# Patient Record
Sex: Female | Born: 1975
Health system: Southern US, Community
[De-identification: ages and names within clinical notes are randomized; demographics above are authoritative.]

## PROBLEM LIST (undated history)

## (undated) DIAGNOSIS — K5900 Constipation, unspecified: Secondary | ICD-10-CM

## (undated) DIAGNOSIS — L0390 Cellulitis, unspecified: Secondary | ICD-10-CM

## (undated) DIAGNOSIS — M2550 Pain in unspecified joint: Secondary | ICD-10-CM

## (undated) DIAGNOSIS — M549 Dorsalgia, unspecified: Secondary | ICD-10-CM

## (undated) DIAGNOSIS — I10 Essential (primary) hypertension: Secondary | ICD-10-CM

## (undated) DIAGNOSIS — E7800 Pure hypercholesterolemia, unspecified: Secondary | ICD-10-CM

## (undated) DIAGNOSIS — E663 Overweight: Secondary | ICD-10-CM

## (undated) DIAGNOSIS — F419 Anxiety disorder, unspecified: Secondary | ICD-10-CM

## (undated) HISTORY — DX: Pain in unspecified joint: M25.50

## (undated) HISTORY — DX: Overweight: E66.3

## (undated) HISTORY — DX: Dorsalgia, unspecified: M54.9

## (undated) HISTORY — DX: Constipation, unspecified: K59.00

## (undated) HISTORY — DX: Pure hypercholesterolemia, unspecified: E78.00

## (undated) HISTORY — PX: WISDOM TOOTH EXTRACTION: SHX21

---

## 1999-03-02 ENCOUNTER — Emergency Department (HOSPITAL_COMMUNITY): Admission: EM | Admit: 1999-03-02 | Discharge: 1999-03-02 | Payer: Self-pay | Admitting: Emergency Medicine

## 1999-10-09 ENCOUNTER — Other Ambulatory Visit: Admission: RE | Admit: 1999-10-09 | Discharge: 1999-10-09 | Payer: Self-pay | Admitting: Obstetrics and Gynecology

## 2000-02-03 ENCOUNTER — Other Ambulatory Visit: Admission: RE | Admit: 2000-02-03 | Discharge: 2000-02-03 | Payer: Self-pay | Admitting: *Deleted

## 2000-07-23 ENCOUNTER — Other Ambulatory Visit: Admission: RE | Admit: 2000-07-23 | Discharge: 2000-07-23 | Payer: Self-pay | Admitting: Obstetrics and Gynecology

## 2000-09-14 ENCOUNTER — Emergency Department (HOSPITAL_COMMUNITY): Admission: EM | Admit: 2000-09-14 | Discharge: 2000-09-14 | Payer: Self-pay | Admitting: Emergency Medicine

## 2000-09-14 ENCOUNTER — Encounter: Payer: Self-pay | Admitting: Emergency Medicine

## 2000-12-07 ENCOUNTER — Other Ambulatory Visit: Admission: RE | Admit: 2000-12-07 | Discharge: 2000-12-07 | Payer: Self-pay | Admitting: Obstetrics and Gynecology

## 2001-07-04 ENCOUNTER — Encounter: Payer: Self-pay | Admitting: Obstetrics and Gynecology

## 2001-07-04 ENCOUNTER — Ambulatory Visit (HOSPITAL_COMMUNITY): Admission: RE | Admit: 2001-07-04 | Discharge: 2001-07-04 | Payer: Self-pay | Admitting: Obstetrics and Gynecology

## 2001-07-07 ENCOUNTER — Inpatient Hospital Stay (HOSPITAL_COMMUNITY): Admission: AD | Admit: 2001-07-07 | Discharge: 2001-07-07 | Payer: Self-pay | Admitting: Obstetrics and Gynecology

## 2001-07-08 ENCOUNTER — Inpatient Hospital Stay (HOSPITAL_COMMUNITY): Admission: AD | Admit: 2001-07-08 | Discharge: 2001-07-08 | Payer: Self-pay | Admitting: Obstetrics and Gynecology

## 2001-07-11 ENCOUNTER — Inpatient Hospital Stay (HOSPITAL_COMMUNITY): Admission: AD | Admit: 2001-07-11 | Discharge: 2001-07-15 | Payer: Self-pay | Admitting: Obstetrics and Gynecology

## 2001-07-11 ENCOUNTER — Encounter (INDEPENDENT_AMBULATORY_CARE_PROVIDER_SITE_OTHER): Payer: Self-pay

## 2001-08-10 ENCOUNTER — Other Ambulatory Visit: Admission: RE | Admit: 2001-08-10 | Discharge: 2001-08-10 | Payer: Self-pay | Admitting: Obstetrics and Gynecology

## 2001-12-15 ENCOUNTER — Encounter: Admission: RE | Admit: 2001-12-15 | Discharge: 2001-12-15 | Payer: Self-pay | Admitting: Internal Medicine

## 2001-12-15 ENCOUNTER — Encounter: Payer: Self-pay | Admitting: Internal Medicine

## 2002-09-08 ENCOUNTER — Other Ambulatory Visit: Admission: RE | Admit: 2002-09-08 | Discharge: 2002-09-08 | Payer: Self-pay | Admitting: Obstetrics and Gynecology

## 2002-09-22 ENCOUNTER — Emergency Department (HOSPITAL_COMMUNITY): Admission: EM | Admit: 2002-09-22 | Discharge: 2002-09-23 | Payer: Self-pay | Admitting: Emergency Medicine

## 2002-09-23 ENCOUNTER — Encounter: Payer: Self-pay | Admitting: Emergency Medicine

## 2003-10-17 ENCOUNTER — Other Ambulatory Visit: Admission: RE | Admit: 2003-10-17 | Discharge: 2003-10-17 | Payer: Self-pay | Admitting: Obstetrics and Gynecology

## 2004-02-14 ENCOUNTER — Ambulatory Visit: Payer: Self-pay | Admitting: Family Medicine

## 2005-03-16 ENCOUNTER — Other Ambulatory Visit: Admission: RE | Admit: 2005-03-16 | Discharge: 2005-03-16 | Payer: Self-pay | Admitting: Obstetrics and Gynecology

## 2005-06-02 ENCOUNTER — Ambulatory Visit: Payer: Self-pay | Admitting: Family Medicine

## 2005-11-20 ENCOUNTER — Ambulatory Visit: Payer: Self-pay | Admitting: Family Medicine

## 2005-12-10 ENCOUNTER — Inpatient Hospital Stay (HOSPITAL_COMMUNITY): Admission: AD | Admit: 2005-12-10 | Discharge: 2005-12-10 | Payer: Self-pay | Admitting: Obstetrics and Gynecology

## 2006-10-25 ENCOUNTER — Inpatient Hospital Stay (HOSPITAL_COMMUNITY): Admission: AD | Admit: 2006-10-25 | Discharge: 2006-10-25 | Payer: Self-pay | Admitting: Obstetrics and Gynecology

## 2006-11-17 ENCOUNTER — Inpatient Hospital Stay (HOSPITAL_COMMUNITY): Admission: AD | Admit: 2006-11-17 | Discharge: 2006-11-17 | Payer: Self-pay | Admitting: Obstetrics and Gynecology

## 2006-11-19 ENCOUNTER — Inpatient Hospital Stay (HOSPITAL_COMMUNITY): Admission: AD | Admit: 2006-11-19 | Discharge: 2006-11-19 | Payer: Self-pay | Admitting: Obstetrics and Gynecology

## 2006-11-26 ENCOUNTER — Inpatient Hospital Stay (HOSPITAL_COMMUNITY): Admission: AD | Admit: 2006-11-26 | Discharge: 2006-11-26 | Payer: Self-pay | Admitting: Obstetrics and Gynecology

## 2006-11-30 ENCOUNTER — Inpatient Hospital Stay (HOSPITAL_COMMUNITY): Admission: AD | Admit: 2006-11-30 | Discharge: 2006-12-03 | Payer: Self-pay | Admitting: Obstetrics and Gynecology

## 2006-11-30 ENCOUNTER — Encounter (INDEPENDENT_AMBULATORY_CARE_PROVIDER_SITE_OTHER): Payer: Self-pay | Admitting: Obstetrics and Gynecology

## 2007-04-09 ENCOUNTER — Emergency Department (HOSPITAL_COMMUNITY): Admission: EM | Admit: 2007-04-09 | Discharge: 2007-04-09 | Payer: Self-pay | Admitting: Emergency Medicine

## 2007-12-08 ENCOUNTER — Emergency Department (HOSPITAL_BASED_OUTPATIENT_CLINIC_OR_DEPARTMENT_OTHER): Admission: EM | Admit: 2007-12-08 | Discharge: 2007-12-09 | Payer: Self-pay | Admitting: Emergency Medicine

## 2008-03-26 ENCOUNTER — Emergency Department (HOSPITAL_COMMUNITY): Admission: EM | Admit: 2008-03-26 | Discharge: 2008-03-26 | Payer: Self-pay | Admitting: Emergency Medicine

## 2009-06-04 ENCOUNTER — Emergency Department (HOSPITAL_BASED_OUTPATIENT_CLINIC_OR_DEPARTMENT_OTHER): Admission: EM | Admit: 2009-06-04 | Discharge: 2009-06-04 | Payer: Self-pay | Admitting: Emergency Medicine

## 2009-08-21 ENCOUNTER — Ambulatory Visit: Payer: Self-pay | Admitting: Obstetrics and Gynecology

## 2009-08-21 ENCOUNTER — Inpatient Hospital Stay (HOSPITAL_COMMUNITY): Admission: AD | Admit: 2009-08-21 | Discharge: 2009-08-21 | Payer: Self-pay | Admitting: Obstetrics and Gynecology

## 2009-11-19 ENCOUNTER — Observation Stay (HOSPITAL_COMMUNITY): Admission: AD | Admit: 2009-11-19 | Discharge: 2009-11-20 | Payer: Self-pay | Admitting: Obstetrics and Gynecology

## 2010-01-29 LAB — CBC
HCT: 33.3 % — ABNORMAL LOW (ref 36.0–46.0)
Hemoglobin: 10.8 g/dL — ABNORMAL LOW (ref 12.0–15.0)
MCH: 27.2 pg (ref 26.0–34.0)
MCHC: 32.4 g/dL (ref 30.0–36.0)
MCV: 83.9 fL (ref 78.0–100.0)
Platelets: 306 10*3/uL (ref 150–400)
RBC: 3.97 MIL/uL (ref 3.87–5.11)
RDW: 14.7 % (ref 11.5–15.5)
WBC: 8.1 10*3/uL (ref 4.0–10.5)

## 2010-01-29 LAB — SURGICAL PCR SCREEN
MRSA, PCR: NEGATIVE
Staphylococcus aureus: POSITIVE — AB

## 2010-01-29 LAB — RPR: RPR Ser Ql: NONREACTIVE

## 2010-02-04 ENCOUNTER — Inpatient Hospital Stay (HOSPITAL_COMMUNITY)
Admission: RE | Admit: 2010-02-04 | Discharge: 2010-02-07 | Payer: Self-pay | Source: Home / Self Care | Attending: Obstetrics and Gynecology | Admitting: Obstetrics and Gynecology

## 2010-02-05 LAB — CBC
HCT: 28.3 % — ABNORMAL LOW (ref 36.0–46.0)
Hemoglobin: 9.2 g/dL — ABNORMAL LOW (ref 12.0–15.0)
MCH: 26.9 pg (ref 26.0–34.0)
MCHC: 32.5 g/dL (ref 30.0–36.0)
MCV: 82.7 fL (ref 78.0–100.0)
Platelets: 273 10*3/uL (ref 150–400)
RBC: 3.42 MIL/uL — ABNORMAL LOW (ref 3.87–5.11)
RDW: 14.8 % (ref 11.5–15.5)
WBC: 9.7 10*3/uL (ref 4.0–10.5)

## 2010-02-05 LAB — TYPE AND SCREEN
ABO/RH(D): A POS
Antibody Screen: NEGATIVE

## 2010-02-05 LAB — ABO/RH: ABO/RH(D): A POS

## 2010-02-10 NOTE — Op Note (Signed)
  NAMEIVIE, SAVITT            ACCOUNT NO.:  [id number]  MEDICAL RECORD NO.:  [id number]          PATIENT TYPE:  INP  LOCATION:  9144                          FACILITY:  [facility]  PHYSICIAN:  Lenea Bywater L. Savahanna Almendariz, M.D.DATE OF BIRTH:  11-Jan-1976  DATE OF PROCEDURE:  02/04/2010 DATE OF DISCHARGE:                              OPERATIVE REPORT   PREOPERATIVE DIAGNOSES:  Intrauterine pregnancy at 39 weeks, previous C- section x2.  POSTOPERATIVE DIAGNOSES:  Intrauterine pregnancy at 39 weeks, previous C- section x2.  PROCEDURE:  Repeat low transverse cesarean section.  SURGEON:  Yovany Clock L. Lenard Kampf, MD  ANESTHESIA:  Spinal.  FINDINGS:  Female infant, Apgar 6 at 1 minute, 8 at 5 minutes, and 9 at 10 minutes.  SPECIMENS:  None.  PATHOLOGY:  None.  ESTIMATED BLOOD LOSS:  500 mL.  COMPLICATIONS:  None.  PROCEDURE:  The patient was taken to the operating room where spinal was placed.  She was then prepped and draped.  A Foley catheter was inserted.  A low transverse incision was made, carried down to the fascia.  Fascia was scored in the midline and extended laterally.  The rectus muscles were separated in midline.  The peritoneum was entered bluntly.  The peritoneal incision was then stretched.  Of note, there was significant scarring noted in the fascia and rectus muscles from her previous C-section, so she did not have any adhesions involving the uterus.  The bladder blade was inserted.  The lower uterine segment was identified and a bladder flap was created sharply and then digitally.  A low transverse incision was made in the uterus.  Uterus was entered using a hemostat.  The baby was in cephalic presentation.  Amniotomy revealed clear amniotic fluid.  The baby was delivered easily without any difficulty with a female infant, Apgars 6 at 1 minute, 8 at 5 minutes, and 9 at 10 minutes.  The baby was handed to the awaiting neonatal team and taken to the newborn nursery.  The uterus  was exteriorized.  It was cleared of all clots and debris after the placenta was manually removed and noted to be normal intact with a 3-vessel cord.  Antibiotics and Pitocin were given.  Uterus was then closed in 2 layers using O chromic in a running locked stitch.  The uterus was returned to the abdomen. Irrigation was performed.  Hemostasis was excellent.  The peritoneum was reapproximated using 0 Vicryl.  The fascia was then closed using 0 Vicryl and running stitch starting each corner meeting in the midline with a running stitch.  After irrigation of subcutaneous layer, the skin was closed with staples.  All sponge, lap, and instrument counts were correct x2.  The patient went to recovery room in stable condition.     Romero Letizia L. Vincente Poli, M.D.     Florestine Avers  D:  02/04/2010  T:  02/04/2010  Job:  [id number]  Electronically Signed by Marcelle Overlie M.D. on 02/10/2010 02:24:43 PM

## 2010-03-05 NOTE — Discharge Summary (Signed)
  NAMEJERRA, Diane [name]            ACCOUNT NO.:  [id number]  MEDICAL RECORD NO.:  [id number]        PATIENT TYPE:  WINP  LOCATION:                                FACILITY:  [facility]  PHYSICIAN:  Julio Sicks, N.P.     DATE OF BIRTH:  May 02, 1975  DATE OF ADMISSION:  02/04/2010 DATE OF DISCHARGE:  02/07/2010                              DISCHARGE SUMMARY   ADMITTING DIAGNOSES: 1. Intrauterine pregnancy at 42 weeks' estimated gestational age. 2. Previous cesarean section x2, desires repeat.  DISCHARGE DIAGNOSES: 1. Status post low transverse cesarean section. 2. Viable female infant.  PROCEDURE:  Repeat low transverse cesarean section.  REASON FOR ADMISSION:  Please see written H and P.  HOSPITAL COURSE:  The patient is a 35 year old gravida 6, para 2 that was admitted to [facility] for scheduled cesarean section.  The patient had had 2 previous cesarean deliveries and desired repeat.  Her obstetrical history had been uncomplicated.  On the morning of admission, the patient was taken to the operating room where spinal anesthesia was administered without difficulty.  A low transverse incision was made with delivery of a viable female infant, weighing 6 pounds and 9 ounces, with Apgars of 6 at 1 minute and 8 at 5 minutes. The patient tolerated the procedure well and was taken to the recovery room in stable condition.  On postoperative day #1, the patient was without complaint.  Vital signs were stable.  Abdomen was soft.  Fundus was firm and nontender.  Small amount of drainage was noted on the bandage.  Foley had been discontinued.  She was voiding well. Laboratory findings showed hemoglobin of 9.2, platelet count of 273,000. On postoperative day #2, the patient was without complaint.  Vital signs were stable.  Abdomen was soft.  Fundus was firm and nontender. Incision was clean, dry, and intact.  Staples were intact.  She was ambulating well and tolerating a regular  diet.  On postoperative day #3, the patient was without complaint.  Vital signs were stable.  Abdomen was soft.  Fundus was firm and nontender.  Incision was clean, dry, and intact.  Staples were removed.  The patient was later discharged home.  CONDITION ON DISCHARGE:  Stable.  DIET:  Regular as tolerated.  ACTIVITY:  No heavy lifting, no driving x2 weeks, and no vaginal entry.  FOLLOWUP:  The patient is to follow up in the office in 1-2 weeks for an incision check.  She is to call for temperature greater than 100 degrees, persistent nausea, vomiting, heavy vaginal bleeding, and/or redness or drainage from an incisional site.  DISCHARGE MEDICATIONS: 1. Tylox #30 one p.o. every 4-6 hours p.r.n. 2. Motrin 600 mg every 6 hours. 3. Prenatal vitamins 1 p.o. daily. 4. Colace 1 p.o. daily.     Julio Sicks, N.P.     CC/MEDQ  D:  02/07/2010  T:  02/07/2010  Job:  [id number]  Electronically Signed by Julio Sicks N.P. on 02/11/2010 04:01:09 PM Electronically Signed by Richarda Overlie M.D. on 03/05/2010 06:39:02 PM

## 2010-03-25 LAB — CBC
HCT: 32.4 % — ABNORMAL LOW (ref 36.0–46.0)
Hemoglobin: 10.9 g/dL — ABNORMAL LOW (ref 12.0–15.0)
MCH: 29.8 pg (ref 26.0–34.0)
MCHC: 33.7 g/dL (ref 30.0–36.0)
MCV: 88.5 fL (ref 78.0–100.0)
Platelets: 335 10*3/uL (ref 150–400)
RBC: 3.66 MIL/uL — ABNORMAL LOW (ref 3.87–5.11)
RDW: 14 % (ref 11.5–15.5)
WBC: 8.6 10*3/uL (ref 4.0–10.5)

## 2010-03-28 LAB — URINALYSIS, ROUTINE W REFLEX MICROSCOPIC
Bilirubin Urine: NEGATIVE
Glucose, UA: NEGATIVE mg/dL
Hgb urine dipstick: NEGATIVE
Ketones, ur: NEGATIVE mg/dL
Nitrite: NEGATIVE
Protein, ur: NEGATIVE mg/dL
Specific Gravity, Urine: 1.025 (ref 1.005–1.030)
Urobilinogen, UA: 0.2 mg/dL (ref 0.0–1.0)
pH: 6 (ref 5.0–8.0)

## 2010-04-08 ENCOUNTER — Emergency Department (HOSPITAL_BASED_OUTPATIENT_CLINIC_OR_DEPARTMENT_OTHER)
Admission: EM | Admit: 2010-04-08 | Discharge: 2010-04-08 | Disposition: A | Discharge status: Home / Self Care | Payer: Medicaid Other | Attending: Emergency Medicine | Admitting: Emergency Medicine

## 2010-04-08 DIAGNOSIS — H5789 Other specified disorders of eye and adnexa: Secondary | ICD-10-CM | POA: Insufficient documentation

## 2010-04-08 DIAGNOSIS — H109 Unspecified conjunctivitis: Secondary | ICD-10-CM | POA: Insufficient documentation

## 2010-05-27 NOTE — Op Note (Signed)
Diane [name], [name]            ACCOUNT NO.:  [id number]   MEDICAL RECORD NO.:  [id number]          PATIENT TYPE:  INP   LOCATION:  9105                          FACILITY:  [facility]   PHYSICIAN:  Guy Sandifer. Henderson Cloud, M.D. DATE OF BIRTH:  September 10, 1975   DATE OF PROCEDURE:  11/30/2006  DATE OF DISCHARGE:                               OPERATIVE REPORT   PREOPERATIVE DIAGNOSES:  1. Intrauterine pregnancy at 38-5/7 weeks estimated gestational age.  2. Pregnancy-induced hypertension.  3. Previous cesarean section, desires repeat.   POSTOPERATIVE DIAGNOSES:  1. Intrauterine pregnancy at 38-5/7 weeks estimated gestational age.  2. Pregnancy-induced hypertension.  3. Previous cesarean section, desires repeat.   PROCEDURE:  Repeat low transverse cesarean section.   SURGEON:  Harold Hedge, MD   ANESTHESIA:  Queen Blossom, MD.   ESTIMATED BLOOD LOSS:  800 mL.   FINDINGS:  A viable female infant, Apgars of 9 and 9 at one and five  minutes respectively.  Birth weight 8 pounds 0 ounces.  Arterial cord pH  7.23.   INDICATIONS AND CONSENT:  This patient is a 35 year old black female G2,  P1 at 38-5/7 weeks with elevated blood pressures in the 140s-150s/90s-  100.  She has a mild headache.  Laboratories were within normal limits.  She desires repeat cesarean section.  Delivery is recommended.  Potential risks and complications were discussed preoperatively  including but limited to infection, organ damage, bleeding with  transfusion of blood products with HIV and hepatitis acquisition, DVT,  PE, pneumonia.  All questions have been answered and consent is signed  and on the chart.   PROCEDURE:  The patient is taken to the operating room where she is  identified, a spinal anesthetic is placed and she is placed in the  dorsosupine position with a 15 degree left lateral wedge.  The  panniculus was elevated with tape.  The patient is then prepped, Foley  catheter is placed and the bladder is  drained in straight sterile  fashion.  After testing for adequate spinal anesthesia skin is entered  through a Pfannenstiel incision and dissection is carried out in layers  to the peritoneum.  There are adhesions of the omentum to the anterior  abdominal wall which are easily taken down and superior to the area of  surgery.   The vesicouterine peritoneum was then taken down cephalad laterally.  The bladder flap was developed and the bladder blade was placed.  The  uterus is incised in a low transverse manner.  The uterine cavity is  entered bluntly with a hemostat.  The uterine incision is extended  cephalad laterally with fingers.  Artificial rupture of membranes for  clear fluid is then carried out.  Vertex is delivered without difficulty  and the oral and nasopharynx were suctioned.  The remainder of the baby  is delivered without difficulty.  Good cry and tone is noted.  Cord is  clamped and cut and the baby is handed to the waiting pediatrics team.   The placenta is then manually extracted and sent to pathology.  The  uterine cavity is clean.  The uterus is  closed in two running locking  imbricating layers of zero Monocryl suture which achieves good  hemostasis.  Tubes and ovaries palpate normally bilaterally.  There is a  closed loop formed by a narrow adhesion of the omentum to the anterior  peritoneum.  This was taken down with cautery well clear of the bowel.  Good hemostasis was noted.  Irrigation is carried out.  The anterior  peritoneum was closed in running fashion with zero Monocryl suture which  was also used to reapproximate the pyramidalis muscle in the midline.  The anterior rectus fascia is closed in a running fashion with zero PDS  suture.  A JP drain was then placed in the subcutaneous tissues and  exited lateral to the left angle of the incision.  It is sewn into place  with a 2-0 silk suture.  The skin is closed with clips.  A pressure  dressing is applied.  All  sponge, instrument and needle counts were  correct and the patient is transferred to the recovery room in stable  condition.      Guy Sandifer Henderson Cloud, M.D.  Electronically Signed     JET/MEDQ  D:  11/30/2006  T:  12/01/2006  Job:  [id number]

## 2010-05-30 NOTE — H&P (Signed)
[facility]  Patient:    Diane [name], Diane [name] Visit Number: [id number] MRN: [id number]          Service Type: OBS Location: 910B 9158 01 Attending Physician:  Rhina Brackett Dictated by:   Duke Salvia. Marcelle Overlie, M.D. Admit Date:  07/11/2001                           History and Physical  REASON FOR ADMISSION:         For labor induction secondary to PIH.  HISTORY OF PRESENT ILLNESS:   A 35 year old G2 P0; EDD is July 13, 2001; presents at term for labor induction.  She was scheduled for later this week but presented to the office today complaining of headache and some mild epigastric discomfort, no other visual changes, and was noted to have significant lower extremity edema with BP 140/100, and is admitted for induction.  LABORATORY DATA:              A positive.  Rubella titer immune.  Group B strep screen was positive.  One-hour GTT 74.  PAST MEDICAL HISTORY:  ALLERGIES:                    None.  OPERATIONS:                   TAB.  REVIEW OF SYSTEMS:            She had treated GC at age 3.  PHYSICAL EXAMINATION:  VITAL SIGNS:                  Temperature 98.2, blood pressure 140/100, weight 272.  HEENT:                        Unremarkable.  NECK:                         Supple without masses.  LUNGS:                        Clear.  CARDIOVASCULAR:               Regular rate and rhythm without murmurs, rubs, or gallops noted.  BREASTS:                      Not examined.  ABDOMEN:                      Term fundal height.  Fetal heart rate 140.  EXTREMITIES:                  Revealed 2+ lower extremity edema.  Reflexes were 1-2+, no clonus.  PELVIC:                       The cervix was 1 cm, 25-50%, membranes intact, vertex.  IMPRESSION:                   1. Term intrauterine pregnancy.                               2. Mild pregnancy-induced hypertension.  PLAN:  Will admit for two-stage labor  induction.  Will recheck lab data, continuous fetal monitoring, and treat with ampicillin in labor. Dictated by:   Duke Salvia. Marcelle Overlie, M.D. Attending Physician:  Rhina Brackett DD:  07/11/01 TD:  07/11/01 Job: [id number]

## 2010-05-30 NOTE — Discharge Summary (Signed)
Diane [name], Diane [name]            ACCOUNT NO.:  [id number]   MEDICAL RECORD NO.:  [id number]          PATIENT TYPE:  INP   LOCATION:  9105                          [facility]AC[facility]   PHYSICIAN:  Juluis Mire, M.D.   DATE OF BIRTH:  [birth year]   DATE OF ADMISSION:  11/30/2006  DATE OF DISCHARGE:  12/03/2006                               DISCHARGE SUMMARY   ADMITTING DIAGNOSES:  1. Intrauterine pregnancy at 38-5/7 weeks estimated gestational age.  2. Previous cesarean section, desires repeat.   DISCHARGE DIAGNOSES:  1. Status post low transverse cesarean section.  2. A viable female infant.   PROCEDURE:  Repeat low transverse cesarean section.   REASON FOR ADMISSION:  Please see written H&P.   HOSPITAL COURSE:  The patient is a 35 year old black married female  gravida 2, para 1 that was admitted to [facility] at 38-  5/7 weeks estimated gestational age for scheduled cesarean section.  The  patient had had a previous cesarean delivery and desired repeat.  On the  morning of admission the patient was taken to the operating room where  spinal anesthesia was administered without difficulty.  A low transverse  incision was made with delivery of a viable female infant weighing 8  pounds 2 ounces, Apgars of 9 at one minute and 9 at five minutes.  The  patient tolerated the procedure well and was taken to the recovery room  in stable condition.   Postoperative day #1 the patient did complain of some itching.  Vital  signs were stable.  She was afebrile.  Blood pressure was 121/67 and  121/75.  Abdomen soft with good return of bowel function.  Fundus was  firm and nontender.  Abdominal dressing was noted to be clean, dry and  intact.  The patient had had a Jackson-Pratt that was inserted during  the delivery at the incisional site with less than 5 mL noted in the  drainage bottle.  Foley was draining with adequate amount of urine  output.   Laboratories revealed  hemoglobin of 9.5, platelet count of 259,000, WBC  count of 9.9.  On postoperative day #2 the patient did complain of some  productive cough which was clear.  Vital signs were stable.  She was  afebrile.  Lungs were clear to auscultation.  Abdomen soft.  Fundus firm  and nontender.  Incision was clean, dry and intact.  Jackson-Pratt  remained.  She was voiding well and ambulating well.  The patient was  started on Tessalon Perles for a productive cough.   On postoperative day #3 the patient was without complaints.  Vital signs  were stable.  She was afebrile.  Fundus firm and nontender.  Incision  was clean, dry and intact.  Jackson-Pratt was removed.  Discharge  instructions were given and the patient was later discharged home.   CONDITION ON DISCHARGE:  Stable.   DIET:  Regular as tolerated.   ACTIVITY:  No heavy lifting, no driving x2 weeks, no vaginal entry.   FOLLOW UP:  The patient to follow up in the office in 1 week for an  incision check.  She is to call for temperature greater than 100  degrees, persistent nausea or vomiting, heavy vaginal bleeding and/or  redness or drainage from incisional site.   DISCHARGE MEDICATION:  1. Tylox #30 one p.o. every 4-6 hours p.r.n.  2. Motrin 600 mg every 6 hours.  3. Prenatal vitamins one p.o. daily.  4. Colace one p.o. daily p.r.n.      Julio Sicks, N.P.      Juluis Mire, M.D.  Electronically Signed    CC/MEDQ  D:  12/29/2006  T:  12/29/2006  Job:  [id number]

## 2010-05-30 NOTE — Op Note (Signed)
[facility]  Patient:    Diane [name], Diane [name] Visit Number: [id number] MRN: [id number]          Service Type: OBS Location: 910A 9113 01 Attending Physician:  Rhina Brackett Dictated by:   Willey Blade, M.D. Proc. Date: 07/12/01 Admit Date:  07/11/2001                             Operative Report  PREOPERATIVE DIAGNOSES:       1. Intrauterine pregnancy at term.                               2. Cephalopelvic disproportion.  POSTOPERATIVE DIAGNOSES:      1. Intrauterine pregnancy at term.                               2. Cephalopelvic disproportion.  PROCEDURE:                    Primary low transverse cesarean section.  SURGEON:                      Willey Blade, M.D.  ANESTHESIA:                   Epidural.  EBL:                          800 cc.  COMPLICATIONS:                None.  FINDINGS:                     At 2009 h through a low transverse uterine incision, a viable female infant was delivered from the vertex presentation. Apgars were 8 and 9.  The baby did well.  The baby was a female weighing 7 pounds 3 ounces.  A posterior pedunculated uterine fibroid was removed; this was approximately 4 cm in size and in the middle of the posterior aspect of the uterus.  Due to its pedunculated nature, I did remove this.  The adnexa was visualized at time of surgery and noted to be normal.  DESCRIPTION OF PROCEDURE:     The patient was taken to the operating room, where an adequate level of epidural anesthetic was administered.  The patient was placed on the operating table in the left lateral tilt position.  The abdomen was prepped and draped in the usual sterile fashion with Betadine and sterile drapes.  A Foley catheter had previously been inserted.  The skin was numbed with approximately 18 cc of 0.25% Marcaine and the abdomen was then entered through a low transverse incision sharply and in the usual fashion. The peritoneum was  atraumatically entered.  The vesicouterine peritoneum overlying the lower uterine segment was incised, and a bladder flap was bluntly and sharply created over the lower uterine segment.  A bladder blade was then placed behind the bladder to ensure its protection during the procedure.  The uterus was then entered through a low transverse incision, and carried out laterally using the operators fingers.  The vertex was elevated into the incision and delivered promptly and easily at 2209.  The oronasopharynx was slowly bulb suctioned and the cord doubly clamped and cut.  The baby was handed promptly to the pediatricians. The baby was a female infant weighing 7 pounds 3 ounces.  The baby did well. Apgars were 8 and 9, as mentioned.  The baby did well.  The placenta was then manually extracted intact with three-vessel cord without difficulty.  The interior of the uterus was wiped clean thoroughly with a wet sponge.  The uterine incision was then closed in a two-layer fashion; the first layer a running interlocking suture of #1 Vicryl, a second embrocating suture was placed across the primary suture line with a running stitch of #1 Vicryl as well.  The pelvis was then thoroughly irrigated with copious amounts of irrigant.  Attention was then turned to a posterior fibroid.  There was noted a pedunculated posterior uterine fibroid, approximately 4 cm in size.  This was excised at its base with a bipolar cautery, and the base was then sutured with three interrupted sutures of #1 Vicryl.  This was excised due to its pedunculated nature.  The adnexa was visualized and noted to be normal.  The pelvis was then thoroughly irrigated once again, and thoroughly inspected, with hemostasis noted.  The rectus muscle and anterior peritoneum was then closed with a running stitch of #1 Vicryl.  The subfascial areas were hemostatic.  The fascia was then closed with two sutures of #1 Vicryl in a running fashion.   The subcutaneous tissue was irrigated and made hemostatic using the Bovie cautery.  The skin was reapproximated with staples and a sterile dressing applied.  The patient did receive antibiotic after the cord was clamped.  Final sponge, needle, and instrument count was correct x3.  There were no preoperative complications. Dictated by:   Willey Blade, M.D. Attending Physician:  Rhina Brackett DD:  07/12/01 TD:  07/15/01 Job: [id number]

## 2010-05-30 NOTE — Discharge Summary (Signed)
Diane [name], Diane [name]                      ACCOUNT NO.:  [id number]   MEDICAL RECORD NO.:  [id number]                   PATIENT TYPE:  INP   LOCATION:  9113                                 FACILITY:  [facility]   PHYSICIAN:  Juluis Mire, M.D.                DATE OF BIRTH:  1975-04-30   DATE OF ADMISSION:  07/11/2001  DATE OF DISCHARGE:  07/15/2001                                 DISCHARGE SUMMARY   ADMISSION DIAGNOSES:  1. Intrauterine pregnancy at term.  2. Cephalopelvic disproportion.   DISCHARGE DIAGNOSES:  1. Status post low transverse cesarean section.  2. Viable female infant.  3. Removal of pedunculated uterine fibroid.   PROCEDURE:  Primary low transverse cesarean section.   REASON FOR ADMISSION:  Please see written H&P.   HOSPITAL COURSE:  The patient was admitted for labor induction due to  pregnancy-induced hypertension.  The patient had presented to the office  with complaints of  headache, mild epigastric discomfort.  The patient was  also noted to have significant lower extremity edema and blood pressure of  140/100.  The patient was admitted to the hospital.  Fetal monitor was  placed for fetal surveillance.  PIH labs were obtained.  Episodes of  variable decelerations were noted on fetal monitor strip.  Contractions were  every 3-4 minutes.  Plan for Cytotec was cancelled.  IV antibiotics  were  administered for positive group B-beta strep culture.  On the following  morning, intrauterine pressure catheter was placed.  Fetal heart tones were  reassuring.  Although the uterine pressure catheter noted adequate labor,  cervix never progressed past 5 cm dilated.  Decision was made to proceed  with a cesarean delivery due to cephalopelvic disproportion.  The patient  was taken to the operating room where epidural anesthesia was dosed to  adequate surgical level.  A low transverse incision was made with the  delivery of a viable female infant weighing 7 pounds 3 ounces  with Apgars of 8  at one minute and 9 at five minutes.  The patient was noted to have a  pedunculated fibroid which was removed without difficulty.  The patient  tolerated the procedure well and was taken to the recovery room in stable  condition.  On postoperative day #1, the patient had good return of bowel  function.  Abdominal dressing was clean, dry, and intact.  Uterus was  nontender.  Labs revealed a hemoglobin of 9.9, hematocrit of 29.3, and WBC count of  17,000.  On postoperative #2, abdomen was soft.  Incision was clean, dry,  and intact.  The patient was ambulating without assistance and tolerating a  regular diet without complaints of nausea and vomiting.  On postoperative  day #3, fundus was firm.  Incision was clean, dry, and intact.  Staples were  removed.  The patient was discharged home.   CONDITION ON DISCHARGE:  Good.   DIET:  Regular as tolerated.   ACTIVITY:  No heavy lifting, no driving x2 weeks, no vaginal entry.   FOLLOW UP:  The patient is to follow up in the office in 1-2 weeks for an  incision check.  She is to call for temperature greater than 100 degrees,  persistent nausea or vomiting, heavy vaginal bleeding and/or redness or  drainage from the incision site.   DISCHARGE MEDICATIONS:  1. Tylox (#30) one to two every 4-6 hours p.r.n. pain.  2. Ibuprofen 600 mg every 6 hours p.r.n.  3. Prenatal vitamins one p.o. daily.        Judith Blonder, N.P.                     Juluis Mire, M.D.    CGC/MEDQ  D:  08/09/2001  T:  08/12/2001  Job:  [id number]

## 2010-10-14 LAB — DIFFERENTIAL
Basophils Absolute: 0.2 — ABNORMAL HIGH
Basophils Relative: 2 — ABNORMAL HIGH
Eosinophils Absolute: 0.1
Eosinophils Relative: 1
Lymphocytes Relative: 13
Lymphs Abs: 1.4
Monocytes Absolute: 0.6
Monocytes Relative: 6
Neutro Abs: 8.3 — ABNORMAL HIGH
Neutrophils Relative %: 79 — ABNORMAL HIGH

## 2010-10-14 LAB — BASIC METABOLIC PANEL
BUN: 9
CO2: 27
Calcium: 8.8
Chloride: 101
Creatinine, Ser: 0.6
GFR calc Af Amer: >60
GFR calc non Af Amer: >60
Glucose, Bld: 92
Potassium: 3.8
Sodium: 137

## 2010-10-14 LAB — HCG, QUANTITATIVE, PREGNANCY: hCG, Beta Chain, Quant, S: 1997 — ABNORMAL HIGH

## 2010-10-14 LAB — CBC
HCT: 35.9 — ABNORMAL LOW
Hemoglobin: 12
MCHC: 33.3
MCV: 83.7
Platelets: 304
RBC: 4.29
RDW: 14.8
WBC: 10.6 — ABNORMAL HIGH

## 2010-10-14 LAB — ABO/RH: ABO/RH(D): A POS

## 2010-10-21 LAB — CBC
HCT: 34.2 — ABNORMAL LOW
HCT: 33.1 — ABNORMAL LOW
HCT: 34.4 — ABNORMAL LOW
Hemoglobin: 11.5 — ABNORMAL LOW
Hemoglobin: 9.5 — ABNORMAL LOW
Hemoglobin: 11.4 — ABNORMAL LOW
Hemoglobin: 11.9 — ABNORMAL LOW
MCHC: 33.5
MCHC: 33.6
MCHC: 34.1
MCHC: 34.3
MCHC: 34.5
MCV: 87
MCV: 87.6
MCV: 85.8
MCV: 86.6
Platelets: 308
Platelets: 349
RBC: 3.22 — ABNORMAL LOW
RBC: 3.93
RBC: 3.86 — ABNORMAL LOW
RBC: 4.01
RDW: 14.3
RDW: 13.7
WBC: 7.7

## 2010-10-21 LAB — URINE MICROSCOPIC-ADD ON

## 2010-10-21 LAB — COMPREHENSIVE METABOLIC PANEL
ALT: 16
ALT: 16
AST: 22
AST: 20
AST: 21
Albumin: 2.3 — ABNORMAL LOW
Alkaline Phosphatase: 78
Alkaline Phosphatase: 87
BUN: 3 — ABNORMAL LOW
BUN: 1 — ABNORMAL LOW
BUN: 3 — ABNORMAL LOW
CO2: 24
CO2: 23
CO2: 25
CO2: 27
Calcium: 8.8
Calcium: 8.8
Calcium: 9
Calcium: 9.4
Chloride: 105
Creatinine, Ser: 0.59
Creatinine, Ser: 0.52
Creatinine, Ser: 0.54
GFR calc Af Amer: >60
GFR calc Af Amer: >60
GFR calc Af Amer: >60
GFR calc non Af Amer: >60
GFR calc non Af Amer: >60
GFR calc non Af Amer: >60
Glucose, Bld: 82
Glucose, Bld: 81
Glucose, Bld: 84
Potassium: 4.2
Potassium: 3.8
Sodium: 135
Sodium: 136
Total Bilirubin: 0.5
Total Protein: 6.4
Total Protein: 6.1
Total Protein: 6.7

## 2010-10-21 LAB — URIC ACID
Uric Acid, Serum: 5.2
Uric Acid, Serum: 4.9
Uric Acid, Serum: 5.2

## 2010-10-21 LAB — URINALYSIS, ROUTINE W REFLEX MICROSCOPIC
Glucose, UA: NEGATIVE
Nitrite: NEGATIVE
Protein, ur: 30 — AB
Urobilinogen, UA: 0.2

## 2010-10-21 LAB — LACTATE DEHYDROGENASE
LDH: 162
LDH: 154
LDH: 168

## 2010-10-23 LAB — URINALYSIS, ROUTINE W REFLEX MICROSCOPIC
Bilirubin Urine: NEGATIVE
Glucose, UA: NEGATIVE
Hgb urine dipstick: NEGATIVE
Specific Gravity, Urine: 1.015

## 2011-11-02 ENCOUNTER — Encounter: Payer: Self-pay | Admitting: Family

## 2011-11-02 ENCOUNTER — Ambulatory Visit (HOSPITAL_BASED_OUTPATIENT_CLINIC_OR_DEPARTMENT_OTHER)
Admission: RE | Admit: 2011-11-02 | Discharge: 2011-11-02 | Disposition: A | Discharge status: Home / Self Care | Payer: BC Managed Care – PPO | Source: Ambulatory Visit | Attending: Family | Admitting: Family

## 2011-11-02 ENCOUNTER — Ambulatory Visit (INDEPENDENT_AMBULATORY_CARE_PROVIDER_SITE_OTHER): Payer: BC Managed Care – PPO | Admitting: Family

## 2011-11-02 ENCOUNTER — Other Ambulatory Visit: Payer: Self-pay | Admitting: Family

## 2011-11-02 VITALS — BP 138/90 | HR 83 | Temp 98.5°F | Resp 18 | Ht 59.5 in | Wt 269.0 lb

## 2011-11-02 DIAGNOSIS — Z0000 Encounter for general adult medical examination without abnormal findings: Secondary | ICD-10-CM

## 2011-11-02 DIAGNOSIS — R51 Headache: Secondary | ICD-10-CM

## 2011-11-02 DIAGNOSIS — R5381 Other malaise: Secondary | ICD-10-CM

## 2011-11-02 DIAGNOSIS — Z1231 Encounter for screening mammogram for malignant neoplasm of breast: Secondary | ICD-10-CM | POA: Insufficient documentation

## 2011-11-02 DIAGNOSIS — R519 Headache, unspecified: Secondary | ICD-10-CM | POA: Insufficient documentation

## 2011-11-02 DIAGNOSIS — R030 Elevated blood-pressure reading, without diagnosis of hypertension: Secondary | ICD-10-CM

## 2011-11-02 DIAGNOSIS — G44309 Post-traumatic headache, unspecified, not intractable: Secondary | ICD-10-CM

## 2011-11-02 DIAGNOSIS — IMO0001 Reserved for inherently not codable concepts without codable children: Secondary | ICD-10-CM

## 2011-11-02 DIAGNOSIS — Z720 Tobacco use: Secondary | ICD-10-CM

## 2011-11-02 DIAGNOSIS — G4710 Hypersomnia, unspecified: Secondary | ICD-10-CM

## 2011-11-02 DIAGNOSIS — F17200 Nicotine dependence, unspecified, uncomplicated: Secondary | ICD-10-CM

## 2011-11-02 DIAGNOSIS — R5383 Other fatigue: Secondary | ICD-10-CM

## 2011-11-02 DIAGNOSIS — R400 Somnolence: Secondary | ICD-10-CM

## 2011-11-02 DIAGNOSIS — S0990XS Unspecified injury of head, sequela: Secondary | ICD-10-CM

## 2011-11-02 LAB — CBC WITH DIFFERENTIAL/PLATELET
Eosinophils Absolute: 0.1 10*3/uL (ref 0.0–0.7)
Eosinophils Relative: 1 % (ref 0–5)
HCT: 36.9 % (ref 36.0–46.0)
Hemoglobin: 11.8 g/dL — ABNORMAL LOW (ref 12.0–15.0)
Lymphocytes Relative: 21 % (ref 12–46)
Lymphs Abs: 1.3 10*3/uL (ref 0.7–4.0)
MCH: 25.1 pg — ABNORMAL LOW (ref 26.0–34.0)
MCV: 78.3 fL (ref 78.0–100.0)
Monocytes Absolute: 0.5 10*3/uL (ref 0.1–1.0)
Monocytes Relative: 7 % (ref 3–12)
RBC: 4.71 MIL/uL (ref 3.87–5.11)
WBC: 6.3 10*3/uL (ref 4.0–10.5)

## 2011-11-02 LAB — BASIC METABOLIC PANEL WITH GFR
BUN: 11 mg/dL (ref 6–23)
CO2: 27 mEq/L (ref 19–32)
Chloride: 104 mEq/L (ref 96–112)
GFR, Est Non African American: >89 mL/min
Glucose, Bld: 79 mg/dL (ref 70–99)
Potassium: 4.1 mEq/L (ref 3.5–5.3)

## 2011-11-02 LAB — LIPID PANEL
HDL: 46 mg/dL (ref >39)
LDL Cholesterol: 154 mg/dL — ABNORMAL HIGH (ref 0–99)

## 2011-11-02 LAB — HEPATIC FUNCTION PANEL
AST: 15 U/L (ref 0–37)
Alkaline Phosphatase: 49 U/L (ref 39–117)
Indirect Bilirubin: 0.2 mg/dL (ref 0.0–0.9)
Total Protein: 7.5 g/dL (ref 6.0–8.3)

## 2011-11-02 NOTE — Assessment & Plan Note (Signed)
We discussed some simple changes- stopping juice/soda, fried foods and increasing exercise.

## 2011-11-02 NOTE — Assessment & Plan Note (Signed)
BP is borderline.  Will monitor.  Stressed importance of weight loss, low sodium, exercise.

## 2011-11-02 NOTE — Progress Notes (Signed)
Subjective:    Patient ID: Diane [name], female    DOB: [birth year], 36 y.o.   MRN: [id number]  HPI  [name] is a 36 yr old female who presents today with chief complaint of fatigue.  She reports that her fatigue started after the birth of her son last year.  Son will be 2 in January.  She reports that she does not always sleep well.  Sometimes lays in bed and has trouble sleeping.  Worried about things.  She reports + daytime somnolence.  + snoring.  She denies fam hx of sleep apnea.    Reports heavy periods (+ hx of fibroids)  Period last 5 days.  + clots- 5-6 pads a day.  She reports 30-40 pound weight gain in last 6 months.  Has sedentary job.   Reports frequent headaches 6/10.  Almost every day- stress induced.  She uses BC powder daily.    Elevated blood pressure- not on medication.  Overweight-  Not exercising.  Started walking last night.  Eats fruits/veggies.  + snacking/fried foods.  + soda (pepsi)  Has cut back from 2-3 a day down to 1.    Tobacco abuse- 8 years off/on.  She reports 1/2 PPD smoking.      Review of Systems  Constitutional: Positive for unexpected weight change.  HENT: Negative for hearing loss.   Eyes:       Occasional  Blurry vision  Respiratory: Negative for shortness of breath.   Cardiovascular: Negative for leg swelling.  Gastrointestinal: Negative for nausea, vomiting and diarrhea.  Genitourinary: Negative for dysuria and frequency.       Stress incontinence  Musculoskeletal: Negative for arthralgias.       Some right knee pain  Neurological:       + stress headaches  Hematological: Negative for adenopathy.  Psychiatric/Behavioral:       She reports some depression since the passing of her mom in 2009   Past Medical History  Diagnosis Date  . Elevated blood pressure     History   Social History  . Marital Status: Single    Spouse Name: N/A    Number of Children: 4  . Years of Education: N/A   Occupational History  .       Social History Main Topics  . Smoking status: Current Every Day Smoker -- 0.5 packs/day for 8 years    Types: Cigarettes  . Smokeless tobacco: Never Used  . Alcohol Use: Yes     2-3 glasses of wine on weekend occasionally  . Drug Use: Not on file  . Sexually Active: Not on file   Other Topics Concern  . Not on file   Social History Narrative   [employer]- works from home.  3 children- (2 year old adopted child) 10, 4 and 1. 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 Cesarean section     x 3    Family History  Problem Relation Age of Onset  . Cancer Mother     throat cancer  . Hypertension Mother   . Heart disease Father 28  . Stroke Father   . Cancer Maternal Aunt     breast cancer  . Cancer Maternal Uncle     prostate  . Diabetes Maternal Grandmother   . Kidney disease Neg Hx   . Cancer Maternal Uncle     throat cancer  . Cancer  Maternal Aunt     breast cancer--in remision    No Known Allergies  No current outpatient prescriptions on file prior to visit.    BP 138/90  Pulse 83  Temp 98.5 F (36.9 C) (Oral)  Resp 18  Ht 4' 11.5" (1.511 m)  Wt 269 lb (122.018 kg)  BMI 53.42 kg/m2  SpO2 99%  LMP 11/02/2011       Objective:   Physical Exam  Constitutional: She is oriented to person, place, and time.       Overweight AA female, awake, alert NAD.  Neck: No thyromegaly present.  Cardiovascular: Normal rate and regular rhythm.   No murmur heard. Pulmonary/Chest: Effort normal and breath sounds normal. No respiratory distress. She has no wheezes. She has no rales. She exhibits no tenderness.  Musculoskeletal: She exhibits no edema.  Lymphadenopathy:    She has no cervical adenopathy.  Neurological: She is alert and oriented to person, place, and time.  Skin: Skin is warm and dry. No rash noted. No erythema. No pallor.  Psychiatric: She has a normal mood and  affect. Her behavior is normal. Judgment and thought content normal.          Assessment & Plan:

## 2011-11-02 NOTE — Patient Instructions (Addendum)
Please schedule your mammogram on the first floor.  Complete your blood work prior to leaving.  You will be contact about your referral for the home sleep study. Please let us know if you have not heard back within 1 week about your referral. Try to walk for 30 minutes 5 days a week. Schedule a physical some time in the next 3 months.  Work hard on quitting smoking. Welcome to [facility]!

## 2011-11-02 NOTE — Assessment & Plan Note (Signed)
Pt was counseled on importance of smoking cessation x 5 minutes.

## 2011-11-02 NOTE — Assessment & Plan Note (Signed)
Advised pt against daily use of BC powder.  Recommended PRN tylenol.  If + OSA this could be contributing factor to her headaches.

## 2011-11-02 NOTE — Assessment & Plan Note (Signed)
Will obtain CBC to assess for anemia due to complaint of heavy periods.  Will also send for home sleep study to assess for OSA given + snoring hx and morbid obesity.  Check TSH.

## 2011-11-03 ENCOUNTER — Telehealth: Payer: Self-pay | Admitting: Family

## 2011-11-03 LAB — URINALYSIS, ROUTINE W REFLEX MICROSCOPIC
Leukocytes, UA: NEGATIVE
Nitrite: NEGATIVE
Protein, ur: NEGATIVE mg/dL
pH: 6 (ref 5.0–8.0)

## 2011-11-03 LAB — URINALYSIS, MICROSCOPIC ONLY: Casts: NONE SEEN

## 2011-11-03 NOTE — Telephone Encounter (Signed)
Notified pt of results below. She states she will return for urine recheck at her appt on 11/27/11 @ 1:45pm.   After disconnecting call I realized that the appt for 11/15 had not been scheduled in [facility]. Attempted to reach pt to r/s for an available time as this appt. Is not available and left message for pt to return my call to schedule appt.

## 2011-11-03 NOTE — Telephone Encounter (Signed)
Patient scheduled appointment for 12/04/11.

## 2011-11-03 NOTE — Telephone Encounter (Signed)
Pls call pt and let her know lab work shows mild anemia- Add multivitamin with minerals once daily. Urine shows some microscopic blood. She should repeat UA with reflex micro in 1 month for microscopic hematuria.  She should not come during period. Thyroid is normal, sugar/kidney, electrolytes, liver normal.  Cholesterol mildly elevated.  She should work on low fat/low cholesterol diet.

## 2011-11-23 ENCOUNTER — Ambulatory Visit (HOSPITAL_BASED_OUTPATIENT_CLINIC_OR_DEPARTMENT_OTHER): Payer: BC Managed Care – PPO

## 2011-12-04 ENCOUNTER — Ambulatory Visit: Payer: BC Managed Care – PPO | Admitting: Family

## 2011-12-08 ENCOUNTER — Ambulatory Visit (INDEPENDENT_AMBULATORY_CARE_PROVIDER_SITE_OTHER): Payer: BC Managed Care – PPO | Admitting: Family

## 2011-12-08 ENCOUNTER — Encounter: Payer: Self-pay | Admitting: Family

## 2011-12-08 ENCOUNTER — Other Ambulatory Visit (HOSPITAL_COMMUNITY)
Admission: RE | Admit: 2011-12-08 | Discharge: 2011-12-08 | Disposition: A | Discharge status: Home / Self Care | Payer: BC Managed Care – PPO | Source: Ambulatory Visit | Attending: Family | Admitting: Family

## 2011-12-08 VITALS — BP 138/90 | HR 87 | Temp 98.7°F | Resp 18 | Ht 59.5 in | Wt 271.1 lb

## 2011-12-08 DIAGNOSIS — F17200 Nicotine dependence, unspecified, uncomplicated: Secondary | ICD-10-CM

## 2011-12-08 DIAGNOSIS — Z01419 Encounter for gynecological examination (general) (routine) without abnormal findings: Secondary | ICD-10-CM | POA: Insufficient documentation

## 2011-12-08 DIAGNOSIS — B373 Candidiasis of vulva and vagina: Secondary | ICD-10-CM

## 2011-12-08 DIAGNOSIS — E669 Obesity, unspecified: Secondary | ICD-10-CM

## 2011-12-08 DIAGNOSIS — Z0000 Encounter for general adult medical examination without abnormal findings: Secondary | ICD-10-CM

## 2011-12-08 DIAGNOSIS — Z23 Encounter for immunization: Secondary | ICD-10-CM

## 2011-12-08 DIAGNOSIS — B3731 Acute candidiasis of vulva and vagina: Secondary | ICD-10-CM

## 2011-12-08 DIAGNOSIS — Z720 Tobacco use: Secondary | ICD-10-CM

## 2011-12-08 MED ORDER — FLUCONAZOLE 150 MG PO TABS: 150.0000 mg | ORAL_TABLET | Freq: Once | ORAL | Status: DC

## 2011-12-08 NOTE — Assessment & Plan Note (Signed)
Will rx with diflucan.   

## 2011-12-08 NOTE — Assessment & Plan Note (Signed)
Pt counseled on importance of tobacco cessation.

## 2011-12-08 NOTE — Patient Instructions (Addendum)
Please work hard on diet, exercise and quitting smoking.  Follow up in 6 months.

## 2011-12-08 NOTE — Assessment & Plan Note (Addendum)
Pt counseled on diet, exercise, weight loss and smoking cessation.  Pap performed today.  Pt to arrange apt with Dr. Perlie Gold (GYN) for discussion of tubal ligation) She will continue to use condoms until then.

## 2011-12-08 NOTE — Progress Notes (Signed)
Subjective:    Patient ID: Diane [name], female    DOB: 03/14/1975, 36 y.o.   MRN: [id number]  HPI  Diane [name] is a 36 yr old female who presents today for cpx.  Patient presents today for complete physical.  Immunizations:declines flu, due for tetanus Diet: fair- trying to cut back on fried foods Exercise: has started exercising Pap Smear: due Mammogram: had mammogram last month which was normal.    Contraception- using condoms.  Has tried the mirena x 2.  Did not like it- found it uncomfortable.  Reports that she also has tried the pill but she often forgets to take it.  She continues to smoke. ot.   She reports that she had her period last visit when she provided her urine sample which was + for blood.  Review of Systems  Constitutional: Negative for unexpected weight change.  HENT: Negative for hearing loss and congestion.   Eyes: Negative for visual disturbance.  Respiratory: Negative for cough.   Cardiovascular: Negative for leg swelling.  Gastrointestinal: Positive for constipation. Negative for vomiting and diarrhea.  Genitourinary: Negative for dysuria, frequency and hematuria.  Musculoskeletal: Negative for myalgias.       Reports some left shoulder weakness, no pain  Skin: Negative for rash.  Neurological: Negative for headaches.  Hematological: Does not bruise/bleed easily.  Psychiatric/Behavioral:       Denies depression   Past Medical History  Diagnosis Date  . Elevated blood pressure     History   Social History  . Marital Status: Single    Spouse Name: N/A    Number of Children: 4  . Years of Education: N/A   Occupational History  .     Social History Main Topics  . Smoking status: Current Every Day Smoker -- 0.5 packs/day for 8 years    Types: Cigarettes  . Smokeless tobacco: Never Used  . Alcohol Use: Yes     Comment: 2-3 glasses of wine on weekend occasionally  . Drug Use: Not on file  . Sexually Active: Not on file   Other  Topics Concern  . Not on file   Social History Narrative   [employer]- works from home.  3 children- (38 year old adopted child) 10, 4 and 1. 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 Cesarean section     x 3    Family History  Problem Relation Age of Onset  . Cancer Mother     throat cancer  . Hypertension Mother   . Heart disease Father 9  . Stroke Father   . Cancer Maternal Aunt     breast cancer  . Cancer Maternal Uncle     prostate  . Diabetes Maternal Grandmother   . Kidney disease Neg Hx   . Cancer Maternal Uncle     throat cancer  . Cancer Maternal Aunt     breast cancer--in remision    No Known Allergies  Current Outpatient Prescriptions on File Prior to Visit  Medication Sig Dispense Refill  . Multiple Vitamins-Minerals (MULTIVITAMIN WITH MINERALS) tablet Take 1 tablet by mouth daily.        BP 138/90  Pulse 87  Temp 98.7 F (37.1 C) (Oral)  Resp 18  Ht 4' 11.5" (1.511 m)  Wt 271 lb 1.3 oz (122.961 kg)  BMI 53.84 kg/m2  SpO2 99%  LMP 11/02/2011       Objective:  Physical Exam  Physical Exam  Constitutional: Morbidly obese AA female. She is oriented to person, place, and time. She appears well-developed and well-nourished. No distress.  HENT:  Head: Normocephalic and atraumatic.  Right Ear: Tympanic membrane and ear canal normal.  Left Ear: Tympanic membrane and ear canal normal.  Mouth/Throat: Oropharynx is clear and moist.  Eyes: Pupils are equal, round, and reactive to light. No scleral icterus.  Neck: Normal range of motion. No thyromegaly present.  Cardiovascular: Normal rate and regular rhythm.   No murmur heard. Pulmonary/Chest: Effort normal and breath sounds normal. No respiratory distress. He has no wheezes. She has no rales. She exhibits no tenderness.  Abdominal: Soft. Bowel sounds are normal. He exhibits no distension and no mass. There  is no tenderness. There is no rebound and no guarding.  Musculoskeletal: She exhibits no edema.  Lymphadenopathy:    She has no cervical adenopathy.  Neurological: She is alert and oriented to person, place, and time.  She exhibits normal muscle tone. Coordination normal.  Skin: Skin is warm and dry.  Psychiatric: She has a normal mood and affect. Her behavior is normal. Judgment and thought content normal.  Breasts: Examined lying Right: Without masses, retractions, discharge or axillary adenopathy.  Left: Without masses, retractions, discharge or axillary adenopathy.  Inguinal/mons: Normal without inguinal adenopathy  External genitalia: Normal  BUS/Urethra/Skene's glands: Normal  Bladder: Normal  Vagina: thick white discharge is noted. Cervix: Normal  Uterus: normal in size, shape and contour. Midline and mobile  Adnexa/parametria:  Rt: Without masses or tenderness.  Lt: Without masses or tenderness.  Anus and perineum: Normal           Assessment & Plan:        Assessment & Plan:   BP Readings from Last 3 Encounters:  12/08/11 138/90  11/02/11 138/90

## 2011-12-14 ENCOUNTER — Encounter: Payer: Self-pay | Admitting: Family

## 2011-12-18 ENCOUNTER — Telehealth: Payer: Self-pay | Admitting: *Deleted

## 2011-12-18 NOTE — Telephone Encounter (Signed)
Pt left voice message requesting a return call re: treatment that she has completed for a yeast infection. Continues to have vaginal discharge. Attempted to reach pt and left message to return my call.

## 2011-12-18 NOTE — Telephone Encounter (Signed)
Pt states she completed #1 diflucan and vaginal discharge worsened. Reports that discharge is yellow. Has itching and burning. Pt reports history of trichomonas infection about 1 month ago and [facility]. Reports that symptoms are similar to that time. Advised pt of need to be seen in the office for further testing. Appt. Scheduled for Monday at 9:45am.  Please advise if there are further instructions.

## 2011-12-18 NOTE — Telephone Encounter (Signed)
Agree with plan 

## 2011-12-21 ENCOUNTER — Encounter: Payer: Self-pay | Admitting: Family

## 2011-12-21 ENCOUNTER — Ambulatory Visit (INDEPENDENT_AMBULATORY_CARE_PROVIDER_SITE_OTHER): Payer: BC Managed Care – PPO | Admitting: Family

## 2011-12-21 VITALS — BP 126/84 | HR 84 | Temp 98.6°F | Resp 16 | Ht 59.5 in | Wt 270.0 lb

## 2011-12-21 DIAGNOSIS — Z23 Encounter for immunization: Secondary | ICD-10-CM

## 2011-12-21 DIAGNOSIS — N760 Acute vaginitis: Secondary | ICD-10-CM

## 2011-12-21 MED ORDER — METRONIDAZOLE 500 MG PO TABS: 500.0000 mg | ORAL_TABLET | Freq: Two times a day (BID) | ORAL | Status: DC

## 2011-12-21 NOTE — Addendum Note (Signed)
Addended by: Mervin Kung A on: 12/21/2011 11:26 AM   Modules accepted: Orders

## 2011-12-21 NOTE — Assessment & Plan Note (Addendum)
Wet prep, GC/Chlamydia probe obtained today.  Plan empiric rx for trich.

## 2011-12-21 NOTE — Progress Notes (Signed)
  Subjective:    Patient ID: Diane [name], female    DOB: 10-10-1975, 36 y.o.   MRN: [id number]  HPI  Patient presents today with complaint of vaginal burning.  She reports + hx of trichomonas in October which was treated, but she does not think that her partner was treated. She has her period today but reports that her period is "light."    Review of Systems See hpi  Past Medical History  Diagnosis Date  . Elevated blood pressure     History   Social History  . Marital Status: Single    Spouse Name: N/A    Number of Children: 4  . Years of Education: N/A   Occupational History  .     Social History Main Topics  . Smoking status: Current Every Day Smoker -- 0.5 packs/day for 8 years    Types: Cigarettes  . Smokeless tobacco: Never Used  . Alcohol Use: Yes     Comment: 2-3 glasses of wine on weekend occasionally  . Drug Use: Not on file  . Sexually Active: Not on file   Other Topics Concern  . Not on file   Social History Narrative   [employer]- works from home.  3 children- (62 year old adopted child) 10, 4 and 1. 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 Cesarean section     x 3    Family History  Problem Relation Age of Onset  . Cancer Mother     throat cancer  . Hypertension Mother   . Heart disease Father 12  . Stroke Father   . Cancer Maternal Aunt     breast cancer  . Cancer Maternal Uncle     prostate  . Diabetes Maternal Grandmother   . Kidney disease Neg Hx   . Cancer Maternal Uncle     throat cancer  . Cancer Maternal Aunt     breast cancer--in remision    No Known Allergies  Current Outpatient Prescriptions on File Prior to Visit  Medication Sig Dispense Refill  . Multiple Vitamins-Minerals (MULTIVITAMIN WITH MINERALS) tablet Take 1 tablet by mouth daily.      . fluconazole (DIFLUCAN) 150 MG tablet Take 1 tablet (150 mg total) by mouth  once.  1 tablet  0    BP 126/84  Pulse 84  Temp 98.6 F (37 C) (Oral)  Resp 16  Ht 4' 11.5" (1.511 m)  Wt 270 lb (122.471 kg)  BMI 53.62 kg/m2  SpO2 99%  LMP 12/21/2011       Objective:   Physical Exam  Constitutional: She appears well-developed and well-nourished. No distress.  Cardiovascular: Normal rate and regular rhythm.   No murmur heard. Pulmonary/Chest: Effort normal and breath sounds normal. No respiratory distress. She has no wheezes. She has no rales.  Psychiatric: She has a normal mood and affect. Her behavior is normal. Judgment and thought content normal.  GYN:  Vulva normal, vagina normal- + vaginal blood noted.         Assessment & Plan:

## 2011-12-21 NOTE — Patient Instructions (Addendum)
Please start metronidazole- do not drink alcohol while on this medication. Your partner needs to be treated for trichomonas. Call if your symptoms worsen or if not resolved in 1 week.

## 2011-12-22 LAB — WET PREP BY MOLECULAR PROBE
Candida species: NEGATIVE
Gardnerella vaginalis: NEGATIVE
Trichomonas vaginosis: POSITIVE — AB

## 2012-01-13 HISTORY — PX: TUBAL LIGATION: SHX77

## 2012-01-18 ENCOUNTER — Ambulatory Visit: Payer: BC Managed Care – PPO | Admitting: Dietician

## 2012-02-08 ENCOUNTER — Ambulatory Visit: Payer: BC Managed Care – PPO | Admitting: *Deleted

## 2012-05-31 ENCOUNTER — Ambulatory Visit: Payer: BC Managed Care – PPO | Admitting: Family

## 2012-06-27 ENCOUNTER — Ambulatory Visit (INDEPENDENT_AMBULATORY_CARE_PROVIDER_SITE_OTHER): Payer: BC Managed Care – PPO | Admitting: Family

## 2012-06-27 ENCOUNTER — Encounter: Payer: Self-pay | Admitting: Family

## 2012-06-27 VITALS — BP 130/90 | HR 91 | Temp 99.0°F | Wt 267.0 lb

## 2012-06-27 DIAGNOSIS — F419 Anxiety disorder, unspecified: Secondary | ICD-10-CM | POA: Insufficient documentation

## 2012-06-27 DIAGNOSIS — N760 Acute vaginitis: Secondary | ICD-10-CM

## 2012-06-27 DIAGNOSIS — Z3009 Encounter for other general counseling and advice on contraception: Secondary | ICD-10-CM

## 2012-06-27 DIAGNOSIS — F411 Generalized anxiety disorder: Secondary | ICD-10-CM

## 2012-06-27 MED ORDER — METRONIDAZOLE 500 MG PO TABS: ORAL_TABLET | ORAL | Status: DC

## 2012-06-27 NOTE — Progress Notes (Signed)
Subjective:    Patient ID: Diane [name], female    DOB: [birth year], 37 y.o.   MRN: [id number]  HPI  Diane [name] is a 37 yr old female who presents today to discuss anxiety.  Reports that she has been been having episodes of shaking, palpitations.  Symptoms started around the beginning of April.  She moved from living with her sister to living on her own with her 4 children.  Her older sister raised her.  Reports that her sister is like a mother figure.  She is feeling overwhelmed with work and caring for her 4 children. She is also requesting a referral for a tubal ligation as she does not wish to have any additional children.  Trichomonas- last visit she reported a vaginal discharge.  Wet prep + for trichomonas. She was given rx for metronidazole.  Partner did not seek treatment.  She gave him "some of my pills."  Afraid they were not completely treated as the vaginal discharge has returned and "is the same as before."  Review of Systems See HPI  Past Medical History  Diagnosis Date  . Elevated blood pressure     History   Social History  . Marital Status: Single    Spouse Name: N/A    Number of Children: 4  . Years of Education: N/A   Occupational History  .     Social History Main Topics  . Smoking status: Current Every Day Smoker -- 0.50 packs/day for 8 years    Types: Cigarettes  . Smokeless tobacco: Never Used  . Alcohol Use: Yes     Comment: 2-3 glasses of wine on weekend occasionally  . Drug Use: Not on file  . Sexually Active: Not on file   Other Topics Concern  . Not on file   Social History Narrative   [employer]- works from home.     3 children- (59 year old adopted child) 10, 4 and 1.    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 Cesarean section      x 3    Family History  Problem Relation Age of Onset  . Cancer Mother     throat  cancer  . Hypertension Mother   . Heart disease Father 27  . Stroke Father   . Cancer Maternal Aunt     breast cancer  . Cancer Maternal Uncle     prostate  . Diabetes Maternal Grandmother   . Kidney disease Neg Hx   . Cancer Maternal Uncle     throat cancer  . Cancer Maternal Aunt     breast cancer--in remision    No Known Allergies  Current Outpatient Prescriptions on File Prior to Visit  Medication Sig Dispense Refill  . Multiple Vitamins-Minerals (MULTIVITAMIN WITH MINERALS) tablet Take 1 tablet by mouth daily.       No current facility-administered medications on file prior to visit.    BP 130/90  Pulse 91  Temp(Src) 99 F (37.2 C) (Oral)  Wt 267 lb (121.11 kg)  BMI 53.05 kg/m2  SpO2 98%       Objective:   Physical Exam  Constitutional: She appears well-developed and well-nourished. No distress.  Psychiatric: She has a normal mood and affect. Her behavior is normal. Judgment and thought content normal.          Assessment & Plan:

## 2012-06-27 NOTE — Assessment & Plan Note (Signed)
We discussed importance of regular exercise for stress management and weight loss.  Discussed using a tool such as Myfitness pal to track calories and exercise with goal weight loss of 1 pound per week.  25 minutes spent with pt today.  >50% of this time was spent counseling pt on diet, exercise, weight loss, anxiety.

## 2012-06-27 NOTE — Patient Instructions (Addendum)
Call if vaginal discharge worsens or if it does not improve after you complete your metronidazole. Your partner also needs to complete treatment. You will be contacted about your referral to GYN and the therapist. Follow up in 3 months.

## 2012-06-27 NOTE — Assessment & Plan Note (Signed)
Will retreat with flagyl.  She is instructed to ensure that partner is adequately treated as well to avoid re-infection. A referral is made to GYN for possible BTL.

## 2012-06-27 NOTE — Assessment & Plan Note (Signed)
Seems situational and stress induced given change in her living situation. This is the first time that she has lived on her own as an adult away from her sister.  Will refer for counseling.  Defer medication if symptoms worsen or if symptoms do not improve with counseling.

## 2012-07-21 ENCOUNTER — Ambulatory Visit: Payer: BC Managed Care – PPO | Admitting: Licensed Clinical Social Worker

## 2012-08-02 ENCOUNTER — Ambulatory Visit: Payer: BC Managed Care – PPO | Admitting: Licensed Clinical Social Worker

## 2012-08-11 ENCOUNTER — Other Ambulatory Visit: Payer: Self-pay | Admitting: Family

## 2012-08-11 NOTE — Telephone Encounter (Signed)
eScribe request for refill on Flagyl Last filled - 06.16.14, #4x0 Last AEX - 06.16.14 [Tx for Vaginitis & referral to GYN] Next AEX - 3 Months Please advise on refills/SLS

## 2012-08-12 NOTE — Telephone Encounter (Signed)
Notified pt and scheduled f/u for 08/17/12 at 3:15pm per pt request.

## 2012-08-12 NOTE — Telephone Encounter (Signed)
Needs office visit.  No refills.

## 2012-08-16 ENCOUNTER — Ambulatory Visit: Payer: BC Managed Care – PPO | Admitting: Licensed Clinical Social Worker

## 2012-08-17 ENCOUNTER — Ambulatory Visit (INDEPENDENT_AMBULATORY_CARE_PROVIDER_SITE_OTHER): Payer: BC Managed Care – PPO | Admitting: Family

## 2012-08-17 ENCOUNTER — Encounter: Payer: Self-pay | Admitting: Family

## 2012-08-17 VITALS — BP 140/70 | HR 85 | Temp 98.9°F | Resp 16 | Wt 270.0 lb

## 2012-08-17 DIAGNOSIS — N760 Acute vaginitis: Secondary | ICD-10-CM

## 2012-08-17 MED ORDER — METRONIDAZOLE 500 MG PO TABS: 500.0000 mg | ORAL_TABLET | Freq: Two times a day (BID) | ORAL | Status: DC

## 2012-08-17 NOTE — Progress Notes (Signed)
  Subjective:    Patient ID: Diane [name], female    DOB: 02/20/1975, 37 y.o.   MRN: [id number]  HPI  Diane [name] is a 37 yr old female who presents today with chief complaint of vaginal discharge. Discharge has been present for 1 week and reports that the symptoms are "the same" as before when we treated her for trichomonas.  Reports that she felt better briefly after treatment for trichomonas. She reports that her partner told her that he received treatment but she is not sure if she believes him.  She plans [facility] to ensure treatment as he does not have health insurance.   Report mild associated dysuria.  Has menses today.  Review of Systems See HPI  Past Medical History  Diagnosis Date  . Elevated blood pressure     History   Social History  . Marital Status: Single    Spouse Name: N/A    Number of Children: 4  . Years of Education: N/A   Occupational History  .     Social History Main Topics  . Smoking status: Current Every Day Smoker -- 0.50 packs/day for 8 years    Types: Cigarettes  . Smokeless tobacco: Never Used  . Alcohol Use: Yes     Comment: 2-3 glasses of wine on weekend occasionally  . Drug Use: Not on file  . Sexually Active: Not on file   Other Topics Concern  . Not on file   Social History Narrative   [employer]- works from home.     3 children- (65 year old adopted child) 10, 4 and 1.    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 Cesarean section      x 3    Family History  Problem Relation Age of Onset  . Cancer Mother     throat cancer  . Hypertension Mother   . Heart disease Father 4  . Stroke Father   . Cancer Maternal Aunt     breast cancer  . Cancer Maternal Uncle     prostate  . Diabetes Maternal Grandmother   . Kidney disease Neg Hx   . Cancer Maternal Uncle     throat cancer  .  Cancer Maternal Aunt     breast cancer--in remision    No Known Allergies  Current Outpatient Prescriptions on File Prior to Visit  Medication Sig Dispense Refill  . Multiple Vitamins-Minerals (MULTIVITAMIN WITH MINERALS) tablet Take 1 tablet by mouth daily.       No current facility-administered medications on file prior to visit.    BP 140/70  Pulse 85  Temp(Src) 98.9 F (37.2 C) (Oral)  Resp 16  Wt 270 lb 0.6 oz (122.489 kg)  BMI 53.65 kg/m2  SpO2 99%  LMP 08/14/2012       Objective:   Physical Exam  Constitutional: She is oriented to person, place, and time. She appears well-developed and well-nourished.  HENT:  Head: Normocephalic and atraumatic.  Cardiovascular: Normal rate and regular rhythm.   No murmur heard. Neurological: She is alert and oriented to person, place, and time.  Psychiatric: She has a normal mood and affect. Her behavior is normal. Judgment and thought content normal.          Assessment & Plan:

## 2012-08-17 NOTE — Addendum Note (Signed)
Addended by: Mervin Kung A on: 08/17/2012 04:17 PM   Modules accepted: Orders

## 2012-08-17 NOTE — Patient Instructions (Addendum)
Please start metronidazole. Call if symptoms worsen or if not resolved in 1 week.

## 2012-08-17 NOTE — Assessment & Plan Note (Signed)
Suspect recurrent trichomonas.  Will rx with metronidazole.  Pt is instructed to have partner retreated.  Wet prep deferred due to menses. Will send urine for culture and GC/Chlamydia.

## 2012-08-18 LAB — GC/CHLAMYDIA PROBE AMP, URINE: GC Probe Amp, Urine: NEGATIVE

## 2012-09-28 ENCOUNTER — Ambulatory Visit: Payer: BC Managed Care – PPO | Admitting: Family

## 2012-10-04 ENCOUNTER — Ambulatory Visit: Payer: BC Managed Care – PPO | Admitting: Family

## 2013-12-20 ENCOUNTER — Other Ambulatory Visit: Payer: Self-pay | Admitting: Obstetrics and Gynecology

## 2013-12-21 LAB — CYTOLOGY - PAP

## 2014-01-02 ENCOUNTER — Inpatient Hospital Stay (HOSPITAL_COMMUNITY)
Admission: EM | Admit: 2014-01-02 | Discharge: 2014-01-08 | DRG: 872 | Disposition: A | Discharge status: Home / Self Care | Payer: BC Managed Care – PPO | Attending: Internal Medicine | Admitting: Internal Medicine

## 2014-01-02 ENCOUNTER — Encounter (HOSPITAL_COMMUNITY): Payer: Self-pay

## 2014-01-02 DIAGNOSIS — M79605 Pain in left leg: Secondary | ICD-10-CM | POA: Diagnosis not present

## 2014-01-02 DIAGNOSIS — L03116 Cellulitis of left lower limb: Secondary | ICD-10-CM | POA: Diagnosis present

## 2014-01-02 DIAGNOSIS — R7302 Impaired glucose tolerance (oral): Secondary | ICD-10-CM | POA: Diagnosis present

## 2014-01-02 DIAGNOSIS — K5900 Constipation, unspecified: Secondary | ICD-10-CM | POA: Diagnosis present

## 2014-01-02 DIAGNOSIS — A419 Sepsis, unspecified organism: Secondary | ICD-10-CM | POA: Diagnosis not present

## 2014-01-02 DIAGNOSIS — I10 Essential (primary) hypertension: Secondary | ICD-10-CM | POA: Diagnosis present

## 2014-01-02 DIAGNOSIS — F17210 Nicotine dependence, cigarettes, uncomplicated: Secondary | ICD-10-CM | POA: Diagnosis present

## 2014-01-02 DIAGNOSIS — N179 Acute kidney failure, unspecified: Secondary | ICD-10-CM | POA: Insufficient documentation

## 2014-01-02 DIAGNOSIS — Z6841 Body Mass Index (BMI) 40.0 and over, adult: Secondary | ICD-10-CM

## 2014-01-02 DIAGNOSIS — L0390 Cellulitis, unspecified: Secondary | ICD-10-CM

## 2014-01-02 DIAGNOSIS — R739 Hyperglycemia, unspecified: Secondary | ICD-10-CM | POA: Diagnosis present

## 2014-01-02 LAB — CBC WITH DIFFERENTIAL/PLATELET
Basophils Absolute: 0 10*3/uL (ref 0.0–0.1)
Basophils Relative: 0 % (ref 0–1)
EOS PCT: 0 % (ref 0–5)
Eosinophils Absolute: 0 10*3/uL (ref 0.0–0.7)
HCT: 38.3 % (ref 36.0–46.0)
HEMOGLOBIN: 12.3 g/dL (ref 12.0–15.0)
LYMPHS PCT: 6 % — AB (ref 12–46)
Lymphs Abs: 1.2 10*3/uL (ref 0.7–4.0)
MCH: 26.5 pg (ref 26.0–34.0)
MCHC: 32.1 g/dL (ref 30.0–36.0)
MCV: 82.5 fL (ref 78.0–100.0)
MONOS PCT: 3 % (ref 3–12)
Monocytes Absolute: 0.5 10*3/uL (ref 0.1–1.0)
NEUTROS ABS: 18.4 10*3/uL — AB (ref 1.7–7.7)
Neutrophils Relative %: 91 % — ABNORMAL HIGH (ref 43–77)
Platelets: 305 10*3/uL (ref 150–400)
RBC: 4.64 MIL/uL (ref 3.87–5.11)
RDW: 16.1 % — ABNORMAL HIGH (ref 11.5–15.5)
WBC: 20 10*3/uL — ABNORMAL HIGH (ref 4.0–10.5)

## 2014-01-02 LAB — COMPREHENSIVE METABOLIC PANEL
ALT: 17 U/L (ref 0–35)
ANION GAP: 9 (ref 5–15)
AST: 30 U/L (ref 0–37)
Albumin: 3.3 g/dL — ABNORMAL LOW (ref 3.5–5.2)
Alkaline Phosphatase: 65 U/L (ref 39–117)
BILIRUBIN TOTAL: 0.8 mg/dL (ref 0.3–1.2)
BUN: 13 mg/dL (ref 6–23)
CALCIUM: 8.9 mg/dL (ref 8.4–10.5)
CHLORIDE: 98 meq/L (ref 96–112)
CO2: 28 mmol/L (ref 19–32)
CREATININE: 0.87 mg/dL (ref 0.50–1.10)
GFR calc Af Amer: >90 mL/min (ref >90)
GFR, EST NON AFRICAN AMERICAN: 83 mL/min — AB (ref >90)
Glucose, Bld: 131 mg/dL — ABNORMAL HIGH (ref 70–99)
Potassium: 3.7 mmol/L (ref 3.5–5.1)
Sodium: 135 mmol/L (ref 135–145)
Total Protein: 8.3 g/dL (ref 6.0–8.3)

## 2014-01-02 MED ORDER — HYDROMORPHONE HCL 1 MG/ML IJ SOLN
1.0000 mg | Freq: Once | INTRAMUSCULAR | Status: AC
  Administered 2014-01-02: 1 mg via INTRAVENOUS
  Filled 2014-01-02: qty 1

## 2014-01-02 MED ORDER — ONDANSETRON HCL 4 MG/2ML IJ SOLN
4.0000 mg | Freq: Once | INTRAMUSCULAR | Status: AC
  Administered 2014-01-02: 4 mg via INTRAVENOUS
  Filled 2014-01-02: qty 2

## 2014-01-02 MED ORDER — HYDROMORPHONE HCL 1 MG/ML IJ SOLN
0.5000 mg | INTRAMUSCULAR | Status: DC | PRN
  Administered 2014-01-02 – 2014-01-08 (×28): 1 mg via INTRAVENOUS
  Filled 2014-01-02 (×28): qty 1

## 2014-01-02 MED ORDER — ACETAMINOPHEN 650 MG RE SUPP: 650.0000 mg | Freq: Four times a day (QID) | RECTAL | Status: DC | PRN

## 2014-01-02 MED ORDER — SODIUM CHLORIDE 0.9 % IV BOLUS (SEPSIS)
1000.0000 mL | Freq: Once | INTRAVENOUS | Status: AC
  Administered 2014-01-02: 1000 mL via INTRAVENOUS

## 2014-01-02 MED ORDER — VANCOMYCIN HCL 10 G IV SOLR
1250.0000 mg | Freq: Three times a day (TID) | INTRAVENOUS | Status: DC
  Administered 2014-01-02 – 2014-01-03 (×3): 1250 mg via INTRAVENOUS
  Filled 2014-01-02 (×3): qty 1250

## 2014-01-02 MED ORDER — VANCOMYCIN HCL IN DEXTROSE 1-5 GM/200ML-% IV SOLN
1000.0000 mg | Freq: Once | INTRAVENOUS | Status: AC
  Administered 2014-01-02: 1000 mg via INTRAVENOUS
  Filled 2014-01-02: qty 200

## 2014-01-02 MED ORDER — SODIUM CHLORIDE 0.9 % IV SOLN
INTRAVENOUS | Status: DC
  Administered 2014-01-02 – 2014-01-07 (×12): via INTRAVENOUS

## 2014-01-02 MED ORDER — HEPARIN SODIUM (PORCINE) 5000 UNIT/ML IJ SOLN
5000.0000 [IU] | Freq: Three times a day (TID) | INTRAMUSCULAR | Status: DC
  Administered 2014-01-02 – 2014-01-08 (×19): 5000 [IU] via SUBCUTANEOUS
  Filled 2014-01-02 (×20): qty 1

## 2014-01-02 MED ORDER — VANCOMYCIN HCL 10 G IV SOLR
1500.0000 mg | Freq: Once | INTRAVENOUS | Status: AC
  Administered 2014-01-02: 1500 mg via INTRAVENOUS
  Filled 2014-01-02: qty 1500

## 2014-01-02 MED ORDER — ACETAMINOPHEN 325 MG PO TABS
650.0000 mg | ORAL_TABLET | Freq: Four times a day (QID) | ORAL | Status: DC | PRN
  Administered 2014-01-02 – 2014-01-07 (×3): 650 mg via ORAL
  Filled 2014-01-02 (×3): qty 2

## 2014-01-02 NOTE — Progress Notes (Signed)
*  PRELIMINARY RESULTS* Vascular Ultrasound Left lower extremity venous duplex has been completed.  Preliminary findings: no evidence of DVT in visualized veins.  Landry Mellow, RDMS, RVT  01/02/2014, 8:49 AM

## 2014-01-02 NOTE — H&P (Signed)
[facility] History and Physical  MORNING HALBERG SJG:[id number] DOB: 1975-08-27 DOA: 01/02/2014  Referring physician: EDP PCP: Nance Pear., NP   Chief Complaint: Left leg pain   HPI: Diane [name] is a 38 y.o. female who presents to the ED with c/o LLE pain, redness, swelling.  Symptoms onset 4 days ago, worsening since then, no injury, no SOB, no CP, no recent travel, no h/o DVT, no family h/o clotting disorders.  She denies fever subjectively (objectively T in ED is 99.1).  W/u in ED is highly suspicious for cellulitis with sepsis: WBC 20k, tachycardic to the 120s.  Review of Systems: Systems reviewed.  As above, otherwise negative  Past Medical History  Diagnosis Date  . Elevated blood pressure    Past Surgical History  Procedure Laterality Date  . Cesarean section      x 3   Social History:  reports that she has been smoking Cigarettes.  She has a 4 pack-year smoking history. She has never used smokeless tobacco. She reports that she drinks alcohol. Her drug history is not on file.  No Known Allergies  Family History  Problem Relation Age of Onset  . Cancer Mother     throat cancer  . Hypertension Mother   . Heart disease Father 55  . Stroke Father   . Cancer Maternal Aunt     breast cancer  . Cancer Maternal Uncle     prostate  . Diabetes Maternal Grandmother   . Kidney disease Neg Hx   . Cancer Maternal Uncle     throat cancer  . Cancer Maternal Aunt     breast cancer--in remision     Prior to Admission medications   Not on File   Physical Exam: Filed Vitals:   01/02/14 0429  BP: 145/71  Pulse: 113  Temp:   Resp: 20    BP 145/71 mmHg  Pulse 113  Temp(Src) 99.1 F (37.3 C) (Oral)  Resp 20  SpO2 97%  General Appearance:    Alert, oriented, no distress, appears stated age  Head:    Normocephalic, atraumatic  Eyes:    PERRL, EOMI, sclera non-icteric        Nose:   Nares without drainage or epistaxis. Mucosa,  turbinates normal  Throat:   Moist mucous membranes. Oropharynx without erythema or exudate.  Neck:   Supple. No carotid bruits.  No thyromegaly.  No lymphadenopathy.   Back:     No CVA tenderness, no spinal tenderness  Lungs:     Clear to auscultation bilaterally, without wheezes, rhonchi or rales  Chest wall:    No tenderness to palpitation  Heart:    Regular rate and rhythm without murmurs, gallops, rubs  Abdomen:     Soft, non-tender, nondistended, normal bowel sounds, no organomegaly  Genitalia:    deferred  Rectal:    deferred  Extremities:   No clubbing, cyanosis or edema.  Pulses:   2+ and symmetric all extremities  Skin:   LLE erythema, edema, warmth, TTP.  Area of cellulitis goes from ankle to knee, is circumferential, spares foot, pain is localized to area of erythema.  No subcutaneous emphysema.  Lymph nodes:   Cervical, supraclavicular, and axillary nodes normal  Neurologic:   CNII-XII intact. Normal strength, sensation and reflexes      throughout    Labs on Admission:  Basic Metabolic Panel:  Recent Labs Lab 01/02/14 0240  NA 135  K 3.7  CL 98  CO2 28  GLUCOSE 131*  BUN 13  CREATININE 0.87  CALCIUM 8.9   Liver Function Tests:  Recent Labs Lab 01/02/14 0240  AST 30  ALT 17  ALKPHOS 65  BILITOT 0.8  PROT 8.3  ALBUMIN 3.3*   No results for input(s): LIPASE, AMYLASE in the last 168 hours. No results for input(s): AMMONIA in the last 168 hours. CBC:  Recent Labs Lab 01/02/14 0240  WBC 20.0*  NEUTROABS 18.4*  HGB 12.3  HCT 38.3  MCV 82.5  PLT 305   Cardiac Enzymes: No results for input(s): CKTOTAL, CKMB, CKMBINDEX, TROPONINI in the last 168 hours.  BNP (last 3 results) No results for input(s): PROBNP in the last 8760 hours. CBG: No results for input(s): GLUCAP in the last 168 hours.  Radiological Exams on Admission: No results found.  EKG: Independently reviewed.  Assessment/Plan Principal Problem:   Sepsis due to cellulitis Active  Problems:   Cellulitis of left lower extremity without foot   1. Sepsis due to cellulitis of LLE - ankle cellulitis spares foot goes from ankle to knee. 1. NS at 125 cc/hr 2. Vancomycin per pharm consult 3. Blood cultures pending 4. Will obtain venous duplex to r/o DVT 5. Trend WBC with daily CBCs. 6. Tele monitor for tachycardia, hope to improve with IVF. 7. Dilaudid for pain 8. Tylenol for fever.   Code Status: Full Code  Family Communication: No family in room Disposition Plan: admit to inpatient   Time spent: 70 min  GARDNER, JARED M. [facility] Pager [phone]  If 7AM-7PM, please contact the day team taking care of the patient [email or web address] Password [email or web address] 01/02/2014, 4:52 AM

## 2014-01-02 NOTE — Progress Notes (Signed)
ANTIBIOTIC CONSULT NOTE - INITIAL  Pharmacy Consult for Vancomycin Indication: Sepsis secondary to LLE cellulitis  No Known Allergies  Patient Measurements:   Wt=  Vital Signs: Temp: 99.1 F (37.3 C) (12/22 0208) Temp Source: Oral (12/22 0208) BP: 145/71 mmHg (12/22 0429) Pulse Rate: 113 (12/22 0429) Intake/Output from previous day:   Intake/Output from this shift:    Labs:  Recent Labs  01/02/14 0240  WBC 20.0*  HGB 12.3  PLT 305  CREATININE 0.87   CrCl cannot be calculated (Unknown ideal weight.). No results for input(s): VANCOTROUGH, VANCOPEAK, VANCORANDOM, GENTTROUGH, GENTPEAK, GENTRANDOM, TOBRATROUGH, TOBRAPEAK, TOBRARND, AMIKACINPEAK, AMIKACINTROU, AMIKACIN in the last 72 hours.   Microbiology: No results found for this or any previous visit (from the past 720 hour(s)).  Medical History: Past Medical History  Diagnosis Date  . Elevated blood pressure     Medications:  Scheduled:  . heparin  5,000 Units Subcutaneous 3 times per day   Infusions:  . sodium chloride     Assessment: 70 yoF c/o LLE pain, redness and swelling.  Vancomycin per Rx for Sepsis secondary to cellulitis.   Goal of Therapy:  Vancomycin trough level 15-20 mcg/ml  Plan:   Vancomycin 1Gm x1 in ED ~0400  Give additional 1500mg  x1 = 2500mg  load then 1250mg  IV q8h   F/u Scr/levels/cultures as needed   Lawana Pai R 01/02/2014,5:04 AM

## 2014-01-02 NOTE — ED Provider Notes (Signed)
CSN: [id number]     Arrival date & time 01/02/14  0203 History   First MD Initiated Contact with Patient 01/02/14 [phone]     Chief Complaint  Patient presents with  . Leg Pain     (Consider location/radiation/quality/duration/timing/severity/associated sxs/prior Treatment) Patient is a 38 y.o. female presenting with leg pain. The history is provided by the patient. No language interpreter was used.  Leg Pain Location:  Leg Time since incident:  4 days Injury: no   Leg location:  L lower leg Pain details:    Severity:  Moderate   Onset quality:  Gradual Chronicity:  New Associated symptoms: no fever   Associated symptoms comment:  Red, warm, swollen and painful left lower extremity for the past 4 days. No injury. No SOB, CP. No history of DVT. She denies fever.    Past Medical History  Diagnosis Date  . Elevated blood pressure    Past Surgical History  Procedure Laterality Date  . Cesarean section      x 3   Family History  Problem Relation Age of Onset  . Cancer Mother     throat cancer  . Hypertension Mother   . Heart disease Father 58  . Stroke Father   . Cancer Maternal Aunt     breast cancer  . Cancer Maternal Uncle     prostate  . Diabetes Maternal Grandmother   . Kidney disease Neg Hx   . Cancer Maternal Uncle     throat cancer  . Cancer Maternal Aunt     breast cancer--in remision   History  Substance Use Topics  . Smoking status: Current Every Day Smoker -- 0.50 packs/day for 8 years    Types: Cigarettes  . Smokeless tobacco: Never Used  . Alcohol Use: Yes     Comment: 2-3 glasses of wine on weekend occasionally   OB History    No data available     Review of Systems  Constitutional: Negative for fever.  Respiratory: Negative.   Cardiovascular: Negative.   Gastrointestinal: Negative.   Musculoskeletal:       See HPI.  Skin: Positive for color change.  Neurological: Negative.  Negative for numbness.      Allergies  Review of patient's  allergies indicates no known allergies.  Home Medications   Prior to Admission medications   Medication Sig Start Date End Date Taking? Authorizing Provider  metroNIDAZOLE (FLAGYL) 500 MG tablet Take 1 tablet (500 mg total) by mouth 2 (two) times daily. 4 tablet by mouth once Patient not taking: Reported on 01/02/2014 08/17/12   Debbrah Alar, NP   BP 153/81 mmHg  Pulse 122  Temp(Src) 99.1 F (37.3 C) (Oral)  Resp 20  SpO2 97% Physical Exam  Constitutional: She is oriented to person, place, and time. She appears well-developed and well-nourished.  HENT:  Head: Normocephalic.  Neck: Normal range of motion. Neck supple.  Cardiovascular: Regular rhythm.  Tachycardia present.   Pulmonary/Chest: Effort normal and breath sounds normal.  Abdominal: Soft. Bowel sounds are normal. There is no tenderness. There is no rebound and no guarding.  Musculoskeletal: Normal range of motion.  Left lower leg moderately swollen circumferentially. It is significantly erythematous and warm to touch. Redness extends from ankle to proximal lower leg and spares foot.   Neurological: She is alert and oriented to person, place, and time.  Skin: Skin is warm and dry. No rash noted.  Psychiatric: She has a normal mood and affect.    ED  Course  Procedures (including critical care time) Labs Review Labs Reviewed  CBC WITH DIFFERENTIAL  COMPREHENSIVE METABOLIC PANEL    Imaging Review No results found.   EKG Interpretation None      MDM   Final diagnoses:  None    1. LE cellulitis  DDx on initial evaluation was DVT vs cellulitis, favor cellulitis. She has a low grade temperature, is significantly tachycardic and with marked leukocytosis on lab studies, feel admission for IV antibiotic treatment of cellulitis is appropriate. IV Vancomycin started after blood cultures obtained. [facility] for admission.    Dewaine Oats, PA-C 01/02/14 [place], MD 01/02/14 [phone]

## 2014-01-02 NOTE — ED Notes (Signed)
Bed: SR15 Expected date: 01/02/14 Expected time: 1:53 AM Means of arrival: Ambulance Comments: Bilateral leg pain

## 2014-01-02 NOTE — Progress Notes (Signed)
CARE MANAGEMENT NOTE 01/02/2014  Patient:  Diane [name], Diane [name]   Account Number:  [id number]  Date Initiated:  01/02/2014  Documentation initiated by:  Dessa Phi  Subjective/Objective Assessment:   38 y/o f admitted w/lle cellulitis.     Action/Plan:   From home.   Anticipated DC Date:  01/05/2014   Anticipated DC Plan:  [place]  CM consult      Choice offered to / List presented to:             Status of service:  In process, will continue to follow Medicare Important Message given?   (If response is "NO", the following Medicare IM given date fields will be blank) Date Medicare IM given:   Medicare IM given by:   Date Additional Medicare IM given:   Additional Medicare IM given by:    Discharge Disposition:    Per UR Regulation:  Reviewed for med. necessity/level of care/duration of stay  If discussed at [place] of Stay Meetings, dates discussed:    Comments:  01/02/14 Dessa Phi RN, BSN NCM [phone] No anticipated d/c needs.

## 2014-01-02 NOTE — Progress Notes (Signed)
[facility]                                                                              Patient Demographics  Diane [name], is a 38 y.o. female, DOB - 11-14-1975, VOJ:[id number]  Admit date - 01/02/2014   Admitting Physician Etta Quill, DO  Outpatient Primary MD for the patient is Nance Pear., NP  LOS - 0   Chief Complaint  Patient presents with  . Leg Pain      HPI on 01/02/2014 by Dr. Jennette Kettle Diane [name] is a 38 y.o. female who presents to the ED with c/o LLE pain, redness, swelling. Symptoms onset 4 days ago, worsening since then, no injury, no SOB, no CP, no recent travel, no h/o DVT, no family h/o clotting disorders. She denies fever subjectively (objectively T in ED is 99.1). W/u in ED is highly suspicious for cellulitis with sepsis: WBC 20k, tachycardic to the 120s.  Assessment & Plan   Sepsis secondary to LLE cellulitis -Patient tachycardic, with leukocytosis upon admission -Patient does not recall any type of trauma or injury -Blood cultures pending -Continue Tylenol for fever, Dilaudid for pain -Continue vancomycin per pharmacy -Will continue to monitor CBC -Left lower extremity Doppler negative for DVT  Morbid obesity, BMP 56.4 -Patient will need to discuss lifestyle modifications and weight loss including diet and exercise with her primary care physician when discharged -Will consult nutrition  Code Status: Full  Family Communication: None at bedside  Disposition Plan: Admitted  Time Spent in minutes   30 minutes  Procedures  LLE doppler: negative for DVT  Consults   None  DVT Prophylaxis  Heparin  Lab Results  Component Value Date   PLT 305 01/02/2014    Medications  Scheduled Meds: . heparin  5,000 Units Subcutaneous 3 times per day  . vancomycin  1,250 mg Intravenous Q8H   Continuous Infusions: . sodium chloride 125 mL/hr at 01/02/14 0603   PRN Meds:.acetaminophen **OR** acetaminophen,  HYDROmorphone (DILAUDID) injection  Antibiotics    Anti-infectives    Start     Dose/Rate Route Frequency Ordered Stop   01/02/14 1600  vancomycin (VANCOCIN) 1,250 mg in sodium chloride 0.9 % 250 mL IVPB     1,250 mg166.7 mL/hr over 90 Minutes Intravenous Every 8 hours 01/02/14 0652     01/02/14 0645  vancomycin (VANCOCIN) 1,500 mg in sodium chloride 0.9 % 500 mL IVPB     1,500 mg250 mL/hr over 120 Minutes Intravenous  Once 01/02/14 0628 01/02/14 0857   01/02/14 0330  vancomycin (VANCOCIN) IVPB 1000 mg/200 mL premix    Comments:  Please obtain blood cultures prior to starting abx   1,000 mg200 mL/hr over 60 Minutes Intravenous  Once 01/02/14 0326 01/02/14 0500        Subjective:   [employer] seen and examined today.  Patient continues to complain of pain in her left leg.  Denies any trauma, insect bites, changes in lotions or detergents.  Denies chest pain, shortness of breath, abdominal pain.  Objective:   Filed Vitals:   01/02/14 0208 01/02/14 0429 01/02/14 0524  BP: 153/81 145/71 145/76  Pulse: 122 113 112  Temp: 99.1  F (37.3 C)  99.8 F (37.7 C)  TempSrc: Oral  Oral  Resp: 20 20 20   Height:   4\' 11"  (1.499 m)  Weight:   126.5 kg (278 lb 14.1 oz)  SpO2: 97% 97% 98%    Wt Readings from Last 3 Encounters:  01/02/14 126.5 kg (278 lb 14.1 oz)  08/17/12 122.489 kg (270 lb 0.6 oz)  06/27/12 121.11 kg (267 lb)     Intake/Output Summary (Last 24 hours) at 01/02/14 1304 Last data filed at 01/02/14 0900  Gross per 24 hour  Intake 1192.5 ml  Output      0 ml  Net 1192.5 ml    Exam  General: Well developed, well nourished, NAD, appears stated age  56: NCAT,mucous membranes moist.   Cardiovascular: S1 S2 auscultated, no rubs, murmurs or gallops. Regular rate and rhythm.  Respiratory: Clear to auscultation bilaterally with equal chest rise  Abdomen: Soft, obese, nontender, nondistended, + bowel sounds  Extremities: warm dry without cyanosis clubbing. 2+  edema in LLE with erythema extending from the ankle to knee, warm and tender to touch.  Neuro: AAOx3, no focal deficits  Psych: Normal affect and demeanor with intact judgement and insight  Data Review   Micro Results No results found for this or any previous visit (from the past 240 hour(s)).  Radiology Reports No results found.  CBC  Recent Labs Lab 01/02/14 0240  WBC 20.0*  HGB 12.3  HCT 38.3  PLT 305  MCV 82.5  MCH 26.5  MCHC 32.1  RDW 16.1*  LYMPHSABS 1.2  MONOABS 0.5  EOSABS 0.0  BASOSABS 0.0    Chemistries   Recent Labs Lab 01/02/14 0240  NA 135  K 3.7  CL 98  CO2 28  GLUCOSE 131*  BUN 13  CREATININE 0.87  CALCIUM 8.9  AST 30  ALT 17  ALKPHOS 65  BILITOT 0.8   ------------------------------------------------------------------------------------------------------------------ estimated creatinine clearance is 104.8 mL/min (by C-G formula based on Cr of 0.87). ------------------------------------------------------------------------------------------------------------------ No results for input(s): HGBA1C in the last 72 hours. ------------------------------------------------------------------------------------------------------------------ No results for input(s): CHOL, HDL, LDLCALC, TRIG, CHOLHDL, LDLDIRECT in the last 72 hours. ------------------------------------------------------------------------------------------------------------------ No results for input(s): TSH, T4TOTAL, T3FREE, THYROIDAB in the last 72 hours.  Invalid input(s): FREET3 ------------------------------------------------------------------------------------------------------------------ No results for input(s): VITAMINB12, FOLATE, FERRITIN, TIBC, IRON, RETICCTPCT in the last 72 hours.  Coagulation profile No results for input(s): INR, PROTIME in the last 168 hours.  No results for input(s): DDIMER in the last 72 hours.  Cardiac Enzymes No results for input(s): CKMB,  TROPONINI, MYOGLOBIN in the last 168 hours.  Invalid input(s): CK ------------------------------------------------------------------------------------------------------------------ Invalid input(s): POCBNP    Olyver Hawes D.O. on 01/02/2014 at 1:04 PM  Between 7am to 7pm - Pager - [phone]  After 7pm go to [email or web address] - password [email or web address]  And look for the night coverage person covering for me after hours  [facility] Office  [phone]

## 2014-01-02 NOTE — ED Notes (Signed)
Pt complains of left lower leg pain since Saturday, per EMS leg is swollen and warm to touch.

## 2014-01-03 DIAGNOSIS — R748 Abnormal levels of other serum enzymes: Secondary | ICD-10-CM

## 2014-01-03 LAB — BASIC METABOLIC PANEL
Anion gap: 8 (ref 5–15)
BUN: 20 mg/dL (ref 6–23)
CALCIUM: 7.7 mg/dL — AB (ref 8.4–10.5)
CHLORIDE: 100 meq/L (ref 96–112)
CO2: 26 mmol/L (ref 19–32)
CREATININE: 2.18 mg/dL — AB (ref 0.50–1.10)
GFR calc non Af Amer: 27 mL/min — ABNORMAL LOW (ref >90)
GFR, EST AFRICAN AMERICAN: 32 mL/min — AB (ref >90)
Glucose, Bld: 107 mg/dL — ABNORMAL HIGH (ref 70–99)
Potassium: 3.6 mmol/L (ref 3.5–5.1)
Sodium: 134 mmol/L — ABNORMAL LOW (ref 135–145)

## 2014-01-03 LAB — CBC
HCT: 35.7 % — ABNORMAL LOW (ref 36.0–46.0)
Hemoglobin: 11 g/dL — ABNORMAL LOW (ref 12.0–15.0)
MCH: 25.6 pg — ABNORMAL LOW (ref 26.0–34.0)
MCHC: 30.8 g/dL (ref 30.0–36.0)
MCV: 83.2 fL (ref 78.0–100.0)
Platelets: 324 10*3/uL (ref 150–400)
RBC: 4.29 MIL/uL (ref 3.87–5.11)
RDW: 16.7 % — ABNORMAL HIGH (ref 11.5–15.5)
WBC: 14.6 10*3/uL — ABNORMAL HIGH (ref 4.0–10.5)

## 2014-01-03 LAB — CREATININE, SERUM
Creatinine, Ser: 2.42 mg/dL — ABNORMAL HIGH (ref 0.50–1.10)
GFR calc Af Amer: 28 mL/min — ABNORMAL LOW (ref >90)
GFR, EST NON AFRICAN AMERICAN: 24 mL/min — AB (ref >90)

## 2014-01-03 LAB — VANCOMYCIN, TROUGH: Vancomycin Tr: 49.1 ug/mL (ref 10.0–20.0)

## 2014-01-03 MED ORDER — CLINDAMYCIN PHOSPHATE 600 MG/50ML IV SOLN
600.0000 mg | Freq: Three times a day (TID) | INTRAVENOUS | Status: DC
  Administered 2014-01-03 – 2014-01-08 (×15): 600 mg via INTRAVENOUS
  Filled 2014-01-03 (×16): qty 50

## 2014-01-03 MED ORDER — POLYETHYLENE GLYCOL 3350 17 G PO PACK
17.0000 g | PACK | Freq: Every day | ORAL | Status: DC
  Administered 2014-01-03 – 2014-01-07 (×5): 17 g via ORAL
  Filled 2014-01-03 (×6): qty 1

## 2014-01-03 NOTE — Progress Notes (Signed)
ANTIBIOTIC CONSULT NOTE - Follow Up  Pharmacy Consult for Vancomycin Indication: Sepsis secondary to LLE cellulitis  No Known Allergies  Patient Measurements: Height: 4\' 11"  (149.9 cm) Weight: 278 lb 14.1 oz (126.5 kg) IBW/kg (Calculated) : 43.2 Wt=  Vital Signs: Temp: 100 F (37.8 C) (12/23 0504) Temp Source: Oral (12/23 0504) BP: 135/85 mmHg (12/23 0504) Pulse Rate: 108 (12/23 0504) Intake/Output from previous day: 12/22 0701 - 12/23 0700 In: 2350 [P.O.:600; I.V.:1500; IV Piggyback:250] Out: -  Intake/Output from this shift:    Labs:  Recent Labs  01/02/14 0240 01/03/14 0455  WBC 20.0* 14.6*  HGB 12.3 11.0*  PLT 305 324  CREATININE 0.87 2.18*   Estimated Creatinine Clearance: 41.8 mL/min (by C-G formula based on Cr of 2.18). No results for input(s): VANCOTROUGH, VANCOPEAK, VANCORANDOM, GENTTROUGH, GENTPEAK, GENTRANDOM, TOBRATROUGH, TOBRAPEAK, TOBRARND, AMIKACINPEAK, AMIKACINTROU, AMIKACIN in the last 72 hours.   Microbiology: Recent Results (from the past 720 hour(s))  Blood culture (routine x 2)     Status: None (Preliminary result)   Collection Time: 01/02/14  3:35 AM  Result Value Ref Range Status   Specimen Description BLOOD LEFT HAND  Final   Special Requests BOTTLES DRAWN AEROBIC AND ANAEROBIC 5CC  Final   Culture  Setup Time   Final    01/02/2014 13:28 Performed at [facility]    Culture   Final           BLOOD CULTURE RECEIVED NO GROWTH TO DATE CULTURE WILL BE HELD FOR 5 DAYS BEFORE ISSUING A FINAL NEGATIVE REPORT Performed at [facility]    Report Status PENDING  Incomplete  Blood culture (routine x 2)     Status: None (Preliminary result)   Collection Time: 01/02/14  3:46 AM  Result Value Ref Range Status   Specimen Description BLOOD RIGHT ARM  Final   Special Requests BOTTLES DRAWN AEROBIC AND ANAEROBIC 5CC  Final   Culture  Setup Time   Final    01/02/2014 13:28 Performed at [facility]    Culture   Final      BLOOD CULTURE RECEIVED NO GROWTH TO DATE CULTURE WILL BE HELD FOR 5 DAYS BEFORE ISSUING A FINAL NEGATIVE REPORT Performed at [facility]    Report Status PENDING  Incomplete    Medical History: Past Medical History  Diagnosis Date  . Elevated blood pressure     Medications:  Scheduled:  . heparin  5,000 Units Subcutaneous 3 times per day  . polyethylene glycol  17 g Oral Daily   Infusions:  . sodium chloride 125 mL/hr at 01/03/14 0504   Assessment: 71 yoF c/o LLE pain, redness and swelling.  Vancomycin per Rx for Sepsis secondary to cellulitis on 12/22  12/22 >> Vancomycin >>  Tmax 100.4 WBC improved SCr 2.18 - sharp rise compared to yesterday, CrCl N now only 39 ml/min/1.62m2  Today, 12/23: Day 2 Vancomycin 1250mg  IV q8 (received 3 doses) after 2500mg  load per protocol - Will hold vancomycin pending vanc trough due to sharp rise in SCr  Goal of Therapy:  Vancomycin trough level 15-20 mcg/ml  Plan:  1) D/C current vancomycin order due to dramatic increase in SCr 2) Check vancomycin trough at 3pm 3) Will order vancomycin again pending level result   Adrian Saran, PharmD, BCPS Pager [phone] 01/03/2014 8:32 AM

## 2014-01-03 NOTE — Progress Notes (Signed)
[facility]                                                                              Patient Demographics  Diane [name], is a 38 y.o. female, DOB - June 18, 1975, [id number]  Admit date - 01/02/2014   Admitting Physician Etta Quill, DO  Outpatient Primary MD for the patient is Nance Pear., NP  LOS - 1   Chief Complaint  Patient presents with  . Leg Pain      HPI on 01/02/2014 by Dr. Jennette Kettle Diane [name] is a 39 y.o. female who presents to the ED with c/o LLE pain, redness, swelling. Symptoms onset 4 days ago, worsening since then, no injury, no SOB, no CP, no recent travel, no h/o DVT, no family h/o clotting disorders. She denies fever subjectively (objectively T in ED is 99.1). W/u in ED is highly suspicious for cellulitis with sepsis: WBC 20k, tachycardic to the 120s.  Assessment & Plan   Sepsis secondary to LLE cellulitis -Patient tachycardic, with leukocytosis upon admission; had a low grade fever overnight -Patient does not recall any type of trauma or injury -Blood cultures show no growth to date -Leukocytosis improving -Continue Tylenol for fever, Dilaudid for pain -Continue vancomycin per pharmacy -Will continue to monitor CBC -Left lower extremity Doppler negative for DVT  Morbid obesity, BMP 56.4 -Patient will need to discuss lifestyle modifications and weight loss including diet and exercise with her primary care physician when discharged -Will consult nutrition  Elevated creatinine -possibly secondary to lab error vs sepsis vs vancomycin -Will repeat serum creatinine  Code Status: Full  Family Communication: None at bedside  Disposition Plan: Admitted  Time Spent in minutes   30 minutes  Procedures  LLE doppler: negative for DVT  Consults   None  DVT Prophylaxis  Heparin  Lab Results  Component Value Date   PLT 324 01/03/2014    Medications  Scheduled Meds: . heparin  5,000 Units Subcutaneous 3  times per day  . polyethylene glycol  17 g Oral Daily   Continuous Infusions: . sodium chloride 125 mL/hr at 01/03/14 0504   PRN Meds:.acetaminophen **OR** acetaminophen, HYDROmorphone (DILAUDID) injection  Antibiotics    Anti-infectives    Start     Dose/Rate Route Frequency Ordered Stop   01/02/14 1600  vancomycin (VANCOCIN) 1,250 mg in sodium chloride 0.9 % 250 mL IVPB  Status:  Discontinued     1,250 mg166.7 mL/hr over 90 Minutes Intravenous Every 8 hours 01/02/14 0652 01/03/14 0828   01/02/14 0645  vancomycin (VANCOCIN) 1,500 mg in sodium chloride 0.9 % 500 mL IVPB     1,500 mg250 mL/hr over 120 Minutes Intravenous  Once 01/02/14 0628 01/02/14 0857   01/02/14 0330  vancomycin (VANCOCIN) IVPB 1000 mg/200 mL premix    Comments:  Please obtain blood cultures prior to starting abx   1,000 mg200 mL/hr over 60 Minutes Intravenous  Once 01/02/14 0326 01/02/14 0500        Subjective:   Diane [name] seen and examined today.  Patient continues to complain of pain in her left leg, but feels the swelling has slightly improved.  Denies chest pain, shortness of breath,  abdominal pain.  Objective:   Filed Vitals:   01/02/14 0524 01/02/14 1300 01/02/14 2039 01/03/14 0504  BP: 145/76 142/73 123/64 135/85  Pulse: 112 107 107 108  Temp: 99.8 F (37.7 C) 100 F (37.8 C) 100.4 F (38 C) 100 F (37.8 C)  TempSrc: Oral Oral Oral Oral  Resp: 20 18 19 18   Height: 4\' 11"  (1.499 m)     Weight: 126.5 kg (278 lb 14.1 oz)     SpO2: 98% 97% 100% 96%    Wt Readings from Last 3 Encounters:  01/02/14 126.5 kg (278 lb 14.1 oz)  08/17/12 122.489 kg (270 lb 0.6 oz)  06/27/12 121.11 kg (267 lb)     Intake/Output Summary (Last 24 hours) at 01/03/14 1049 Last data filed at 01/03/14 1004  Gross per 24 hour  Intake   2350 ml  Output      0 ml  Net   2350 ml    Exam  General: Well developed, well nourished, NAD, appears stated age  88: NCAT,mucous membranes moist.    Cardiovascular: S1 S2 auscultated, no rubs, murmurs or gallops. Regular rate and rhythm.  Respiratory: Clear to auscultation bilaterally with equal chest rise  Abdomen: Soft, obese, nontender, nondistended, + bowel sounds  Extremities: warm dry without cyanosis clubbing. 2+ edema in LLE with erythema extending from the ankle to knee-improving slighly, warm and tender to touch.  Neuro: AAOx3, no focal deficits  Psych: Appropriate mood and affect  Data Review   Micro Results Recent Results (from the past 240 hour(s))  Blood culture (routine x 2)     Status: None (Preliminary result)   Collection Time: 01/02/14  3:35 AM  Result Value Ref Range Status   Specimen Description BLOOD LEFT HAND  Final   Special Requests BOTTLES DRAWN AEROBIC AND ANAEROBIC 5CC  Final   Culture  Setup Time   Final    01/02/2014 13:28 Performed at [facility]    Culture   Final           BLOOD CULTURE RECEIVED NO GROWTH TO DATE CULTURE WILL BE HELD FOR 5 DAYS BEFORE ISSUING A FINAL NEGATIVE REPORT Performed at [facility]    Report Status PENDING  Incomplete  Blood culture (routine x 2)     Status: None (Preliminary result)   Collection Time: 01/02/14  3:46 AM  Result Value Ref Range Status   Specimen Description BLOOD RIGHT ARM  Final   Special Requests BOTTLES DRAWN AEROBIC AND ANAEROBIC 5CC  Final   Culture  Setup Time   Final    01/02/2014 13:28 Performed at [facility]    Culture   Final           BLOOD CULTURE RECEIVED NO GROWTH TO DATE CULTURE WILL BE HELD FOR 5 DAYS BEFORE ISSUING A FINAL NEGATIVE REPORT Performed at [facility]    Report Status PENDING  Incomplete    Radiology Reports No results found.  CBC  Recent Labs Lab 01/02/14 0240 01/03/14 0455  WBC 20.0* 14.6*  HGB 12.3 11.0*  HCT 38.3 35.7*  PLT 305 324  MCV 82.5 83.2  MCH 26.5 25.6*  MCHC 32.1 30.8  RDW 16.1* 16.7*  LYMPHSABS 1.2  --   MONOABS 0.5  --   EOSABS 0.0  --    BASOSABS 0.0  --     Chemistries   Recent Labs Lab 01/02/14 0240 01/03/14 0455  NA 135 134*  K 3.7 3.6  CL  98 100  CO2 28 26  GLUCOSE 131* 107*  BUN 13 20  CREATININE 0.87 2.18*  CALCIUM 8.9 7.7*  AST 30  --   ALT 17  --   ALKPHOS 65  --   BILITOT 0.8  --    ------------------------------------------------------------------------------------------------------------------ estimated creatinine clearance is 41.8 mL/min (by C-G formula based on Cr of 2.18). ------------------------------------------------------------------------------------------------------------------ No results for input(s): HGBA1C in the last 72 hours. ------------------------------------------------------------------------------------------------------------------ No results for input(s): CHOL, HDL, LDLCALC, TRIG, CHOLHDL, LDLDIRECT in the last 72 hours. ------------------------------------------------------------------------------------------------------------------ No results for input(s): TSH, T4TOTAL, T3FREE, THYROIDAB in the last 72 hours.  Invalid input(s): FREET3 ------------------------------------------------------------------------------------------------------------------ No results for input(s): VITAMINB12, FOLATE, FERRITIN, TIBC, IRON, RETICCTPCT in the last 72 hours.  Coagulation profile No results for input(s): INR, PROTIME in the last 168 hours.  No results for input(s): DDIMER in the last 72 hours.  Cardiac Enzymes No results for input(s): CKMB, TROPONINI, MYOGLOBIN in the last 168 hours.  Invalid input(s): CK ------------------------------------------------------------------------------------------------------------------ Invalid input(s): POCBNP    Diane [name] D.O. on 01/03/2014 at 10:49 AM  Between 7am to 7pm - Pager - [phone]  After 7pm go to [email or web address] - password [email or web address]  And look for the night coverage person covering for me after hours  [facility]  Group Office  [phone]

## 2014-01-03 NOTE — Progress Notes (Signed)
CRITICAL VALUE ALERT  Critical value received:  Vancomycin Level 49.1  Date of notification:  01/03/2014  Time of notification:  16:25 pm   Critical value read back:Yes.    Nurse who received alert:  Faith Rogue, RN   MD notified (1st page):  Dr. Ree Kida  Time of first page:  16:26  MD notified (2nd page):  Time of second page:  Responding MD:  Dr. Ree Kida  Time MD responded:  16:43

## 2014-01-03 NOTE — Progress Notes (Signed)
PHARMACY - VANCOMYCIN (brief note)  Vancomycin regimen of 1250mg  IV q8h was held earlier due to abrupt rise in Scr (0.87 -> 2.18).  Last dose of Vancomycin 1250mg  was charted as given @ 08:03 this AM  Vancomycin Trough level @ 1500 today = 49.1 mcg/ml Scr = 2.42  Vancomycin Trough level goal = 15-20 mcg/ml  Plan:  Continue to hold Vancomycin            Will check random vancomycin level in the AM with already scheduled CBC and Bmet.  Will follow vancomycin levels and resume dosing once level down within desired range.  Leone Haven, PharmD 01/03/14 @ 16:41

## 2014-01-04 ENCOUNTER — Inpatient Hospital Stay (HOSPITAL_COMMUNITY): Payer: BC Managed Care – PPO

## 2014-01-04 DIAGNOSIS — N179 Acute kidney failure, unspecified: Secondary | ICD-10-CM

## 2014-01-04 DIAGNOSIS — K5900 Constipation, unspecified: Secondary | ICD-10-CM

## 2014-01-04 LAB — BASIC METABOLIC PANEL
Anion gap: 6 (ref 5–15)
BUN: 19 mg/dL (ref 6–23)
CHLORIDE: 105 meq/L (ref 96–112)
CO2: 25 mmol/L (ref 19–32)
CREATININE: 2.42 mg/dL — AB (ref 0.50–1.10)
Calcium: 7.5 mg/dL — ABNORMAL LOW (ref 8.4–10.5)
GFR calc Af Amer: 28 mL/min — ABNORMAL LOW (ref >90)
GFR calc non Af Amer: 24 mL/min — ABNORMAL LOW (ref >90)
GLUCOSE: 110 mg/dL — AB (ref 70–99)
Potassium: 3.8 mmol/L (ref 3.5–5.1)
Sodium: 136 mmol/L (ref 135–145)

## 2014-01-04 LAB — CBC
HEMATOCRIT: 32.8 % — AB (ref 36.0–46.0)
HEMOGLOBIN: 10.4 g/dL — AB (ref 12.0–15.0)
MCH: 26.1 pg (ref 26.0–34.0)
MCHC: 31.7 g/dL (ref 30.0–36.0)
MCV: 82.2 fL (ref 78.0–100.0)
Platelets: 290 10*3/uL (ref 150–400)
RBC: 3.99 MIL/uL (ref 3.87–5.11)
RDW: 16.4 % — ABNORMAL HIGH (ref 11.5–15.5)
WBC: 17.5 10*3/uL — ABNORMAL HIGH (ref 4.0–10.5)

## 2014-01-04 LAB — OSMOLALITY, URINE: Osmolality, Ur: 270 mOsm/kg — ABNORMAL LOW (ref 390–1090)

## 2014-01-04 LAB — CREATININE, URINE, RANDOM: Creatinine, Urine: 119.8 mg/dL

## 2014-01-04 MED ORDER — OXYCODONE HCL 5 MG PO TABS
5.0000 mg | ORAL_TABLET | Freq: Four times a day (QID) | ORAL | Status: DC | PRN
  Administered 2014-01-04 – 2014-01-08 (×13): 10 mg via ORAL
  Filled 2014-01-04 (×13): qty 2

## 2014-01-04 NOTE — Progress Notes (Signed)
[facility]                                                                              Patient Demographics  Diane [name], is a 38 y.o. female, DOB - 1975/01/31, [id number]  Admit date - 01/02/2014   Admitting Physician Etta Quill, DO  Outpatient Primary MD for the patient is Nance Pear., NP  LOS - 2   Chief Complaint  Patient presents with  . Leg Pain      HPI on 01/02/2014 by Dr. Jennette Kettle Diane [name] is a 38 y.o. female who presents to the ED with c/o LLE pain, redness, swelling. Symptoms onset 4 days ago, worsening since then, no injury, no SOB, no CP, no recent travel, no h/o DVT, no family h/o clotting disorders. She denies fever subjectively (objectively T in ED is 99.1). W/u in ED is highly suspicious for cellulitis with sepsis: WBC 20k, tachycardic to the 120s.  Assessment & Plan   Sepsis secondary to LLE cellulitis -Patient tachycardic, with leukocytosis upon admission; and febrile -continue to have low grade fever -Patient does not recall any type of trauma or injury -Blood cultures show no growth to date -Leukocytosis continue to be elevated -Continue Tylenol for fever, oxycodone and Dilaudid PRN for pain -Continue clindamycin per pharmacy -Will continue to monitor CBC -Left lower extremity Doppler negative for DVT  Morbid obesity, BMP 56.4 -Patient will need to discuss lifestyle modifications and weight loss including diet and exercise with her primary care physician when discharged -Will consult nutrition  Elevated creatinine -possibly secondary sepsis vs vancomycin -continue IVF's -vancomycin discontinue -follow cr trend -will check renal US and UA/urine cx  Constipation: continue miralax  Code Status: Full  Family Communication: None at bedside  Disposition Plan: home when medically stable  Time Spent in minutes   30 minutes  Procedures  LLE doppler: negative for DVT  Consults   None  DVT  Prophylaxis  Heparin  Lab Results  Component Value Date   PLT 290 01/04/2014    Medications  Scheduled Meds: . clindamycin (CLEOCIN) IV  600 mg Intravenous Q8H  . heparin  5,000 Units Subcutaneous 3 times per day  . polyethylene glycol  17 g Oral Daily   Continuous Infusions: . sodium chloride 125 mL/hr at 01/04/14 1529   PRN Meds:.acetaminophen **OR** acetaminophen, HYDROmorphone (DILAUDID) injection, oxyCODONE  Antibiotics    Anti-infectives    Start     Dose/Rate Route Frequency Ordered Stop   01/03/14 1730  clindamycin (CLEOCIN) IVPB 600 mg     600 mg100 mL/hr over 30 Minutes Intravenous Every 8 hours 01/03/14 1647     01/02/14 1600  vancomycin (VANCOCIN) 1,250 mg in sodium chloride 0.9 % 250 mL IVPB  Status:  Discontinued     1,250 mg166.7 mL/hr over 90 Minutes Intravenous Every 8 hours 01/02/14 0652 01/03/14 0828   01/02/14 0645  vancomycin (VANCOCIN) 1,500 mg in sodium chloride 0.9 % 500 mL IVPB     1,500 mg250 mL/hr over 120 Minutes Intravenous  Once 01/02/14 0628 01/02/14 0857   01/02/14 0330  vancomycin (VANCOCIN) IVPB 1000 mg/200 mL premix    Comments:  Please obtain blood cultures  prior to starting abx   1,000 mg200 mL/hr over 60 Minutes Intravenous  Once 01/02/14 0326 01/02/14 0500        Subjective:   [employer] seen and examined today.  Patient continues to complain of pain in her left leg, and reported current analgesics are not lasting enough.  Objective:   Filed Vitals:   01/03/14 2011 01/04/14 0437 01/04/14 1334 01/04/14 1529  BP: 133/66 138/74 172/92 140/74  Pulse: 109 108 112 112  Temp: 100.4 F (38 C) 100.1 F (37.8 C) 99.4 F (37.4 C)   TempSrc: Oral Oral Oral   Resp: 19 19 18    Height:      Weight:      SpO2: 100% 100% 92%     Wt Readings from Last 3 Encounters:  01/02/14 126.5 kg (278 lb 14.1 oz)  08/17/12 122.489 kg (270 lb 0.6 oz)  06/27/12 121.11 kg (267 lb)     Intake/Output Summary (Last 24 hours) at 01/04/14  1556 Last data filed at 01/04/14 1334  Gross per 24 hour  Intake   3180 ml  Output   1000 ml  Net   2180 ml    Exam  General: Well developed, well nourished, NAD, appears stated age; afebrile. Denies nausea/vomiting.   HEENT: NCAT,mucous membranes moist.   Cardiovascular: S1 S2 auscultated, no rubs, murmurs or gallops. Regular rate and rhythm.  Respiratory: Clear to auscultation bilaterally with equal chest rise  Abdomen: Soft, obese, nontender, nondistended, + bowel sounds  Extremities: warm dry without cyanosis clubbing. 2+ edema in LLE with erythema extending from the ankle to knee-improving slighly, warm and tender to touch.  Neuro: AAOx3, no focal deficits  Psych: Appropriate mood and affect  Data Review   Micro Results Recent Results (from the past 240 hour(s))  Blood culture (routine x 2)     Status: None (Preliminary result)   Collection Time: 01/02/14  3:35 AM  Result Value Ref Range Status   Specimen Description BLOOD LEFT HAND  Final   Special Requests BOTTLES DRAWN AEROBIC AND ANAEROBIC 5CC  Final   Culture  Setup Time   Final    01/02/2014 13:28 Performed at [facility]    Culture   Final           BLOOD CULTURE RECEIVED NO GROWTH TO DATE CULTURE WILL BE HELD FOR 5 DAYS BEFORE ISSUING A FINAL NEGATIVE REPORT Performed at [facility]    Report Status PENDING  Incomplete  Blood culture (routine x 2)     Status: None (Preliminary result)   Collection Time: 01/02/14  3:46 AM  Result Value Ref Range Status   Specimen Description BLOOD RIGHT ARM  Final   Special Requests BOTTLES DRAWN AEROBIC AND ANAEROBIC 5CC  Final   Culture  Setup Time   Final    01/02/2014 13:28 Performed at [facility]    Culture   Final           BLOOD CULTURE RECEIVED NO GROWTH TO DATE CULTURE WILL BE HELD FOR 5 DAYS BEFORE ISSUING A FINAL NEGATIVE REPORT Performed at [facility]    Report Status PENDING  Incomplete    Radiology Reports No  results found.  CBC  Recent Labs Lab 01/02/14 0240 01/03/14 0455 01/04/14 0400  WBC 20.0* 14.6* 17.5*  HGB 12.3 11.0* 10.4*  HCT 38.3 35.7* 32.8*  PLT 305 324 290  MCV 82.5 83.2 82.2  MCH 26.5 25.6* 26.1  MCHC 32.1 30.8 31.7  RDW 16.1* 16.7* 16.4*  LYMPHSABS 1.2  --   --   MONOABS 0.5  --   --   EOSABS 0.0  --   --   BASOSABS 0.0  --   --     Chemistries   Recent Labs Lab 01/02/14 0240 01/03/14 0455 01/03/14 1503 01/04/14 0400  NA 135 134*  --  136  K 3.7 3.6  --  3.8  CL 98 100  --  105  CO2 28 26  --  25  GLUCOSE 131* 107*  --  110*  BUN 13 20  --  19  CREATININE 0.87 2.18* 2.42* 2.42*  CALCIUM 8.9 7.7*  --  7.5*  AST 30  --   --   --   ALT 17  --   --   --   ALKPHOS 65  --   --   --   BILITOT 0.8  --   --   --    ------------------------------------------------------------------------------------------------------------------  ------------------------------------------------------------------------------------------------------------------  Barton Dubois MD on 01/04/2014 at 3:56 PM  Between 7am to 7pm - Pager - [phone]  After 7pm go to [email or web address] - password [email or web address]  And look for the night coverage person covering for me after hours  [facility] Office  [phone]

## 2014-01-04 NOTE — Progress Notes (Signed)
At [place], patient's sister showed concern that patient's cellulitis was worsening and "moving up her leg." Upon assessment, RN noted that patient did seem slightly more swollen. Patient's sister requested to speak to the MD. Dr. Dyann Kief paged with number to call back sister, but no call received. Night RN made aware of situation and will keep a close eye on swelling and have MD call sister in the AM if possible.

## 2014-01-05 DIAGNOSIS — R739 Hyperglycemia, unspecified: Secondary | ICD-10-CM

## 2014-01-05 LAB — URINALYSIS, ROUTINE W REFLEX MICROSCOPIC
Bilirubin Urine: NEGATIVE
Glucose, UA: NEGATIVE mg/dL
Ketones, ur: NEGATIVE mg/dL
Leukocytes, UA: NEGATIVE
Nitrite: NEGATIVE
PH: 5.5 (ref 5.0–8.0)
Protein, ur: 30 mg/dL — AB
SPECIFIC GRAVITY, URINE: 1.011 (ref 1.005–1.030)
Urobilinogen, UA: 2 mg/dL — ABNORMAL HIGH (ref 0.0–1.0)

## 2014-01-05 LAB — BASIC METABOLIC PANEL
ANION GAP: 7 (ref 5–15)
BUN: 17 mg/dL (ref 6–23)
CALCIUM: 7.2 mg/dL — AB (ref 8.4–10.5)
CO2: 23 mmol/L (ref 19–32)
CREATININE: 2 mg/dL — AB (ref 0.50–1.10)
Chloride: 100 mEq/L (ref 96–112)
GFR calc Af Amer: 35 mL/min — ABNORMAL LOW (ref >90)
GFR, EST NON AFRICAN AMERICAN: 30 mL/min — AB (ref >90)
GLUCOSE: 107 mg/dL — AB (ref 70–99)
Potassium: 3.4 mmol/L — ABNORMAL LOW (ref 3.5–5.1)
Sodium: 130 mmol/L — ABNORMAL LOW (ref 135–145)

## 2014-01-05 LAB — URINE MICROSCOPIC-ADD ON

## 2014-01-05 LAB — HEMOGLOBIN A1C
HEMOGLOBIN A1C: 5.7 % — AB (ref <5.7)
Mean Plasma Glucose: 117 mg/dL — ABNORMAL HIGH (ref <117)

## 2014-01-05 MED ORDER — CEFTRIAXONE SODIUM IN DEXTROSE 40 MG/ML IV SOLN
2.0000 g | INTRAVENOUS | Status: DC
  Administered 2014-01-05 – 2014-01-07 (×3): 2 g via INTRAVENOUS
  Filled 2014-01-05 (×4): qty 50

## 2014-01-05 MED ORDER — DOCUSATE SODIUM 100 MG PO CAPS
100.0000 mg | ORAL_CAPSULE | Freq: Two times a day (BID) | ORAL | Status: DC
  Administered 2014-01-05 – 2014-01-07 (×5): 100 mg via ORAL
  Filled 2014-01-05 (×7): qty 1

## 2014-01-05 MED ORDER — HYDROCORTISONE 0.5 % EX CREA
TOPICAL_CREAM | Freq: Two times a day (BID) | CUTANEOUS | Status: DC
  Administered 2014-01-06 – 2014-01-07 (×3): via TOPICAL
  Filled 2014-01-05: qty 28.35

## 2014-01-05 NOTE — Progress Notes (Signed)
[facility]                                                                              Patient Demographics  Diane [name], is a 38 y.o. female, DOB - [birth year], JTT:[id number]  Admit date - 01/02/2014   Admitting Physician Etta Quill, DO  Outpatient Primary MD for the patient is Nance Pear., NP  LOS - 3   Chief Complaint  Patient presents with  . Leg Pain      HPI on 01/02/2014 by Dr. Jennette Kettle Diane [name] is a 38 y.o. female who presents to the ED with c/o LLE pain, redness, swelling. Symptoms onset 4 days ago, worsening since then, no injury, no SOB, no CP, no recent travel, no h/o DVT, no family h/o clotting disorders. She denies fever subjectively (objectively T in ED is 99.1). W/u in ED is highly suspicious for cellulitis with sepsis: WBC 20k, tachycardic to the 120s.  Assessment & Plan   Sepsis secondary to LLE cellulitis -Patient tachycardic, with leukocytosis upon admission; and febrile -continue to have low grade fever, some improvement in [facility] (will follow trend) -Patient does not recall any type of trauma or injury -Blood cultures without growth to date -Continue Tylenol for fever, oxycodone and Dilaudid PRN for pain -Continue clindamycin per pharmacy and will add rocephin -wound care consulted -Will continue to monitor CBC -Left lower extremity Doppler negative for DVT  Morbid obesity, BMP 56.4 -Patient will need to discuss lifestyle modifications and weight loss including diet and exercise with her primary care physician when discharged -Will follow nutrition rec's  Elevated creatinine -possibly secondary sepsis vs vancomycin -continue IVF's at current rate for another 24 hours -Cr started to trend down  -vancomycin discontinue -Renal US w/o hydronephrosis, obstruction or any other abnormalities.  will check renal US and UA/urine cx pending.  Constipation: continue miralax and add colace BID  Hyperglycemia: will  check a1C.   Code Status: Full  Family Communication: None at bedside  Disposition Plan: home when medically stable  Time Spent in minutes   30 minutes  Procedures  LLE doppler: negative for DVT  Consults   None  DVT Prophylaxis  Heparin  Lab Results  Component Value Date   PLT 290 01/04/2014    Medications  Scheduled Meds: . cefTRIAXone (ROCEPHIN)  IV  2 g Intravenous Q24H  . clindamycin (CLEOCIN) IV  600 mg Intravenous Q8H  . heparin  5,000 Units Subcutaneous 3 times per day  . polyethylene glycol  17 g Oral Daily   Continuous Infusions: . sodium chloride 125 mL/hr at 01/05/14 0924   PRN Meds:.acetaminophen **OR** acetaminophen, HYDROmorphone (DILAUDID) injection, oxyCODONE  Antibiotics    Anti-infectives    Start     Dose/Rate Route Frequency Ordered Stop   01/05/14 1400  cefTRIAXone (ROCEPHIN) 2 g in dextrose 5 % 50 mL IVPB - Premix     2 g100 mL/hr over 30 Minutes Intravenous Every 24 hours 01/05/14 1224     01/03/14 1730  clindamycin (CLEOCIN) IVPB 600 mg     600 mg100 mL/hr over 30 Minutes Intravenous Every 8 hours 01/03/14 1647     01/02/14 1600  vancomycin (VANCOCIN)  1,250 mg in sodium chloride 0.9 % 250 mL IVPB  Status:  Discontinued     1,250 mg166.7 mL/hr over 90 Minutes Intravenous Every 8 hours 01/02/14 0652 01/03/14 0828   01/02/14 0645  vancomycin (VANCOCIN) 1,500 mg in sodium chloride 0.9 % 500 mL IVPB     1,500 mg250 mL/hr over 120 Minutes Intravenous  Once 01/02/14 0628 01/02/14 0857   01/02/14 0330  vancomycin (VANCOCIN) IVPB 1000 mg/200 mL premix    Comments:  Please obtain blood cultures prior to starting abx   1,000 mg200 mL/hr over 60 Minutes Intravenous  Once 01/02/14 0326 01/02/14 0500        Subjective:   [employer] seen and examined today.  Patient with pain in her LLE; no fever this morning (but with low grade fever overnight); no CP.   Objective:   Filed Vitals:   01/04/14 1334 01/04/14 1529 01/04/14 2040 01/05/14 0629   BP: 172/92 140/74 127/68 158/86  Pulse: 112 112 105 102  Temp: 99.4 F (37.4 C)  99.2 F (37.3 C) 98.4 F (36.9 C)  TempSrc: Oral  Oral Oral  Resp: 18  18 18   Height:      Weight:      SpO2: 92%  95% 100%    Wt Readings from Last 3 Encounters:  01/02/14 126.5 kg (278 lb 14.1 oz)  08/17/12 122.489 kg (270 lb 0.6 oz)  06/27/12 121.11 kg (267 lb)     Intake/Output Summary (Last 24 hours) at 01/05/14 1236 Last data filed at 01/05/14 1128  Gross per 24 hour  Intake   3340 ml  Output   1125 ml  Net   2215 ml    Exam  General: Well developed, obese, NAD, appears stated age; afebrile (but with low grade fever overnight). Denies nausea/vomiting.   HEENT: NCA, mucous membranes moist.   Cardiovascular: S1 S2 auscultated, no rubs, murmurs or gallops. Regular rate and rhythm.  Respiratory: Clear to auscultation bilaterally with equal chest rise  Abdomen: Soft, obese, nontender, nondistended, + bowel sounds  Extremities: warm dry without cyanosis clubbing. 2+ edema in LLE with erythema extending from the ankle to knee-improving slowly. Patient now with blisters formation and some skin decamation..  Neuro: AAOx3, no focal deficits  Psych: Appropriate mood and affect  Data Review   Micro Results Recent Results (from the past 240 hour(s))  Blood culture (routine x 2)     Status: None (Preliminary result)   Collection Time: 01/02/14  3:35 AM  Result Value Ref Range Status   Specimen Description BLOOD LEFT HAND  Final   Special Requests BOTTLES DRAWN AEROBIC AND ANAEROBIC 5CC  Final   Culture  Setup Time   Final    01/02/2014 13:28 Performed at [facility]    Culture   Final           BLOOD CULTURE RECEIVED NO GROWTH TO DATE CULTURE WILL BE HELD FOR 5 DAYS BEFORE ISSUING A FINAL NEGATIVE REPORT Performed at [facility]    Report Status PENDING  Incomplete  Blood culture (routine x 2)     Status: None (Preliminary result)   Collection Time: 01/02/14   3:46 AM  Result Value Ref Range Status   Specimen Description BLOOD RIGHT ARM  Final   Special Requests BOTTLES DRAWN AEROBIC AND ANAEROBIC 5CC  Final   Culture  Setup Time   Final    01/02/2014 13:28 Performed at [place]   Final  BLOOD CULTURE RECEIVED NO GROWTH TO DATE CULTURE WILL BE HELD FOR 5 DAYS BEFORE ISSUING A FINAL NEGATIVE REPORT Performed at [facility]    Report Status PENDING  Incomplete    Radiology Reports US Renal  01/04/2014   CLINICAL DATA:  Acute renal failure, history smoking  EXAM: RENAL/URINARY TRACT ULTRASOUND COMPLETE  COMPARISON:  None  FINDINGS: Right Kidney:  Length: 14.4 cm. Normal cortical thickness. Increased cortical echogenicity. No mass, hydronephrosis or shadowing calcification.  Left Kidney:  Length: 14.0 cm. Normal cortical thickness. Increased cortical echogenicity. No mass, hydronephrosis or shadowing calcification.  Bladder:  Normally distended without mass or wall thickening. BILATERAL ureteral jets noted.  IMPRESSION: Medical renal disease changes of both kidneys, which appear slightly enlarged.  No evidence of renal mass or hydronephrosis.   Electronically Signed   By: Lavonia Dana M.D.   On: 01/04/2014 20:33    CBC  Recent Labs Lab 01/02/14 0240 01/03/14 0455 01/04/14 0400  WBC 20.0* 14.6* 17.5*  HGB 12.3 11.0* 10.4*  HCT 38.3 35.7* 32.8*  PLT 305 324 290  MCV 82.5 83.2 82.2  MCH 26.5 25.6* 26.1  MCHC 32.1 30.8 31.7  RDW 16.1* 16.7* 16.4*  LYMPHSABS 1.2  --   --   MONOABS 0.5  --   --   EOSABS 0.0  --   --   BASOSABS 0.0  --   --     Chemistries   Recent Labs Lab 01/02/14 0240 01/03/14 0455 01/03/14 1503 01/04/14 0400 01/05/14 0450  NA 135 134*  --  136 130*  K 3.7 3.6  --  3.8 3.4*  CL 98 100  --  105 100  CO2 28 26  --  25 23  GLUCOSE 131* 107*  --  110* 107*  BUN 13 20  --  19 17  CREATININE 0.87 2.18* 2.42* 2.42* 2.00*  CALCIUM 8.9 7.7*  --  7.5* 7.2*  AST 30  --   --    --   --   ALT 17  --   --   --   --   ALKPHOS 65  --   --   --   --   BILITOT 0.8  --   --   --   --    ------------------------------------------------------------------------------------------------------------------  ------------------------------------------------------------------------------------------------------------------  Barton Dubois MD on 01/05/2014 at 12:36 PM  Between 7am to 7pm - Pager - [phone]  After 7pm go to [email or web address] - password [email or web address]  And look for the night coverage person covering for me after hours  [facility] Office  [phone]

## 2014-01-06 DIAGNOSIS — D72829 Elevated white blood cell count, unspecified: Secondary | ICD-10-CM

## 2014-01-06 LAB — BASIC METABOLIC PANEL
ANION GAP: 7 (ref 5–15)
BUN: 12 mg/dL (ref 6–23)
CHLORIDE: 102 meq/L (ref 96–112)
CO2: 24 mmol/L (ref 19–32)
Calcium: 7.5 mg/dL — ABNORMAL LOW (ref 8.4–10.5)
Creatinine, Ser: 1.76 mg/dL — ABNORMAL HIGH (ref 0.50–1.10)
GFR calc non Af Amer: 35 mL/min — ABNORMAL LOW (ref >90)
GFR, EST AFRICAN AMERICAN: 41 mL/min — AB (ref >90)
Glucose, Bld: 115 mg/dL — ABNORMAL HIGH (ref 70–99)
Potassium: 3.4 mmol/L — ABNORMAL LOW (ref 3.5–5.1)
SODIUM: 133 mmol/L — AB (ref 135–145)

## 2014-01-06 LAB — CBC
HCT: 29.8 % — ABNORMAL LOW (ref 36.0–46.0)
Hemoglobin: 9.4 g/dL — ABNORMAL LOW (ref 12.0–15.0)
MCH: 25.5 pg — ABNORMAL LOW (ref 26.0–34.0)
MCHC: 31.5 g/dL (ref 30.0–36.0)
MCV: 80.8 fL (ref 78.0–100.0)
PLATELETS: 375 10*3/uL (ref 150–400)
RBC: 3.69 MIL/uL — ABNORMAL LOW (ref 3.87–5.11)
RDW: 16.6 % — ABNORMAL HIGH (ref 11.5–15.5)
WBC: 16.8 10*3/uL — AB (ref 4.0–10.5)

## 2014-01-06 LAB — URINE CULTURE: Colony Count: 2000

## 2014-01-06 MED ORDER — HYDRALAZINE HCL 20 MG/ML IJ SOLN: 10.0000 mg | Freq: Four times a day (QID) | INTRAMUSCULAR | Status: DC | PRN

## 2014-01-06 MED ORDER — POTASSIUM CHLORIDE CRYS ER 20 MEQ PO TBCR
40.0000 meq | EXTENDED_RELEASE_TABLET | Freq: Once | ORAL | Status: AC
  Administered 2014-01-06: 40 meq via ORAL
  Filled 2014-01-06: qty 2

## 2014-01-06 NOTE — Progress Notes (Signed)
[facility]                                                                              Patient Demographics  Diane [name], is a 38 y.o. female, DOB - [birth year], CBU:[id number]  Admit date - 01/02/2014   Admitting Physician Etta Quill, DO  Outpatient Primary MD for the patient is Nance Pear., NP  LOS - 4   Chief Complaint  Patient presents with  . Leg Pain      HPI on 01/02/2014 by Dr. Jennette Kettle Diane [name] is a 38 y.o. female who presents to the ED with c/o LLE pain, redness, swelling. Symptoms onset 4 days ago, worsening since then, no injury, no SOB, no CP, no recent travel, no h/o DVT, no family h/o clotting disorders. She denies fever subjectively (objectively T in ED is 99.1). W/u in ED is highly suspicious for cellulitis with sepsis: WBC 20k, tachycardic to the 120s.  Assessment & Plan   Sepsis secondary to LLE cellulitis -Patient tachycardic, with leukocytosis upon admission; and febrile -continue to have low grade fever, some improvement in [facility] (will follow trend) -Patient does not recall any type of trauma or injury -Blood cultures without growth to date -Continue Tylenol for fever, oxycodone and Dilaudid PRN for pain -Continue clindamycin per pharmacy and rocephin -wound care consulted, will follow rec's -WBC's trending down and no further fever -Left lower extremity Doppler negative for DVT -home in 1-2 days  Morbid obesity, BMI 56.4 -Discussed with Patient importance of losing weight; low calorie diet and increase exercise -Will need to follow with PCP for further weight loss assistance  Elevated creatinine -possibly secondary sepsis and vancomycin -Cr continue now trending down; today 1.76 (12/26) -vancomycin has been discontinue -Renal US w/o hydronephrosis, obstruction or any other abnormalities.   -will adjust IVF's rate (down to 50cc/hr)  Constipation: continue miralax and add colace BID  Hyperglycemia: A1C  5.7 -patient advise to lose weight and to follow low carb diet  Code Status: Full  Family Communication: None at bedside  Disposition Plan: home when medically stable (1-2 days)  Time Spent in minutes   30 minutes  Procedures  LLE doppler: negative for DVT  Consults   None  DVT Prophylaxis  Heparin  Lab Results  Component Value Date   PLT 375 01/06/2014    Medications  Scheduled Meds: . cefTRIAXone (ROCEPHIN)  IV  2 g Intravenous Q24H  . clindamycin (CLEOCIN) IV  600 mg Intravenous Q8H  . docusate sodium  100 mg Oral BID  . heparin  5,000 Units Subcutaneous 3 times per day  . hydrocortisone cream   Topical BID  . polyethylene glycol  17 g Oral Daily   Continuous Infusions: . sodium chloride Stopped (01/06/14 1415)   PRN Meds:.acetaminophen **OR** acetaminophen, hydrALAZINE, HYDROmorphone (DILAUDID) injection, oxyCODONE  Antibiotics    Anti-infectives    Start     Dose/Rate Route Frequency Ordered Stop   01/05/14 1400  cefTRIAXone (ROCEPHIN) 2 g in dextrose 5 % 50 mL IVPB - Premix     2 g100 mL/hr over 30 Minutes Intravenous Every 24 hours 01/05/14 1224     01/03/14 1730  clindamycin (CLEOCIN)  IVPB 600 mg     600 mg100 mL/hr over 30 Minutes Intravenous Every 8 hours 01/03/14 1647     01/02/14 1600  vancomycin (VANCOCIN) 1,250 mg in sodium chloride 0.9 % 250 mL IVPB  Status:  Discontinued     1,250 mg166.7 mL/hr over 90 Minutes Intravenous Every 8 hours 01/02/14 0652 01/03/14 0828   01/02/14 0645  vancomycin (VANCOCIN) 1,500 mg in sodium chloride 0.9 % 500 mL IVPB     1,500 mg250 mL/hr over 120 Minutes Intravenous  Once 01/02/14 0628 01/02/14 0857   01/02/14 0330  vancomycin (VANCOCIN) IVPB 1000 mg/200 mL premix    Comments:  Please obtain blood cultures prior to starting abx   1,000 mg200 mL/hr over 60 Minutes Intravenous  Once 01/02/14 0326 01/02/14 0500        Subjective:   [employer] seen and examined today.  Patient with pain in her LLE slightly  better. No fever in the last 24 hours.   Objective:   Filed Vitals:   01/05/14 2224 01/06/14 0446 01/06/14 1419 01/06/14 1523  BP: 155/76 141/87 166/102 168/95  Pulse: 106 79 96 93  Temp: 99 F (37.2 C) 98.7 F (37.1 C) 98 F (36.7 C)   TempSrc: Oral Oral Oral   Resp: 20 20 20    Height:      Weight:      SpO2: 91% 97% 99%     Wt Readings from Last 3 Encounters:  01/02/14 126.5 kg (278 lb 14.1 oz)  08/17/12 122.489 kg (270 lb 0.6 oz)  06/27/12 121.11 kg (267 lb)     Intake/Output Summary (Last 24 hours) at 01/06/14 1606 Last data filed at 01/06/14 1448  Gross per 24 hour  Intake 3289.59 ml  Output   2000 ml  Net 1289.59 ml    Exam  General: Well developed, obese, NAD, appears stated age; afebrile for 24 hours now. Denies nausea/vomiting.   HEENT: NCA, mucous membranes moist.   Cardiovascular: S1 S2 auscultated, no rubs, murmurs or gallops. Regular rate and rhythm.  Respiratory: Clear to auscultation bilaterally with equal chest rise  Abdomen: Soft, obese, nontender, nondistended, + bowel sounds  Extremities: warm dry without cyanosis clubbing. 2+ edema in LLE with erythema extending from the ankle to knee-improving slowly. Patient now with blisters formation and some skin desquamation.  Neuro: AAOx3, no focal deficits  Psych: Appropriate mood and affect  Data Review   Micro Results Recent Results (from the past 240 hour(s))  Blood culture (routine x 2)     Status: None (Preliminary result)   Collection Time: 01/02/14  3:35 AM  Result Value Ref Range Status   Specimen Description BLOOD LEFT HAND  Final   Special Requests BOTTLES DRAWN AEROBIC AND ANAEROBIC 5CC  Final   Culture  Setup Time   Final    01/02/2014 13:28 Performed at [facility]    Culture   Final           BLOOD CULTURE RECEIVED NO GROWTH TO DATE CULTURE WILL BE HELD FOR 5 DAYS BEFORE ISSUING A FINAL NEGATIVE REPORT Performed at [facility]    Report Status PENDING   Incomplete  Blood culture (routine x 2)     Status: None (Preliminary result)   Collection Time: 01/02/14  3:46 AM  Result Value Ref Range Status   Specimen Description BLOOD RIGHT ARM  Final   Special Requests BOTTLES DRAWN AEROBIC AND ANAEROBIC 5CC  Final   Culture  Setup Time  Final    01/02/2014 13:28 Performed at [facility]    Culture   Final           BLOOD CULTURE RECEIVED NO GROWTH TO DATE CULTURE WILL BE HELD FOR 5 DAYS BEFORE ISSUING A FINAL NEGATIVE REPORT Performed at [facility]    Report Status PENDING  Incomplete  Culture, Urine     Status: None   Collection Time: 01/05/14 11:35 AM  Result Value Ref Range Status   Specimen Description URINE, CLEAN CATCH  Final   Special Requests NONE  Final   Colony Count   Final    2,000 COLONIES/ML Performed at [facility]    Culture   Final    INSIGNIFICANT GROWTH Performed at [facility]    Report Status 01/06/2014 FINAL  Final    Radiology Reports US Renal  01/04/2014   CLINICAL DATA:  Acute renal failure, history smoking  EXAM: RENAL/URINARY TRACT ULTRASOUND COMPLETE  COMPARISON:  None  FINDINGS: Right Kidney:  Length: 14.4 cm. Normal cortical thickness. Increased cortical echogenicity. No mass, hydronephrosis or shadowing calcification.  Left Kidney:  Length: 14.0 cm. Normal cortical thickness. Increased cortical echogenicity. No mass, hydronephrosis or shadowing calcification.  Bladder:  Normally distended without mass or wall thickening. BILATERAL ureteral jets noted.  IMPRESSION: Medical renal disease changes of both kidneys, which appear slightly enlarged.  No evidence of renal mass or hydronephrosis.   Electronically Signed   By: Lavonia Dana M.D.   On: 01/04/2014 20:33    CBC  Recent Labs Lab 01/02/14 0240 01/03/14 0455 01/04/14 0400 01/06/14 0517  WBC 20.0* 14.6* 17.5* 16.8*  HGB 12.3 11.0* 10.4* 9.4*  HCT 38.3 35.7* 32.8* 29.8*  PLT 305 324 290 375  MCV 82.5 83.2 82.2  80.8  MCH 26.5 25.6* 26.1 25.5*  MCHC 32.1 30.8 31.7 31.5  RDW 16.1* 16.7* 16.4* 16.6*  LYMPHSABS 1.2  --   --   --   MONOABS 0.5  --   --   --   EOSABS 0.0  --   --   --   BASOSABS 0.0  --   --   --     Chemistries   Recent Labs Lab 01/02/14 0240 01/03/14 0455 01/03/14 1503 01/04/14 0400 01/05/14 0450 01/06/14 0517  NA 135 134*  --  136 130* 133*  K 3.7 3.6  --  3.8 3.4* 3.4*  CL 98 100  --  105 100 102  CO2 28 26  --  25 23 24   GLUCOSE 131* 107*  --  110* 107* 115*  BUN 13 20  --  19 17 12   CREATININE 0.87 2.18* 2.42* 2.42* 2.00* 1.76*  CALCIUM 8.9 7.7*  --  7.5* 7.2* 7.5*  AST 30  --   --   --   --   --   ALT 17  --   --   --   --   --   ALKPHOS 65  --   --   --   --   --   BILITOT 0.8  --   --   --   --   --    ------------------------------------------------------------------------------------------------------------------  ------------------------------------------------------------------------------------------------------------------  Barton Dubois MD on 01/06/2014 at 4:06 PM  Between 7am to 7pm - Pager - [phone]  After 7pm go to [email or web address] - password [email or web address]  And look for the night coverage person covering for me after hours  [facility] Office  [phone]

## 2014-01-07 DIAGNOSIS — R7302 Impaired glucose tolerance (oral): Secondary | ICD-10-CM

## 2014-01-07 DIAGNOSIS — N179 Acute kidney failure, unspecified: Secondary | ICD-10-CM | POA: Insufficient documentation

## 2014-01-07 DIAGNOSIS — L03116 Cellulitis of left lower limb: Secondary | ICD-10-CM | POA: Insufficient documentation

## 2014-01-07 LAB — BASIC METABOLIC PANEL
ANION GAP: 8 (ref 5–15)
BUN: 9 mg/dL (ref 6–23)
CHLORIDE: 108 meq/L (ref 96–112)
CO2: 22 mmol/L (ref 19–32)
Calcium: 7.8 mg/dL — ABNORMAL LOW (ref 8.4–10.5)
Creatinine, Ser: 1.63 mg/dL — ABNORMAL HIGH (ref 0.50–1.10)
GFR calc Af Amer: 45 mL/min — ABNORMAL LOW (ref >90)
GFR calc non Af Amer: 39 mL/min — ABNORMAL LOW (ref >90)
Glucose, Bld: 99 mg/dL (ref 70–99)
Potassium: 3.8 mmol/L (ref 3.5–5.1)
Sodium: 138 mmol/L (ref 135–145)

## 2014-01-07 NOTE — Progress Notes (Signed)
PROGRESS NOTE  Diane [name] YYT:[id number] DOB: Apr 10, 1975 DOA: 01/02/2014 PCP: Nance Pear., NP  Assessment/Plan: Sepsis secondary to LLE cellulitis -Patient tachycardic, with leukocytosis upon admission; and febrile -continue to have low grade fever, some improvement in [facility] (will follow trend) -Patient does not recall any type of trauma or injury -Blood cultures without growth to date -Continue Tylenol for fever, oxycodone and Dilaudid PRN for pain -Continue clindamycin and rocephin -wound care consulted, will follow rec's -WBC's trending down and no further fever -Left lower extremity Doppler negative for DVT  Morbid obesity, BMI 56.4 -Discussed with Patient importance of losing weight; low calorie diet and increase exercise -Will need to follow with PCP for further weight loss assistance  AKI -possibly secondary sepsis and vancomycin -Cr continue now trending down; today 1.76 (12/26) -vancomycin has been discontinue -Renal US w/o hydronephrosis, obstruction or any other abnormalities.  -will adjust IVF's rate (down to 50cc/hr)  Constipation: continue miralax and add colace BID  Impaired Glucose Tolerance -A1C 5.7 -patient advise to lose weight and to follow low carb diet  Code Status: Full  Family Communication: Sister updated at bedside        Procedures/Studies: US Renal  01/04/2014   CLINICAL DATA:  Acute renal failure, history smoking  EXAM: RENAL/URINARY TRACT ULTRASOUND COMPLETE  COMPARISON:  None  FINDINGS: Right Kidney:  Length: 14.4 cm. Normal cortical thickness. Increased cortical echogenicity. No mass, hydronephrosis or shadowing calcification.  Left Kidney:  Length: 14.0 cm. Normal cortical thickness. Increased cortical echogenicity. No mass, hydronephrosis or shadowing calcification.  Bladder:  Normally distended without mass or wall thickening. BILATERAL ureteral jets noted.  IMPRESSION: Medical renal disease changes of  both kidneys, which appear slightly enlarged.  No evidence of renal mass or hydronephrosis.   Electronically Signed   By: Lavonia Dana M.D.   On: 01/04/2014 20:33         Subjective: Patient denies fevers, chills, headache, chest pain, dyspnea, nausea, vomiting, diarrhea, abdominal pain, dysuria, hematuria. Patient states that her lower extremity pain has significantly improved. She is afebrile. She is able to bear weight now.   Objective: Filed Vitals:   01/06/14 1523 01/06/14 2146 01/07/14 0525 01/07/14 1400  BP: 168/95 145/84 139/86 178/96  Pulse: 93 98 93 97  Temp:  97.6 F (36.4 C) 98.1 F (36.7 C) 98.1 F (36.7 C)  TempSrc:  Axillary Oral Oral  Resp:  20 20 18   Height:      Weight:      SpO2:  98% 100% 99%    Intake/Output Summary (Last 24 hours) at 01/07/14 1837 Last data filed at 01/07/14 1836  Gross per 24 hour  Intake 2709.16 ml  Output   2400 ml  Net 309.16 ml   Weight change:  Exam:   General:  Pt is alert, follows commands appropriately, not in acute distress  HEENT: No icterus, No thrush,[place]/AT  Cardiovascular: RRR, S1/S2, no rubs, no gallops  Respiratory: CTA bilaterally, no wheezing, no crackles, no rhonchi  Abdomen: Soft/+BS, non tender, non distended, no guarding  Extremities: 2+ LLE edema with erythema in the pretibial area without any necrosis or crepitance.   Data Reviewed: Basic Metabolic Panel:  Recent Labs Lab 01/03/14 0455 01/03/14 1503 01/04/14 0400 01/05/14 0450 01/06/14 0517 01/07/14 0459  NA 134*  --  136 130* 133* 138  K 3.6  --  3.8 3.4* 3.4* 3.8  CL 100  --  105 100 102 108  CO2 26  --  25 23 24 22   GLUCOSE 107*  --  110* 107* 115* 99  BUN 20  --  19 17 12 9   CREATININE 2.18* 2.42* 2.42* 2.00* 1.76* 1.63*  CALCIUM 7.7*  --  7.5* 7.2* 7.5* 7.8*   Liver Function Tests:  Recent Labs Lab 01/02/14 0240  AST 30  ALT 17  ALKPHOS 65  BILITOT 0.8  PROT 8.3  ALBUMIN 3.3*   No results for input(s): LIPASE, AMYLASE  in the last 168 hours. No results for input(s): AMMONIA in the last 168 hours. CBC:  Recent Labs Lab 01/02/14 0240 01/03/14 0455 01/04/14 0400 01/06/14 0517  WBC 20.0* 14.6* 17.5* 16.8*  NEUTROABS 18.4*  --   --   --   HGB 12.3 11.0* 10.4* 9.4*  HCT 38.3 35.7* 32.8* 29.8*  MCV 82.5 83.2 82.2 80.8  PLT 305 324 290 375   Cardiac Enzymes: No results for input(s): CKTOTAL, CKMB, CKMBINDEX, TROPONINI in the last 168 hours. BNP: Invalid input(s): POCBNP CBG: No results for input(s): GLUCAP in the last 168 hours.  Recent Results (from the past 240 hour(s))  Blood culture (routine x 2)     Status: None (Preliminary result)   Collection Time: 01/02/14  3:35 AM  Result Value Ref Range Status   Specimen Description BLOOD LEFT HAND  Final   Special Requests BOTTLES DRAWN AEROBIC AND ANAEROBIC 5CC  Final   Culture  Setup Time   Final    01/02/2014 13:28 Performed at [facility]    Culture   Final           BLOOD CULTURE RECEIVED NO GROWTH TO DATE CULTURE WILL BE HELD FOR 5 DAYS BEFORE ISSUING A FINAL NEGATIVE REPORT Performed at [facility]    Report Status PENDING  Incomplete  Blood culture (routine x 2)     Status: None (Preliminary result)   Collection Time: 01/02/14  3:46 AM  Result Value Ref Range Status   Specimen Description BLOOD RIGHT ARM  Final   Special Requests BOTTLES DRAWN AEROBIC AND ANAEROBIC 5CC  Final   Culture  Setup Time   Final    01/02/2014 13:28 Performed at [facility]    Culture   Final           BLOOD CULTURE RECEIVED NO GROWTH TO DATE CULTURE WILL BE HELD FOR 5 DAYS BEFORE ISSUING A FINAL NEGATIVE REPORT Performed at [facility]    Report Status PENDING  Incomplete  Culture, Urine     Status: None   Collection Time: 01/05/14 11:35 AM  Result Value Ref Range Status   Specimen Description URINE, CLEAN CATCH  Final   Special Requests NONE  Final   Colony Count   Final    2,000 COLONIES/ML Performed at [facility]    Culture   Final    INSIGNIFICANT GROWTH Performed at [facility]    Report Status 01/06/2014 FINAL  Final     Scheduled Meds: . cefTRIAXone (ROCEPHIN)  IV  2 g Intravenous Q24H  . clindamycin (CLEOCIN) IV  600 mg Intravenous Q8H  . docusate sodium  100 mg Oral BID  . heparin  5,000 Units Subcutaneous 3 times per day  . hydrocortisone cream   Topical BID  . polyethylene glycol  17 g Oral Daily   Continuous Infusions: . sodium chloride 50 mL/hr at 01/07/14 1429     Kiah Vanalstine, DO  [facility] Pager [phone]  If 7PM-7AM, please contact night-coverage [email or web address] Password [email or web address] 01/07/2014, 6:37 PM   LOS: 5 days

## 2014-01-07 NOTE — Consult Note (Addendum)
WOC wound consult note Reason for Consult: Consult requested for LLE with cellulitis. Wound type: Remains with pain, edema, and erythremia which has resolved slightly, according to patient. Measurement: Affected are to posterior calf is 14X4 cm.  Clear fluid-filled blisters are intact without open wounds or drainage at this time. Anterior calf without open wounds, blisters, or drainage. Dressing procedure/placement/frequency: Foam dressing to protect from further injury and absorb drainage if blisters rupture. Discussed plan of care with patient and she denies further questions at this time. Please re-consult if further assistance is needed.  Thank-you,  Julien Girt MSN, [place], [place], [place], [place]

## 2014-01-08 ENCOUNTER — Telehealth: Payer: Self-pay | Admitting: Family

## 2014-01-08 LAB — CBC
HCT: 30.2 % — ABNORMAL LOW (ref 36.0–46.0)
HEMOGLOBIN: 9.7 g/dL — AB (ref 12.0–15.0)
MCH: 26.1 pg (ref 26.0–34.0)
MCHC: 32.1 g/dL (ref 30.0–36.0)
MCV: 81.4 fL (ref 78.0–100.0)
PLATELETS: 422 10*3/uL — AB (ref 150–400)
RBC: 3.71 MIL/uL — AB (ref 3.87–5.11)
RDW: 16.9 % — ABNORMAL HIGH (ref 11.5–15.5)
WBC: 12.2 10*3/uL — ABNORMAL HIGH (ref 4.0–10.5)

## 2014-01-08 LAB — CULTURE, BLOOD (ROUTINE X 2)
Culture: NO GROWTH
Culture: NO GROWTH

## 2014-01-08 LAB — BASIC METABOLIC PANEL
ANION GAP: 4 — AB (ref 5–15)
BUN: 8 mg/dL (ref 6–23)
CO2: 28 mmol/L (ref 19–32)
CREATININE: 1.42 mg/dL — AB (ref 0.50–1.10)
Calcium: 8 mg/dL — ABNORMAL LOW (ref 8.4–10.5)
Chloride: 107 mEq/L (ref 96–112)
GFR calc non Af Amer: 46 mL/min — ABNORMAL LOW (ref >90)
GFR, EST AFRICAN AMERICAN: 53 mL/min — AB (ref >90)
Glucose, Bld: 98 mg/dL (ref 70–99)
POTASSIUM: 3.3 mmol/L — AB (ref 3.5–5.1)
Sodium: 139 mmol/L (ref 135–145)

## 2014-01-08 MED ORDER — DOXYCYCLINE HYCLATE 100 MG PO CAPS: 100.0000 mg | ORAL_CAPSULE | Freq: Two times a day (BID) | ORAL | Status: DC

## 2014-01-08 MED ORDER — METOPROLOL TARTRATE 25 MG PO TABS
25.0000 mg | ORAL_TABLET | Freq: Two times a day (BID) | ORAL | Status: DC
Start: 2014-01-08 — End: 2014-03-01

## 2014-01-08 MED ORDER — METOPROLOL TARTRATE 25 MG PO TABS
25.0000 mg | ORAL_TABLET | Freq: Two times a day (BID) | ORAL | Status: DC
  Administered 2014-01-08: 25 mg via ORAL
  Filled 2014-01-08: qty 1

## 2014-01-08 MED ORDER — OXYCODONE HCL 5 MG PO TABS: 5.0000 mg | ORAL_TABLET | Freq: Four times a day (QID) | ORAL | Status: DC | PRN

## 2014-01-08 MED ORDER — CLINDAMYCIN HCL 300 MG PO CAPS: 300.0000 mg | ORAL_CAPSULE | Freq: Three times a day (TID) | ORAL | Status: DC

## 2014-01-08 NOTE — Discharge Summary (Signed)
Physician Discharge Summary  Diane [name] SHF:[id number] DOB: Sep 24, 1975 DOA: 01/02/2014  PCP: Nance Pear., NP  Admit date: 01/02/2014 Discharge date: 01/08/2014  Recommendations for Outpatient Follow-up:  1. Pt will need to follow up with PCP in 2 weeks post discharge 2. Please obtain BMP and CBC in one week   Discharge Diagnoses:  Sepsis secondary to LLE cellulitis -Patient tachycardic, with leukocytosis upon admission; and febrile -continue to have low grade fever, some improvement in [facility] (will follow trend) -Patient does not recall any type of trauma or injury -Blood cultures without growth to date -Continue Tylenol for fever, oxycodone and Dilaudid PRN for pain -Continue clindamycin and rocephin IV during admissino -The patient will be discharged with clindamycin po and doxy po x 7 more days to complete 14 days of therapy -wound care consulted--cover fluid blisters with gauze dressing or foam dressing -WBC's trending down and no further fever--the patient remained afebrile for over 48 hours prior to discharge. WBC improved from 20,000-12.2 on the day of discharge -Left lower extremity Doppler negative for DVT  Morbid obesity, BMI 56.4 -Discussed with Patient importance of losing weight; low calorie diet and increase exercise -Will need to follow with PCP for further weight loss assistance  AKI -possibly secondary sepsis and vancomycin -Cr continue now trending down; today 1.76 (12/26) -vancomycin has been discontinue -Renal US w/o hydronephrosis, obstruction or any other abnormalities.  -will adjust IVF's rate (down to 50cc/hr) -Serum creatinine peaked at 2.18. Serum creatinine improved to 1.42 on the day of discharge -Recommend repeat BMP in 1 week  Constipation: continue miralax and add colace BID  Impaired Glucose Tolerance -A1C 5.7 -patient advise to lose weight and to follow low carb diet Hypertension -The patient was noted to have systolic  blood pressure into the 170s on numerous occasions during the hospitalization -She was started on metoprolol tartrate 25 mg twice a day  Discharge Condition: stable  Disposition: home  Diet:carb modified Wt Readings from Last 3 Encounters:  01/02/14 126.5 kg (278 lb 14.1 oz)  08/17/12 122.489 kg (270 lb 0.6 oz)  06/27/12 121.11 kg (267 lb)    History of present illness:  38 y.o. female who presents to the ED with c/o LLE pain, redness, swelling. Symptoms onset 4 days ago, worsening since then, no injury, no SOB, no CP, no recent travel, no h/o DVT, no family h/o clotting disorders. The patient was started on vancomycin. She developed worsening renal function and elevated serum creatinine. Vancomycin was discontinued. The patient was started on ceftriaxone and clindamycin IV. She clinically improved with decrease to be BCC and improved tachycardia. Discharge Exam: Filed Vitals:   01/08/14 0623  BP: 173/97  Pulse: 91  Temp: 97.6 F (36.4 C)  Resp: 20   Filed Vitals:   01/07/14 0525 01/07/14 1400 01/07/14 2204 01/08/14 0623  BP: 139/86 178/96 163/82 173/97  Pulse: 93 97 87 91  Temp: 98.1 F (36.7 C) 98.1 F (36.7 C) 98.5 F (36.9 C) 97.6 F (36.4 C)  TempSrc: Oral Oral Oral Oral  Resp: 20 18 20 20   Height:      Weight:      SpO2: 100% 99% 97% 100%   General: A&O x 3, NAD, pleasant, cooperative Cardiovascular: RRR, no rub, no gallop, no S3 Respiratory: CTAB, no wheeze, no rhonchi Abdomen:soft, nontender, nondistended, positive bowel sounds Extremities: Left lower extremity with 2+ edema and erythema from the ankle to the infrapatellar area without any crepitance. No purulent discharge areas.  Discharge Instructions  Medication List    TAKE these medications        clindamycin 300 MG capsule  Commonly known as:  CLEOCIN  Take 1 capsule (300 mg total) by mouth 3 (three) times daily.     doxycycline 100 MG capsule  Commonly known as:  VIBRAMYCIN  Take 1  capsule (100 mg total) by mouth 2 (two) times daily.     metoprolol tartrate 25 MG tablet  Commonly known as:  LOPRESSOR  Take 1 tablet (25 mg total) by mouth 2 (two) times daily.     oxyCODONE 5 MG immediate release tablet  Commonly known as:  Oxy IR/ROXICODONE  Take 1 tablet (5 mg total) by mouth every 6 (six) hours as needed for moderate pain.         The results of significant diagnostics from this hospitalization (including imaging, microbiology, ancillary and laboratory) are listed below for reference.    Significant Diagnostic Studies: US Renal  01/04/2014   CLINICAL DATA:  Acute renal failure, history smoking  EXAM: RENAL/URINARY TRACT ULTRASOUND COMPLETE  COMPARISON:  None  FINDINGS: Right Kidney:  Length: 14.4 cm. Normal cortical thickness. Increased cortical echogenicity. No mass, hydronephrosis or shadowing calcification.  Left Kidney:  Length: 14.0 cm. Normal cortical thickness. Increased cortical echogenicity. No mass, hydronephrosis or shadowing calcification.  Bladder:  Normally distended without mass or wall thickening. BILATERAL ureteral jets noted.  IMPRESSION: Medical renal disease changes of both kidneys, which appear slightly enlarged.  No evidence of renal mass or hydronephrosis.   Electronically Signed   By: Lavonia Dana M.D.   On: 01/04/2014 20:33     Microbiology: Recent Results (from the past 240 hour(s))  Blood culture (routine x 2)     Status: None (Preliminary result)   Collection Time: 01/02/14  3:35 AM  Result Value Ref Range Status   Specimen Description BLOOD LEFT HAND  Final   Special Requests BOTTLES DRAWN AEROBIC AND ANAEROBIC 5CC  Final   Culture  Setup Time   Final    01/02/2014 13:28 Performed at [facility]    Culture   Final           BLOOD CULTURE RECEIVED NO GROWTH TO DATE CULTURE WILL BE HELD FOR 5 DAYS BEFORE ISSUING A FINAL NEGATIVE REPORT Performed at [facility]    Report Status PENDING  Incomplete  Blood culture  (routine x 2)     Status: None (Preliminary result)   Collection Time: 01/02/14  3:46 AM  Result Value Ref Range Status   Specimen Description BLOOD RIGHT ARM  Final   Special Requests BOTTLES DRAWN AEROBIC AND ANAEROBIC 5CC  Final   Culture  Setup Time   Final    01/02/2014 13:28 Performed at [facility]    Culture   Final           BLOOD CULTURE RECEIVED NO GROWTH TO DATE CULTURE WILL BE HELD FOR 5 DAYS BEFORE ISSUING A FINAL NEGATIVE REPORT Performed at [facility]    Report Status PENDING  Incomplete  Culture, Urine     Status: None   Collection Time: 01/05/14 11:35 AM  Result Value Ref Range Status   Specimen Description URINE, CLEAN CATCH  Final   Special Requests NONE  Final   Colony Count   Final    2,000 COLONIES/ML Performed at [facility]    Culture   Final    INSIGNIFICANT GROWTH Performed at [facility]  Report Status 01/06/2014 FINAL  Final     Labs: Basic Metabolic Panel:  Recent Labs Lab 01/04/14 0400 01/05/14 0450 01/06/14 0517 01/07/14 0459 01/08/14 0404  NA 136 130* 133* 138 139  K 3.8 3.4* 3.4* 3.8 3.3*  CL 105 100 102 108 107  CO2 25 23 24 22 28   GLUCOSE 110* 107* 115* 99 98  BUN 19 17 12 9 8   CREATININE 2.42* 2.00* 1.76* 1.63* 1.42*  CALCIUM 7.5* 7.2* 7.5* 7.8* 8.0*   Liver Function Tests:  Recent Labs Lab 01/02/14 0240  AST 30  ALT 17  ALKPHOS 65  BILITOT 0.8  PROT 8.3  ALBUMIN 3.3*   No results for input(s): LIPASE, AMYLASE in the last 168 hours. No results for input(s): AMMONIA in the last 168 hours. CBC:  Recent Labs Lab 01/02/14 0240 01/03/14 0455 01/04/14 0400 01/06/14 0517 01/08/14 0404  WBC 20.0* 14.6* 17.5* 16.8* 12.2*  NEUTROABS 18.4*  --   --   --   --   HGB 12.3 11.0* 10.4* 9.4* 9.7*  HCT 38.3 35.7* 32.8* 29.8* 30.2*  MCV 82.5 83.2 82.2 80.8 81.4  PLT 305 324 290 375 422*   Cardiac Enzymes: No results for input(s): CKTOTAL, CKMB, CKMBINDEX, TROPONINI in the last 168  hours. BNP: Invalid input(s): POCBNP CBG: No results for input(s): GLUCAP in the last 168 hours.  Time coordinating discharge:  Greater than 30 minutes  Signed:  Domenik Trice, DO [facility] Pager: [phone] 01/08/2014, 7:09 AM

## 2014-01-08 NOTE — Telephone Encounter (Signed)
Please call and schedule a 2 week post-hospital visit with Earlie Counts. Thanks!

## 2014-01-08 NOTE — Telephone Encounter (Signed)
Phone note routed to gwen davis

## 2014-01-22 ENCOUNTER — Telehealth: Payer: Self-pay | Admitting: *Deleted

## 2014-01-22 NOTE — Telephone Encounter (Signed)
Received paperwork for STD benefits via fax from [place]. Filled out as much as possible and forwarded to [facility]. JG//CMA

## 2014-01-22 NOTE — Telephone Encounter (Signed)
Can you please schedule an appointment for patient with Melissa in order to have these forms completed.   Thanks, [employer]

## 2014-01-23 NOTE — Telephone Encounter (Signed)
Left detailed message informing patient that she needs to be seen for forms to be filled out.

## 2014-02-08 ENCOUNTER — Encounter (HOSPITAL_BASED_OUTPATIENT_CLINIC_OR_DEPARTMENT_OTHER): Payer: Self-pay | Admitting: *Deleted

## 2014-02-23 ENCOUNTER — Encounter (HOSPITAL_BASED_OUTPATIENT_CLINIC_OR_DEPARTMENT_OTHER): Payer: Self-pay | Admitting: *Deleted

## 2014-03-01 ENCOUNTER — Encounter (HOSPITAL_BASED_OUTPATIENT_CLINIC_OR_DEPARTMENT_OTHER): Payer: Self-pay | Admitting: *Deleted

## 2014-03-01 NOTE — Progress Notes (Signed)
NPO AFTER MN. ARRIVE AT 0600. NEEDS CBC, BMET, AND URINE PREG. WILL TAKE METOPROLOL AM DOS W/ SIPS OF WATER.

## 2014-03-01 NOTE — H&P (Signed)
  39 year old G 3 P 3 presents for [facility] BTL. Desires permanent sterilization.  Past Medical History  Diagnosis Date  . Hypertension    Past Surgical History  Procedure Laterality Date  . Cesarean section  02-04-2010;  11-30-2006;  07-12-2001   Review of patient's allergies indicates no known allergies. Family History  Problem Relation Age of Onset  . Cancer Mother     throat cancer  . Hypertension Mother   . Heart disease Father 61  . Stroke Father   . Cancer Maternal Aunt     breast cancer  . Cancer Maternal Uncle     prostate  . Diabetes Maternal Grandmother   . Kidney disease Neg Hx   . Cancer Maternal Uncle     throat cancer  . Cancer Maternal Aunt     breast cancer--in remision   History   Social History  . Marital Status: Single    Spouse Name: N/A  . Number of Children: 4  . Years of Education: N/A   Occupational History  .     Social History Main Topics  . Smoking status: Current Every Day Smoker -- 0.50 packs/day for 8 years    Types: Cigarettes  . Smokeless tobacco: Never Used  . Alcohol Use: Yes     Comment: 2-3 glasses of wine on weekend occasionally  . Drug Use: Not on file  . Sexual Activity: Not on file   Other Topics Concern  . Not on file   Social History Narrative   [employer]- works from home.     3 children- (55 year old adopted child) 31, 4 and 1.    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 No results found for this or any previous visit (from the past 24 hour(s)). Afebrile General alert and oriented Lung CTAB Car RRR Abdomen is soft and non tender Pelvic is WNL  IMPRESSION: Desires Permanent sterilization  PLAN: [facility] BTL Risks reviewed Consent signed

## 2014-03-02 ENCOUNTER — Ambulatory Visit (HOSPITAL_BASED_OUTPATIENT_CLINIC_OR_DEPARTMENT_OTHER)
Admission: RE | Admit: 2014-03-02 | Discharge: 2014-03-02 | Disposition: A | Discharge status: Home / Self Care | Payer: BLUE CROSS/BLUE SHIELD | Source: Ambulatory Visit | Attending: Obstetrics and Gynecology | Admitting: Obstetrics and Gynecology

## 2014-03-02 ENCOUNTER — Ambulatory Visit (HOSPITAL_BASED_OUTPATIENT_CLINIC_OR_DEPARTMENT_OTHER): Payer: BLUE CROSS/BLUE SHIELD | Admitting: Anesthesiology

## 2014-03-02 ENCOUNTER — Encounter (HOSPITAL_BASED_OUTPATIENT_CLINIC_OR_DEPARTMENT_OTHER): Payer: Self-pay | Admitting: *Deleted

## 2014-03-02 ENCOUNTER — Other Ambulatory Visit: Payer: Self-pay

## 2014-03-02 ENCOUNTER — Encounter (HOSPITAL_BASED_OUTPATIENT_CLINIC_OR_DEPARTMENT_OTHER)
Admission: RE | Disposition: A | Discharge status: Home / Self Care | Payer: Self-pay | Source: Ambulatory Visit | Attending: Obstetrics and Gynecology

## 2014-03-02 DIAGNOSIS — Z302 Encounter for sterilization: Secondary | ICD-10-CM | POA: Diagnosis not present

## 2014-03-02 DIAGNOSIS — F17210 Nicotine dependence, cigarettes, uncomplicated: Secondary | ICD-10-CM | POA: Diagnosis not present

## 2014-03-02 DIAGNOSIS — Z6841 Body Mass Index (BMI) 40.0 and over, adult: Secondary | ICD-10-CM | POA: Insufficient documentation

## 2014-03-02 DIAGNOSIS — R9431 Abnormal electrocardiogram [ECG] [EKG]: Secondary | ICD-10-CM | POA: Diagnosis not present

## 2014-03-02 DIAGNOSIS — I10 Essential (primary) hypertension: Secondary | ICD-10-CM | POA: Insufficient documentation

## 2014-03-02 DIAGNOSIS — Z8249 Family history of ischemic heart disease and other diseases of the circulatory system: Secondary | ICD-10-CM | POA: Insufficient documentation

## 2014-03-02 HISTORY — DX: Anxiety disorder, unspecified: F41.9

## 2014-03-02 HISTORY — DX: Essential (primary) hypertension: I10

## 2014-03-02 HISTORY — PX: LAPAROSCOPIC TUBAL LIGATION: SHX1937

## 2014-03-02 LAB — BASIC METABOLIC PANEL
Anion gap: 9 (ref 5–15)
BUN: 21 mg/dL (ref 6–23)
CALCIUM: 8.6 mg/dL (ref 8.4–10.5)
CO2: 25 mmol/L (ref 19–32)
CREATININE: 0.87 mg/dL (ref 0.50–1.10)
Chloride: 100 mmol/L (ref 96–112)
GFR calc Af Amer: >90 mL/min (ref >90)
GFR calc non Af Amer: 83 mL/min — ABNORMAL LOW (ref >90)
Glucose, Bld: 102 mg/dL — ABNORMAL HIGH (ref 70–99)
Potassium: 2.8 mmol/L — ABNORMAL LOW (ref 3.5–5.1)
Sodium: 134 mmol/L — ABNORMAL LOW (ref 135–145)

## 2014-03-02 LAB — CBC
HCT: 38 % (ref 36.0–46.0)
Hemoglobin: 12.5 g/dL (ref 12.0–15.0)
MCH: 27.5 pg (ref 26.0–34.0)
MCHC: 32.9 g/dL (ref 30.0–36.0)
MCV: 83.5 fL (ref 78.0–100.0)
PLATELETS: 374 10*3/uL (ref 150–400)
RBC: 4.55 MIL/uL (ref 3.87–5.11)
RDW: 16.2 % — AB (ref 11.5–15.5)
WBC: 6.2 10*3/uL (ref 4.0–10.5)

## 2014-03-02 LAB — POCT PREGNANCY, URINE: Preg Test, Ur: NEGATIVE

## 2014-03-02 SURGERY — LIGATION, FALLOPIAN TUBE, LAPAROSCOPIC
Anesthesia: General | Site: Abdomen | Laterality: Bilateral

## 2014-03-02 MED ORDER — MIDAZOLAM HCL 2 MG/2ML IJ SOLN
0.5000 mg | Freq: Once | INTRAMUSCULAR | Status: DC | PRN
  Filled 2014-03-02: qty 2

## 2014-03-02 MED ORDER — OXYCODONE-ACETAMINOPHEN 10-325 MG PO TABS: 1.0000 | ORAL_TABLET | ORAL | Status: DC | PRN

## 2014-03-02 MED ORDER — FENTANYL CITRATE 0.05 MG/ML IJ SOLN
INTRAMUSCULAR | Status: DC | PRN
  Administered 2014-03-02: 100 ug via INTRAVENOUS

## 2014-03-02 MED ORDER — DEXTROSE 5 % IV SOLN
3.0000 g | INTRAVENOUS | Status: AC
  Administered 2014-03-02: 3 g via INTRAVENOUS
  Filled 2014-03-02: qty 3000

## 2014-03-02 MED ORDER — PROPOFOL 10 MG/ML IV BOLUS
INTRAVENOUS | Status: DC | PRN
Start: 2014-03-02 — End: 2014-03-02
  Administered 2014-03-02: 200 mg via INTRAVENOUS

## 2014-03-02 MED ORDER — FENTANYL CITRATE 0.05 MG/ML IJ SOLN
INTRAMUSCULAR | Status: AC
  Filled 2014-03-02: qty 2

## 2014-03-02 MED ORDER — GLYCOPYRROLATE 0.2 MG/ML IJ SOLN
INTRAMUSCULAR | Status: DC | PRN
  Administered 2014-03-02: 0.6 mg via INTRAVENOUS

## 2014-03-02 MED ORDER — LACTATED RINGERS IV SOLN
INTRAVENOUS | Status: DC
  Filled 2014-03-02: qty 1000

## 2014-03-02 MED ORDER — DEXAMETHASONE SODIUM PHOSPHATE 4 MG/ML IJ SOLN
INTRAMUSCULAR | Status: DC | PRN
  Administered 2014-03-02: 10 mg via INTRAVENOUS

## 2014-03-02 MED ORDER — OXYCODONE-ACETAMINOPHEN 5-325 MG PO TABS
1.0000 | ORAL_TABLET | ORAL | Status: DC | PRN
  Administered 2014-03-02: 1 via ORAL
  Filled 2014-03-02: qty 1

## 2014-03-02 MED ORDER — BUPIVACAINE HCL (PF) 0.25 % IJ SOLN
INTRAMUSCULAR | Status: DC | PRN
  Administered 2014-03-02: 10 mL

## 2014-03-02 MED ORDER — LIDOCAINE HCL (CARDIAC) 20 MG/ML IV SOLN
INTRAVENOUS | Status: DC | PRN
  Administered 2014-03-02: 100 mg via INTRAVENOUS

## 2014-03-02 MED ORDER — LACTATED RINGERS IV SOLN
INTRAVENOUS | Status: DC
  Administered 2014-03-02: 07:00:00 via INTRAVENOUS
  Filled 2014-03-02: qty 1000

## 2014-03-02 MED ORDER — METOCLOPRAMIDE HCL 5 MG/ML IJ SOLN
INTRAMUSCULAR | Status: DC | PRN
  Administered 2014-03-02: 10 mg via INTRAVENOUS

## 2014-03-02 MED ORDER — MEPERIDINE HCL 25 MG/ML IJ SOLN
6.2500 mg | INTRAMUSCULAR | Status: DC | PRN
  Filled 2014-03-02: qty 1

## 2014-03-02 MED ORDER — MIDAZOLAM HCL 2 MG/2ML IJ SOLN
INTRAMUSCULAR | Status: AC
  Filled 2014-03-02: qty 2

## 2014-03-02 MED ORDER — NEOSTIGMINE METHYLSULFATE 10 MG/10ML IV SOLN
INTRAVENOUS | Status: DC | PRN
  Administered 2014-03-02: 4 mg via INTRAVENOUS

## 2014-03-02 MED ORDER — SUCCINYLCHOLINE CHLORIDE 20 MG/ML IJ SOLN
INTRAMUSCULAR | Status: DC | PRN
  Administered 2014-03-02: 100 mg via INTRAVENOUS

## 2014-03-02 MED ORDER — CEFAZOLIN SODIUM 1-5 GM-% IV SOLN
INTRAVENOUS | Status: AC
  Filled 2014-03-02: qty 50

## 2014-03-02 MED ORDER — OXYCODONE-ACETAMINOPHEN 5-325 MG PO TABS
ORAL_TABLET | ORAL | Status: AC
  Filled 2014-03-02: qty 1

## 2014-03-02 MED ORDER — KETOROLAC TROMETHAMINE 30 MG/ML IJ SOLN
INTRAMUSCULAR | Status: DC | PRN
  Administered 2014-03-02: 30 mg via INTRAVENOUS

## 2014-03-02 MED ORDER — SODIUM CHLORIDE 0.9 % IR SOLN
Status: DC | PRN
  Administered 2014-03-02: 500 mL

## 2014-03-02 MED ORDER — HYDROMORPHONE HCL 1 MG/ML IJ SOLN
0.2500 mg | INTRAMUSCULAR | Status: DC | PRN
  Filled 2014-03-02: qty 1

## 2014-03-02 MED ORDER — ROCURONIUM BROMIDE 100 MG/10ML IV SOLN
INTRAVENOUS | Status: DC | PRN
  Administered 2014-03-02: 35 mg via INTRAVENOUS

## 2014-03-02 MED ORDER — ONDANSETRON HCL 4 MG/2ML IJ SOLN
INTRAMUSCULAR | Status: DC | PRN
  Administered 2014-03-02: 4 mg via INTRAVENOUS

## 2014-03-02 MED ORDER — PROMETHAZINE HCL 25 MG/ML IJ SOLN
6.2500 mg | INTRAMUSCULAR | Status: DC | PRN
  Filled 2014-03-02: qty 1

## 2014-03-02 MED ORDER — CEFAZOLIN SODIUM-DEXTROSE 2-3 GM-% IV SOLR
INTRAVENOUS | Status: AC
  Filled 2014-03-02: qty 50

## 2014-03-02 SURGICAL SUPPLY — 61 items
APPLICATOR COTTON TIP 6IN STRL (MISCELLANEOUS) ×2 IMPLANT
BAG SPEC RTRVL LRG 6X4 10 (ENDOMECHANICALS)
BAG URINE DRAINAGE (UROLOGICAL SUPPLIES) IMPLANT
BANDAGE ADH SHEER 1  50/CT (GAUZE/BANDAGES/DRESSINGS) ×1 IMPLANT
BANDAGE ADHESIVE 1X3 (GAUZE/BANDAGES/DRESSINGS) IMPLANT
BLADE CLIPPER SURG (BLADE) ×1 IMPLANT
BLADE SURG 11 STRL SS (BLADE) ×2 IMPLANT
CANISTER SUCTION 2500CC (MISCELLANEOUS) IMPLANT
CATH FOLEY 2WAY SLVR  5CC 16FR (CATHETERS)
CATH FOLEY 2WAY SLVR 5CC 16FR (CATHETERS) IMPLANT
CATH ROBINSON RED A/P 16FR (CATHETERS) ×2 IMPLANT
CLIP FILSHIE TUBAL LIGA STRL (Clip) IMPLANT
CLOTH BEACON ORANGE TIMEOUT ST (SAFETY) ×2 IMPLANT
DRAPE CAMERA CLOSED 9X96 (DRAPES) ×1 IMPLANT
DRAPE UNDERBUTTOCKS STRL (DRAPE) ×2 IMPLANT
DRSG TEGADERM 4X4.75 (GAUZE/BANDAGES/DRESSINGS) ×1 IMPLANT
DRSG TELFA 3X8 NADH (GAUZE/BANDAGES/DRESSINGS) IMPLANT
ELECT REM PT RETURN 9FT ADLT (ELECTROSURGICAL) ×2
ELECTRODE REM PT RTRN 9FT ADLT (ELECTROSURGICAL) ×1 IMPLANT
GLOVE BIO SURGEON STRL SZ 6.5 (GLOVE) ×3 IMPLANT
GLOVE INDICATOR 6.5 STRL GRN (GLOVE) ×1 IMPLANT
GLOVE INDICATOR 7.5 STRL GRN (GLOVE) ×1 IMPLANT
GOWN PREVENTION PLUS LG XLONG (DISPOSABLE) ×4 IMPLANT
GOWN STRL REUS W/ TWL LRG LVL3 (GOWN DISPOSABLE) IMPLANT
GOWN STRL REUS W/ TWL XL LVL3 (GOWN DISPOSABLE) IMPLANT
GOWN STRL REUS W/TWL LRG LVL3 (GOWN DISPOSABLE) ×2
GOWN STRL REUS W/TWL XL LVL3 (GOWN DISPOSABLE) ×2
LIQUID BAND (GAUZE/BANDAGES/DRESSINGS) ×1 IMPLANT
NDL HYPO 25X1 1.5 SAFETY (NEEDLE) IMPLANT
NDL INSUFFLATION 14GA 120MM (NEEDLE) ×1 IMPLANT
NDL INSUFFLATION 14GA 150MM (NEEDLE) IMPLANT
NEEDLE HYPO 25X1 1.5 SAFETY (NEEDLE) IMPLANT
NEEDLE INSUFFLATION 14GA 120MM (NEEDLE) ×2 IMPLANT
NEEDLE INSUFFLATION 14GA 150MM (NEEDLE) IMPLANT
NS IRRIG 500ML POUR BTL (IV SOLUTION) ×2 IMPLANT
PACK BASIN DAY SURGERY FS (CUSTOM PROCEDURE TRAY) ×2 IMPLANT
PACK LAPAROSCOPY II (CUSTOM PROCEDURE TRAY) ×2 IMPLANT
PAD DRESSING TELFA 3X8 NADH (GAUZE/BANDAGES/DRESSINGS) IMPLANT
PAD OB MATERNITY 4.3X12.25 (PERSONAL CARE ITEMS) ×2 IMPLANT
PAD PREP 24X48 CUFFED NSTRL (MISCELLANEOUS) ×2 IMPLANT
POUCH SPECIMEN RETRIEVAL 10MM (ENDOMECHANICALS) IMPLANT
SCISSORS LAP 5X35 DISP (ENDOMECHANICALS) IMPLANT
SET IRRIG TUBING LAPAROSCOPIC (IRRIGATION / IRRIGATOR) IMPLANT
SOLUTION ANTI FOG 6CC (MISCELLANEOUS) ×2 IMPLANT
SPONGE GAUZE 4X4 12PLY STER LF (GAUZE/BANDAGES/DRESSINGS) ×1 IMPLANT
STRIP CLOSURE SKIN 1/4X4 (GAUZE/BANDAGES/DRESSINGS) IMPLANT
SUT VIC AB 3-0 PS2 18 (SUTURE) ×2
SUT VIC AB 3-0 PS2 18XBRD (SUTURE) ×1 IMPLANT
SUT VICRYL 0 UR6 27IN ABS (SUTURE) ×2 IMPLANT
SYR CONTROL 10ML LL (SYRINGE) ×1 IMPLANT
SYRINGE 10CC LL (SYRINGE) ×5 IMPLANT
TOWEL OR 17X24 6PK STRL BLUE (TOWEL DISPOSABLE) ×4 IMPLANT
TRAY DSU PREP LF (CUSTOM PROCEDURE TRAY) ×2 IMPLANT
TROCAR 12M 150ML BLUNT (TROCAR) IMPLANT
TROCAR OPTI TIP 5M 100M (ENDOMECHANICALS) ×1 IMPLANT
TROCAR XCEL BLUNT TIP 100MML (ENDOMECHANICALS) ×1 IMPLANT
TROCAR XCEL DIL TIP R 11M (ENDOMECHANICALS) ×1 IMPLANT
TROCAR XCEL NON-BLD 11X100MML (ENDOMECHANICALS) IMPLANT
TUBING INSUFFLATION 10FT LAP (TUBING) ×2 IMPLANT
VACUUM HOSE/TUBING 7/8INX6FT (MISCELLANEOUS) IMPLANT
WATER STERILE IRR 500ML POUR (IV SOLUTION) ×1 IMPLANT

## 2014-03-02 NOTE — Progress Notes (Signed)
H and P on the chart No significant changes Will proceed with [facility] BTL Consent signed

## 2014-03-02 NOTE — Discharge Instructions (Signed)
°  Laparoscopic Tubal Ligation Care After Refer to this sheet in the next few weeks. These instructions provide you with information on caring for yourself after your procedure. Your caregiver may also give you more specific instructions. Your treatment has been planned according to current medical practices, but problems sometimes occur. Call your caregiver if you have any problems or questions after your procedure. HOME CARE INSTRUCTIONS   Rest the remainder of the day.  Only take over-the-counter or prescription medicines for pain, discomfort, or fever as directed by your caregiver. Do not take aspirin. It can cause bleeding.  Gradually resume daily activities, diet, rest, driving, and work.  Avoid sexual intercourse for 2 weeks or as directed.  Do not use tampons or douche.  Do not drive while taking pain medicine.  Do not lift anything over 5 pounds for 2 weeks or as directed.  Only take showers, not baths, until you are seen by your caregiver.  Change bandages (dressings) as directed.  Take your temperature twice a day and record it.  Try to have help for the first 7 to 10 days for your household needs.  Return to your caregiver to get your stitches (sutures) removed and for follow-up visits as directed. SEEK MEDICAL CARE IF:   You have redness, swelling, or increasing pain in a wound.  You have drainage from a wound lasting longer than 1 day.  Your pain is getting worse.  You have a rash.  You become dizzy or lightheaded.  You have a reaction to your medicine.  You need stronger medicine or a change in your pain medicine.  You notice a bad smell coming from a wound or dressing.  Your wound breaks open after the sutures have been removed.  You are constipated. SEEK IMMEDIATE MEDICAL CARE IF:   You faint.  You have a fever.  You have increasing abdominal pain.  You have severe pain in your shoulders.  You have bleeding or drainage from the suture sites  or vagina following surgery.  You have shortness of breath or difficulty breathing.  You have chest or leg pain.  You have persistent nausea, vomiting, or diarrhea. MAKE SURE YOU:   Understand these instructions.  Watch your condition.  Get help right away if you are not doing well or get worse. Document Released: 07/18/2004 Document Revised: 06/30/2011 Document Reviewed: 04/11/2011 [facility] Patient Information 2015 [place], [place]. This information is not intended to replace advice given to you by your health care provider. Make sure you discuss any questions you have with your health care provider.    Post Anesthesia Home Care Instructions  Activity: Get plenty of rest for the remainder of the day. A responsible adult should stay with you for 24 hours following the procedure.  For the next 24 hours, DO NOT: -Drive a car -Paediatric nurse -Drink alcoholic beverages -Take any medication unless instructed by your physician -Make any legal decisions or sign important papers.  Meals: Start with liquid foods such as gelatin or soup. Progress to regular foods as tolerated. Avoid greasy, spicy, heavy foods. If nausea and/or vomiting occur, drink only clear liquids until the nausea and/or vomiting subsides. Call your physician if vomiting continues.  Special Instructions/Symptoms: Your throat may feel dry or sore from the anesthesia or the breathing tube placed in your throat during surgery. If this causes discomfort, gargle with warm salt water. The discomfort should disappear within 24 hours.

## 2014-03-02 NOTE — Brief Op Note (Signed)
03/02/2014  8:28 AM  PATIENT:  Diane [name]  39 y.o. female  PRE-OPERATIVE DIAGNOSIS:  desires sterlization  POST-OPERATIVE DIAGNOSIS:  desires sterlization  PROCEDURE:  Procedure(s): BILATERAL LAPAROSCOPIC TUBAL LIGATION (Bilateral)  SURGEON:  Surgeon(s) and Role:    * Cyril Mourning, MD - Primary  PHYSICIAN ASSISTANT:   ASSISTANTS: none   ANESTHESIA:   general  EBL:     BLOOD ADMINISTERED:none  DRAINS: none   LOCAL MEDICATIONS USED:  MARCAINE     SPECIMEN:  No Specimen  DISPOSITION OF SPECIMEN:  N/A  COUNTS:  YES  TOURNIQUET:  * No tourniquets in log *  DICTATION: .Other Dictation: Dictation Number [id number]  PLAN OF CARE: Discharge to home after PACU  PATIENT DISPOSITION:  PACU - hemodynamically stable.   Delay start of Pharmacological VTE agent (>24hrs) due to surgical blood loss or risk of bleeding: not applicable

## 2014-03-02 NOTE — Transfer of Care (Signed)
Immediate Anesthesia Transfer of Care Note  Patient: Diane [name]  Procedure(s) Performed: Procedure(s): BILATERAL LAPAROSCOPIC TUBAL LIGATION (Bilateral)  Patient Location: PACU  Anesthesia Type:General  Level of Consciousness: awake, alert , oriented and patient cooperative  Airway & Oxygen Therapy: Patient Spontanous Breathing and Patient connected to nasal cannula oxygen  Post-op Assessment: Report given to RN and Post -op Vital signs reviewed and stable  Post vital signs: Reviewed and stable  Last Vitals:  Filed Vitals:   03/02/14 0609  BP: 146/72  Pulse: 100  Temp: 36.6 C  Resp: 16    Complications: No apparent anesthesia complications

## 2014-03-02 NOTE — Anesthesia Preprocedure Evaluation (Addendum)
Anesthesia Evaluation  Patient identified by MRN, date of birth, ID band Patient awake    Reviewed: Allergy & Precautions, NPO status , Patient's Chart, lab work & pertinent test results  History of Anesthesia Complications Negative for: history of anesthetic complications  Airway Mallampati: II  TM Distance: >3 FB Neck ROM: Full    Dental  (+) Teeth Intact, Dental Advisory Given   Pulmonary Current Smoker,  breath sounds clear to auscultation        Cardiovascular hypertension, Pt. on medications and Pt. on home beta blockers - anginaRhythm:Regular Rate:Normal  02-Mar-2014  Normal sinus rhythm T wave abnormality, consider inferior ischemia Prolonged QT Abnormal ECG   Neuro/Psych  Headaches, Anxiety    GI/Hepatic negative GI ROS, Neg liver ROS,   Endo/Other  Morbid obesity  Renal/GU Renal InsufficiencyRenal diseasenegative Renal ROS     Musculoskeletal   Abdominal (+) + obese,   Peds  Hematology   Anesthesia Other Findings K+ 2.8 this am Discussed with Dr Ellamae Sia for pt council w/ diuretic  Reproductive/Obstetrics 03/02/14 preg test: neg                       Anesthesia Physical Anesthesia Plan  ASA: III  Anesthesia Plan: General   Post-op Pain Management:    Induction: Intravenous  Airway Management Planned: Oral ETT  Additional Equipment:   Intra-op Plan:   Post-operative Plan: Extubation in OR  Informed Consent: I have reviewed the patients History and Physical, chart, labs and discussed the procedure including the risks, benefits and alternatives for the proposed anesthesia with the patient or authorized representative who has indicated his/her understanding and acceptance.   Dental advisory given  Plan Discussed with: CRNA and Surgeon  Anesthesia Plan Comments: (Plan routine monitors, GETA)        Anesthesia Quick Evaluation

## 2014-03-02 NOTE — Anesthesia Postprocedure Evaluation (Signed)
  Anesthesia Post-op Note  Patient: Diane [name]  Procedure(s) Performed: Procedure(s): BILATERAL LAPAROSCOPIC TUBAL LIGATION (Bilateral)  Patient Location: PACU  Anesthesia Type:General  Level of Consciousness: awake, alert , oriented and patient cooperative  Airway and Oxygen Therapy: Patient Spontanous Breathing  Post-op Pain: none  Post-op Assessment: Post-op Vital signs reviewed, Patient's Cardiovascular Status Stable, Respiratory Function Stable, Patent Airway, No signs of Nausea or vomiting and Pain level controlled  Post-op Vital Signs: Reviewed and stable  Last Vitals:  Filed Vitals:   03/02/14 0915  BP: 130/80  Pulse: 76  Temp:   Resp: 18    Complications: No apparent anesthesia complications

## 2014-03-02 NOTE — Anesthesia Procedure Notes (Signed)
Procedure Name: Intubation Date/Time: 03/02/2014 7:29 AM Performed by: Wanita Chamberlain Pre-anesthesia Checklist: Patient identified, Timeout performed, Emergency Drugs available, Suction available and Patient being monitored Patient Re-evaluated:Patient Re-evaluated prior to inductionIntubation Type: IV induction Ventilation: Mask ventilation without difficulty Laryngoscope Size: Mac and 3 Grade View: Grade I Tube type: Oral Tube size: 7.0 mm Number of attempts: 1 Airway Equipment and Method: Patient positioned with wedge pillow and Stylet Placement Confirmation: ETT inserted through vocal cords under direct vision,  positive ETCO2 and breath sounds checked- equal and bilateral Secured at: 20 cm Tube secured with: Tape Dental Injury: Teeth and Oropharynx as per pre-operative assessment

## 2014-03-03 NOTE — Op Note (Signed)
NAMESHAMEEKA, SILLIMAN            ACCOUNT NO.:  [id number]  MEDICAL RECORD NO.:  [id number]  LOCATION:                               FACILITY:  [facility]  PHYSICIAN:  [place] Mylasia Vorhees, M.D.DATE OF BIRTH:  [birth year]  DATE OF PROCEDURE:  03/02/2014 DATE OF DISCHARGE:  03/02/2014                              OPERATIVE REPORT   PREOPERATIVE DIAGNOSIS:  Desires permanent sterilization.  POSTOPERATIVE DIAGNOSIS:  Desires permanent sterilization.  PROCEDURE:  Open laparoscopy and bilateral tubal ligation with the Kleppinger forceps.  SURGEON:  Carsin Randazzo L. Helane Rima, M.D.  ANESTHESIA:  General.  ESTIMATED BLOOD LOSS:  Minimal.  COMPLICATIONS:  None.  DESCRIPTION OF PROCEDURE:  The patient was taken to the operating room. She was intubated.  She was prepped and draped.  In and out catheter was used to empty the bladder.  Tenaculum was placed on the uterus and attention was turned to the abdomen, where a small infraumbilical incision was made.  The Veress needle was inserted.  The pneumoperitoneum was performed easily.  The Veress needle was removed and the trocar was inserted.  Because of the patient's body habitus, I did advance the laparoscope through the trocar sheath and I could say that she had a large portion of omentum adherent to the anterior abdominal wall.  This patient has had 3 prior cesarean sections in the past.  Because of limited visualization, I switched to a Hasson trocar, inserted the scope, then I was able to introduce and move the scope past the omental adhesion.  There was no area of bleeding noted.  I then examined the pelvis and noted that she had dense adhesions involving the anterior lower uterine segment to the anterior abdominal wall.  The uterus was enlarged and myomatous.  Visualization was limited.  I then placed the patient in Trendelenburg position.  I was then able to visualize the right fallopian tube and right ovary, placed a Kleppinger forceps  across the midportion of that and performed a tubal ligation using the triple burn technique.  After the tubal was performed on the right, I then moved the uterus as much as I could laterally to the right and I was able to identify the left fallopian tube.  There were some adhesions involving in the left side, but I was able to identify that and easily lift up the left fallopian tube midsection and performed a tubal ligation on that side as well.  After the bilateral tubal ligation was removed, we then removed the laparoscopic, removed as much pneumoperitoneum as possible, placed an 0 Vicryl stitch through the fascia and closed that in a figure-of-eight and then closed the skin with 3-0 Vicryl interrupted and then placed Dermabond at both site.  All instruments removed from the vagina.  All sponge, lap, and instrument counts were correct x2.  The patient went to recovery room in stable condition.     Archit Leger L. Helane Rima, M.D.     Nevin Bloodgood  D:  03/02/2014  T:  03/03/2014  Job:  [id number]

## 2014-03-05 ENCOUNTER — Encounter (HOSPITAL_BASED_OUTPATIENT_CLINIC_OR_DEPARTMENT_OTHER): Payer: Self-pay | Admitting: Obstetrics and Gynecology

## 2014-06-15 IMAGING — MG MM DIGITAL SCREENING BILAT
5 series · 5 of 5 positions shown · non-contrast
Comparison: None.

CLINICAL DATA: Screening. Baseline.

DIGITAL BILATERAL SCREENING MAMMOGRAM WITH CAD

[R CC]
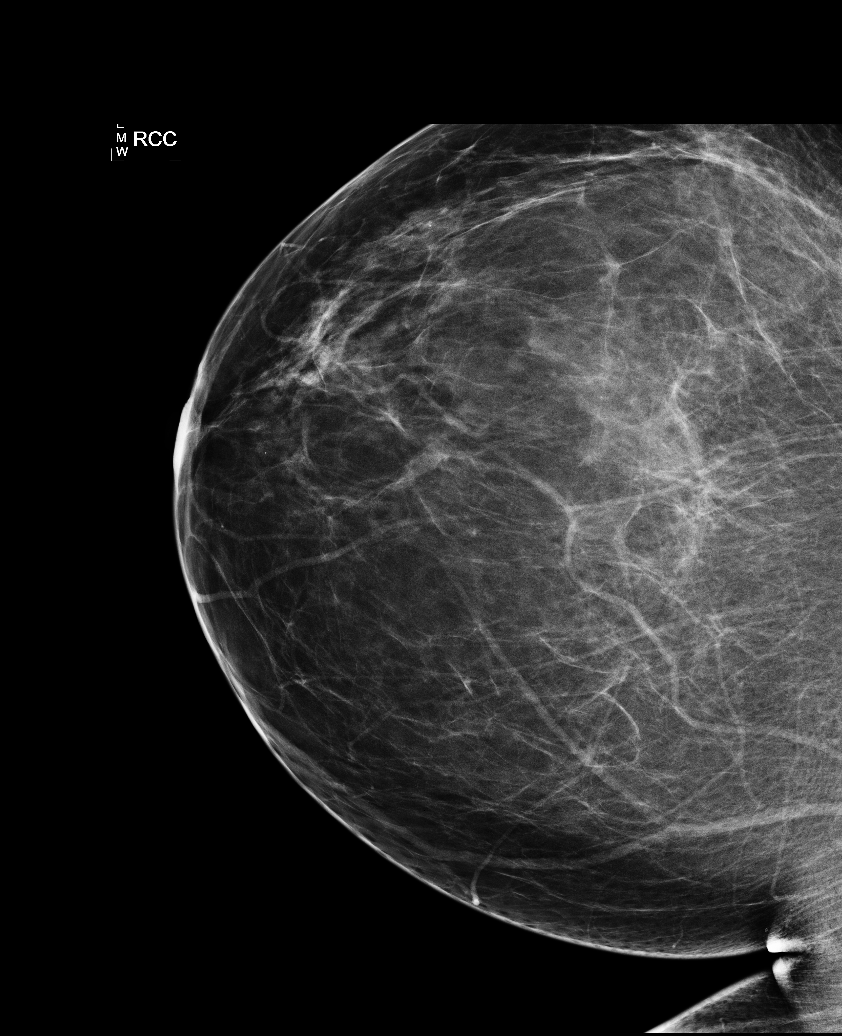

[L CC (1 of 2)]
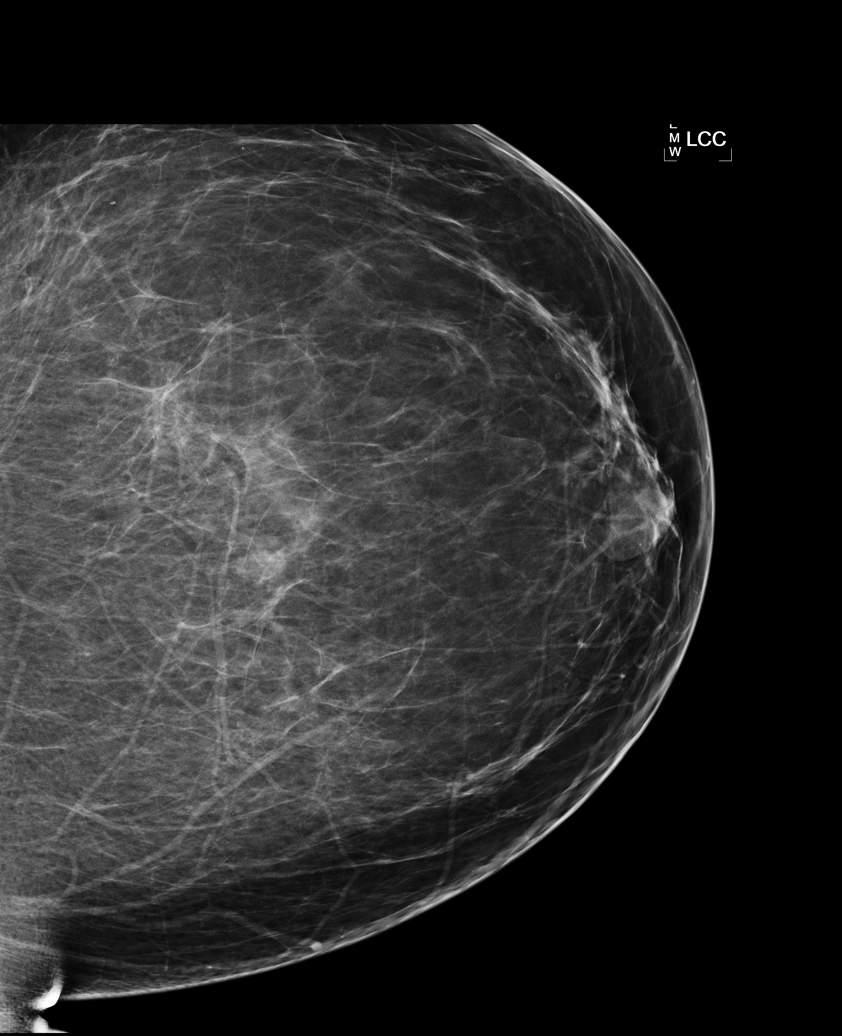

[L MLO]
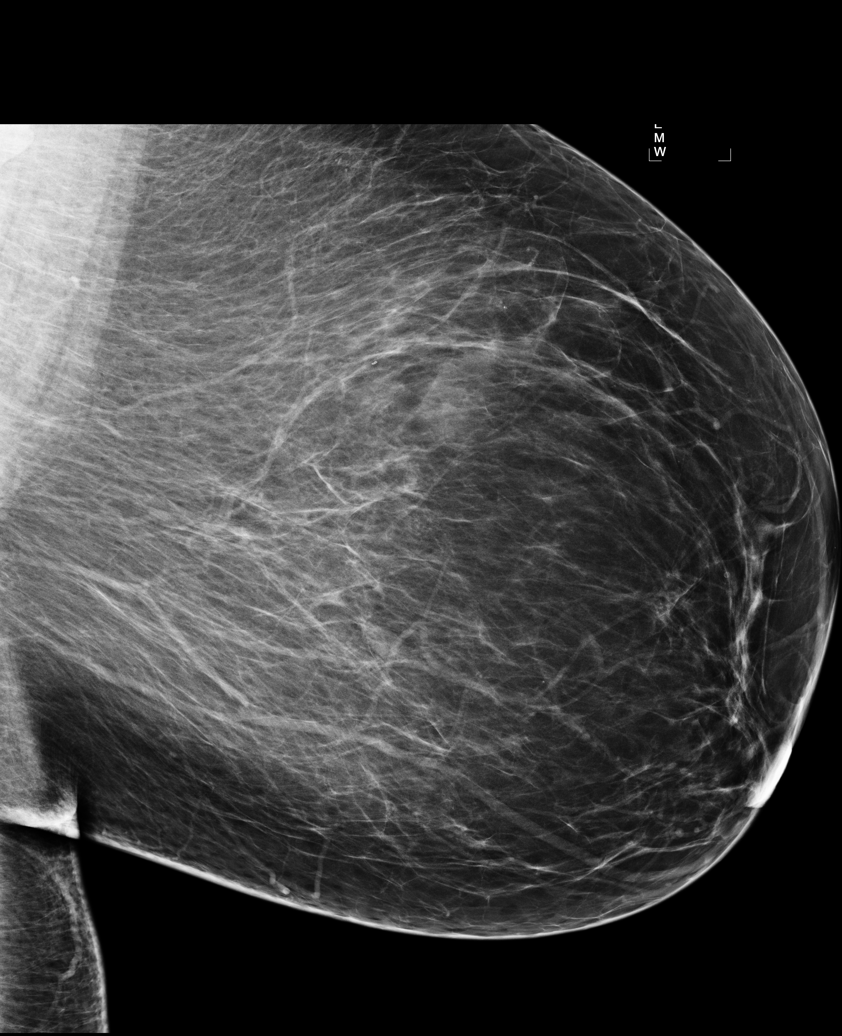

[R MLO]
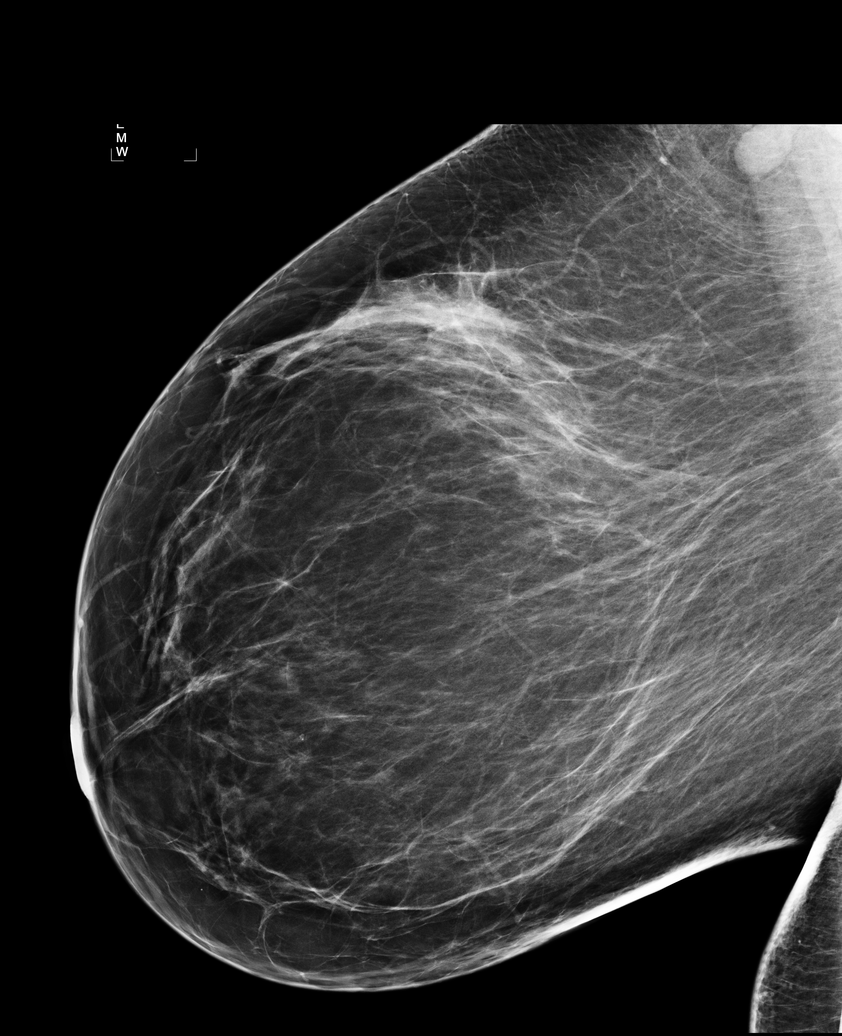

[L CC (2 of 2)]
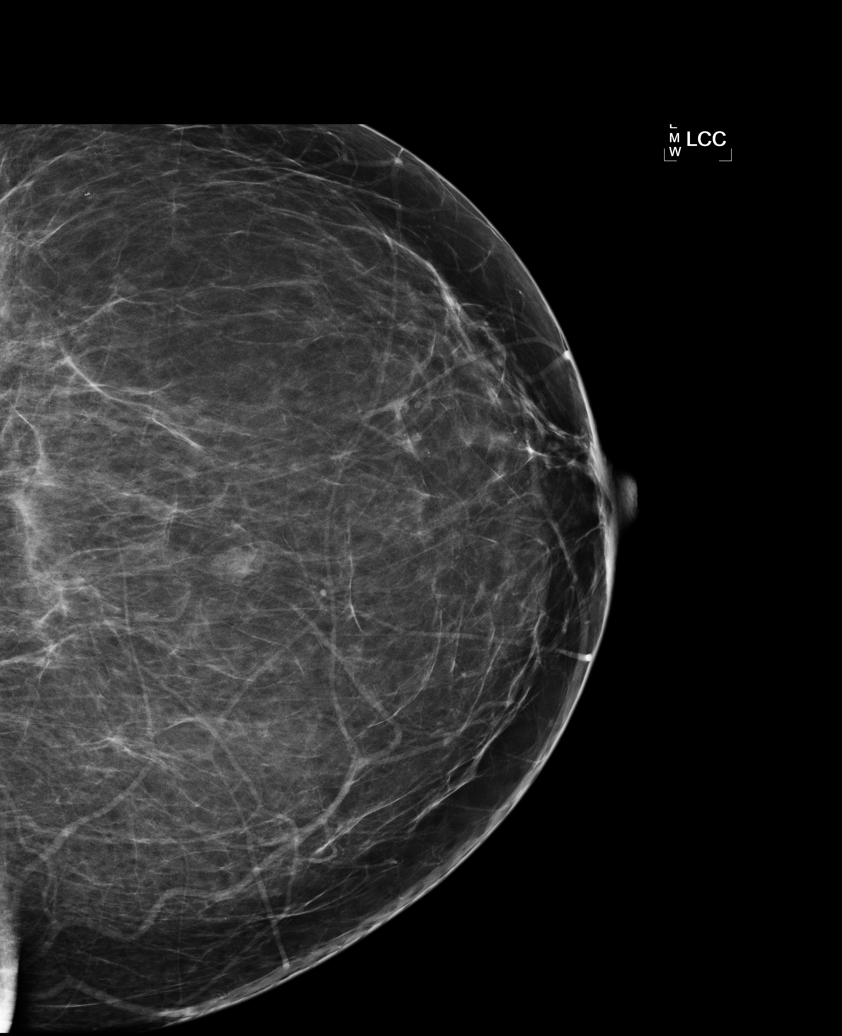

[5 of 5 positions shown; findings below may reference images not displayed]

FINDINGS: There are scattered fibroglandular densities. No
suspicious masses, architectural distortion, or calcifications are
present.

Images were processed with CAD.
IMPRESSION: No mammographic evidence of malignancy.

A result letter of this screening mammogram will be mailed directly
to the patient.

RECOMMENDATION:
Screening mammogram beginning at age 40. (Code:SX-N-CIO)

BI-RADS CATEGORY 1:  Negative.

## 2014-07-13 ENCOUNTER — Inpatient Hospital Stay (HOSPITAL_COMMUNITY)
Admission: AD | Admit: 2014-07-13 | Discharge: 2014-07-13 | Disposition: A | Discharge status: Home / Self Care | Payer: 59 | Source: Ambulatory Visit | Attending: Obstetrics and Gynecology | Admitting: Obstetrics and Gynecology

## 2014-07-13 ENCOUNTER — Encounter (HOSPITAL_COMMUNITY): Payer: Self-pay | Admitting: *Deleted

## 2014-07-13 DIAGNOSIS — A5901 Trichomonal vulvovaginitis: Secondary | ICD-10-CM | POA: Insufficient documentation

## 2014-07-13 DIAGNOSIS — A5903 Trichomonal cystitis and urethritis: Secondary | ICD-10-CM

## 2014-07-13 DIAGNOSIS — N898 Other specified noninflammatory disorders of vagina: Secondary | ICD-10-CM | POA: Diagnosis not present

## 2014-07-13 DIAGNOSIS — R300 Dysuria: Secondary | ICD-10-CM | POA: Insufficient documentation

## 2014-07-13 DIAGNOSIS — F17210 Nicotine dependence, cigarettes, uncomplicated: Secondary | ICD-10-CM | POA: Insufficient documentation

## 2014-07-13 HISTORY — DX: Cellulitis, unspecified: L03.90

## 2014-07-13 LAB — URINE MICROSCOPIC-ADD ON

## 2014-07-13 LAB — URINALYSIS, ROUTINE W REFLEX MICROSCOPIC
BILIRUBIN URINE: NEGATIVE
Glucose, UA: NEGATIVE mg/dL
Ketones, ur: NEGATIVE mg/dL
NITRITE: NEGATIVE
PH: 6 (ref 5.0–8.0)
PROTEIN: 30 mg/dL — AB
Specific Gravity, Urine: >1.03 — ABNORMAL HIGH (ref 1.005–1.030)
UROBILINOGEN UA: 0.2 mg/dL (ref 0.0–1.0)

## 2014-07-13 LAB — WET PREP, GENITAL: Yeast Wet Prep HPF POC: NONE SEEN

## 2014-07-13 LAB — POCT PREGNANCY, URINE: PREG TEST UR: NEGATIVE

## 2014-07-13 MED ORDER — METRONIDAZOLE 500 MG PO TABS: 500.0000 mg | ORAL_TABLET | Freq: Once | ORAL | Status: DC

## 2014-07-13 NOTE — Discharge Instructions (Signed)

## 2014-07-13 NOTE — MAU Note (Signed)
Vag discharge with odor, first noted on Sunday.  Pain with urination also started on Sun

## 2014-07-13 NOTE — MAU Provider Note (Signed)
History     CSN: [id number]  Arrival date and time: 07/13/14 1522   None     Chief Complaint  Patient presents with  . Vaginal Discharge  . Dysuria   HPI Diane [name] is 39 y.o. [id number] presents with vaginal discharge and dysuria that began 5 days ago.  She is having lower abdominal pressure not pain.  She is a patient of Dr. Christen Butter.  She has an appt for Wednesday but "couldn't wait".  1 sexual partner X 10 years.      Past Medical History  Diagnosis Date  . Hypertension   . Anxiety   . Cellulitis     Past Surgical History  Procedure Laterality Date  . Cesarean section  02-04-2010;  11-30-2006;  07-12-2001  . Laparoscopic tubal ligation Bilateral 03/02/2014    Procedure: BILATERAL LAPAROSCOPIC TUBAL LIGATION;  Surgeon: Cyril Mourning, MD;  Location: [facility];  Service: Gynecology;  Laterality: Bilateral;    Family History  Problem Relation Age of Onset  . Cancer Mother     throat cancer  . Hypertension Mother   . Heart disease Father 15  . Stroke Father   . Cancer Maternal Aunt     breast cancer  . Cancer Maternal Uncle     prostate  . Diabetes Maternal Grandmother   . Kidney disease Neg Hx   . Cancer Maternal Uncle     throat cancer  . Cancer Maternal Aunt     breast cancer--in remision    History  Substance Use Topics  . Smoking status: Current Every Day Smoker -- 0.25 packs/day for 10 years    Types: Cigarettes  . Smokeless tobacco: Never Used     Comment: pt trying to quit down to 3 -4 cig's daily from 1ppd  . Alcohol Use: Yes     Comment:  occasionally    Allergies: No Known Allergies  Prescriptions prior to admission  Medication Sig Dispense Refill Last Dose  . ALPRAZolam (XANAX) 0.5 MG tablet Take 0.5 mg by mouth 3 (three) times daily as needed for anxiety.   07/12/2014 at Unknown time  . hydrochlorothiazide (HYDRODIURIL) 25 MG tablet Take 25 mg by mouth every morning.   07/13/2014 at Unknown time  . lisinopril  (PRINIVIL,ZESTRIL) 40 MG tablet Take 40 mg by mouth daily.   07/13/2014 at Unknown time  . oxyCODONE-acetaminophen (PERCOCET) 10-325 MG per tablet Take 1 tablet by mouth every 4 (four) hours as needed for pain. (Patient not taking: Reported on 07/13/2014) 30 tablet 0 Not Taking at Unknown time    Review of Systems  Constitutional: Negative for fever and chills.  Gastrointestinal: Positive for abdominal pain (lower mid abdominal pressure). Negative for nausea and vomiting.  Genitourinary: Positive for dysuria. Negative for urgency, frequency and hematuria.       + vaginal discharge  Neurological: Negative for headaches.   Physical Exam   Blood pressure 148/90, pulse 99, temperature 98.2 F (36.8 C), temperature source Oral, resp. rate 18, height 4' 10.5" (1.486 m), weight 269 lb (122.018 kg), last menstrual period 07/05/2014.  Physical Exam  Constitutional: She is oriented to person, place, and time. She appears well-developed and well-nourished. No distress.  HENT:  Head: Normocephalic.  Neck: Normal range of motion.  Cardiovascular: Normal rate.   Respiratory: Effort normal.  GI: Soft. She exhibits no distension and no mass. There is tenderness (mid suprapubic tenderness on exam). There is no rebound and no guarding.  Genitourinary: There is no  rash, tenderness or lesion on the right labia. There is no rash, tenderness or lesion on the left labia. Uterus is not enlarged and not tender. Cervix exhibits discharge (discharge covers cervix but neg for discharge coming from cervix) and friability. Cervix exhibits no motion tenderness. Right adnexum displays no mass, no tenderness and no fullness. Left adnexum displays no mass, no tenderness and no fullness. There is erythema in the vagina. No tenderness or bleeding in the vagina. Vaginal discharge (large amount of yellow discharge) found.  Neurological: She is alert and oriented to person, place, and time.  Skin: Skin is warm and dry.   Psychiatric: She has a normal mood and affect. Her behavior is normal.     Results for orders placed or performed during the hospital encounter of 07/13/14 (from the past 24 hour(s))  Urinalysis, Routine w reflex microscopic (not at [facility])     Status: Abnormal   Collection Time: 07/13/14  4:00 PM  Result Value Ref Range   Color, Urine YELLOW YELLOW   APPearance CLOUDY (A) CLEAR   Specific Gravity, Urine >1.030 (H) 1.005 - 1.030   pH 6.0 5.0 - 8.0   Glucose, UA NEGATIVE NEGATIVE mg/dL   Hgb urine dipstick SMALL (A) NEGATIVE   Bilirubin Urine NEGATIVE NEGATIVE   Ketones, ur NEGATIVE NEGATIVE mg/dL   Protein, ur 30 (A) NEGATIVE mg/dL   Urobilinogen, UA 0.2 0.0 - 1.0 mg/dL   Nitrite NEGATIVE NEGATIVE   Leukocytes, UA MODERATE (A) NEGATIVE  Urine microscopic-add on     Status: Abnormal   Collection Time: 07/13/14  4:00 PM  Result Value Ref Range   Squamous Epithelial / LPF FEW (A) RARE   WBC, UA 3-6 <3 WBC/hpf   RBC / HPF 0-2 <3 RBC/hpf   Bacteria, UA RARE RARE   Urine-Other TRICHOMONAS PRESENT   Pregnancy, urine POC     Status: None   Collection Time: 07/13/14  4:07 PM  Result Value Ref Range   Preg Test, Ur NEGATIVE NEGATIVE  Wet prep, genital     Status: Abnormal   Collection Time: 07/13/14  5:20 PM  Result Value Ref Range   Yeast Wet Prep HPF POC NONE SEEN NONE SEEN   Trich, Wet Prep MODERATE (A) NONE SEEN   Clue Cells Wet Prep HPF POC RARE (A) NONE SEEN   WBC, Wet Prep HPF POC MANY (A) NONE SEEN   MAU Course  Procedures  MDM Exam Labs GC/CHL pending Discussed labs with patient and stressed importance of being treated and making sure her partner is treated before being sexually active again.  Pelvic Rest X 1 week   Patient is in a hurry to leave b/c it's her son's birthday.  Could not wait for HIV testing to be drawn.   WIll have STD screening labs drawn at Dr. Christen Butter at Wednesday's appt, that she plans to keep Assessment and Plan  A:  Dysuria      Vaginal  Discharge     Trichomonal vaginitis     GC/CHL screening  P Flagyl 2 grams po sent to pharmacy-patient instructed to eat first and pelvic rest X 1 week     Patient plans to keep apt with Dr. Helane Rima for next week for further screening.  Diane [name],EVE M 07/13/2014, 5:51 PM

## 2014-07-17 LAB — GC/CHLAMYDIA PROBE AMP (~~LOC~~) NOT AT ARMC
CHLAMYDIA, DNA PROBE: NEGATIVE
NEISSERIA GONORRHEA: NEGATIVE

## 2015-04-09 ENCOUNTER — Ambulatory Visit: Payer: BLUE CROSS/BLUE SHIELD | Admitting: Dietician

## 2015-05-20 ENCOUNTER — Ambulatory Visit: Payer: BLUE CROSS/BLUE SHIELD | Admitting: Dietician

## 2015-07-08 ENCOUNTER — Encounter: Payer: Self-pay | Admitting: *Deleted

## 2015-07-08 ENCOUNTER — Encounter: Payer: 59 | Attending: Obstetrics and Gynecology | Admitting: *Deleted

## 2015-07-08 DIAGNOSIS — R7303 Prediabetes: Secondary | ICD-10-CM

## 2015-07-08 DIAGNOSIS — E785 Hyperlipidemia, unspecified: Secondary | ICD-10-CM

## 2015-07-08 DIAGNOSIS — Z029 Encounter for administrative examinations, unspecified: Secondary | ICD-10-CM | POA: Diagnosis present

## 2015-07-08 NOTE — Progress Notes (Signed)
  Medical Nutrition Therapy:  Appt start time: 1400 end time:  1500.   Assessment:  Primary concerns today: Diane [name], and son Diane [name], is here today to discuss her diagnosis of prediabetes while she was in the hospital last year. She also wants to discuss providing healthier meals for her family. Recent HgbA1c is not available. Patient states she has tried to lose weight in the past by overall healthier eating and physical activity. Patient states she typically skips meals and doesn't exercise as much as she should which is keeping her from losing weight. Patient states she grocery shops and prepares meals at home. Pateiet works night shift. On days she's not working, she eats in the living room with her teenagers, and the younger siblings eat at table. Patient states she struggles to make time for herself to eat 3 balanced meals and exercise because she is the primary caregiver for her children.     Preferred Learning Style:   Auditory  Visual  Learning Readiness:   Ready   MEDICATIONS: see list   DIETARY INTAKE:  Usual eating pattern includes 1 meals and 2 snacks per day.  Everyday foods include none stated.  Avoided foods include fish.    24-hr recall:  B ( AM): none  Snk ( AM):  L ( PM): none  Snk ( PM): none or chips D ( PM): baked or fried chicken, green beans Snk ( PM):  Beverages: water, pepsi, simply lemonade  Usual physical activity: none  Progress Towards Goal(s):  In progress.   Nutritional Diagnosis:  NB-1.1 Food and nutrition-related knowledge deficit As related to appropriate balanced of protein, carbohydrate.  As evidenced by pt report, 24 hr diet recall.    Intervention:  Nutrition counseling provided. Discussed MyPlate recommendations, healthy snack options, importance of physical activity, and importance of eating without distraction.  Teaching Method Utilized:  Visual Auditory  Handouts given during visit include:  Myplate  Snack list  10 ways to  be a good role model  Healthy snacks for children  Barriers to learning/adherence to lifestyle change: none  Demonstrated degree of understanding via:  Teach Back   Monitoring/Evaluation:  Dietary intake, exercise, and body weight prn.

## 2016-08-17 IMAGING — US US RENAL
1 series · 14 of 25 positions shown · non-contrast
Comparison: None

CLINICAL DATA: Acute renal failure, history smoking

EXAM:
RENAL/URINARY TRACT ULTRASOUND COMPLETE

[Series 1: us renal · 0.26mm/px · 36 acquisitions, 14 frames shown]
[im 1/36]
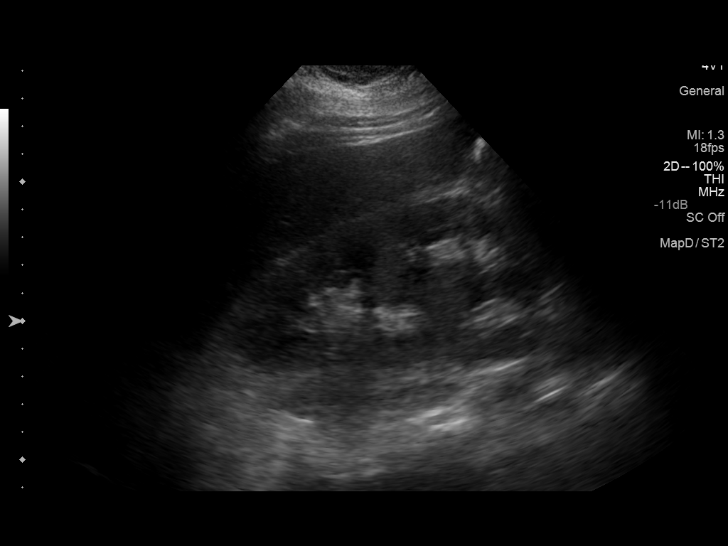
[im 3/36]
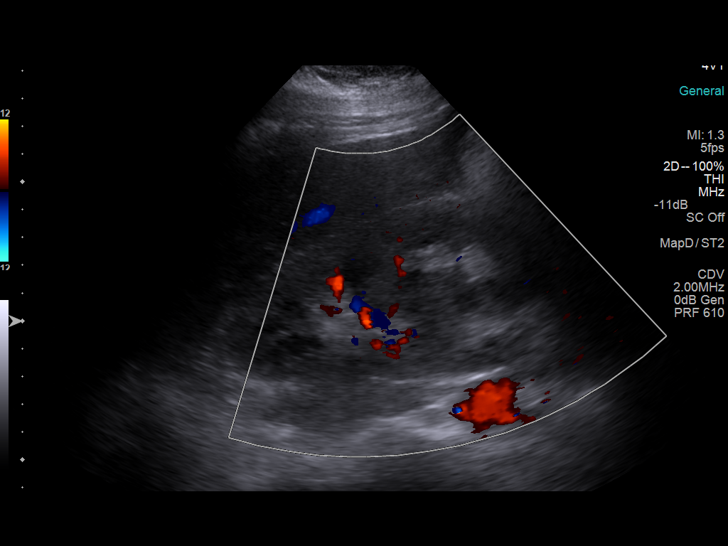
[im 6/36]
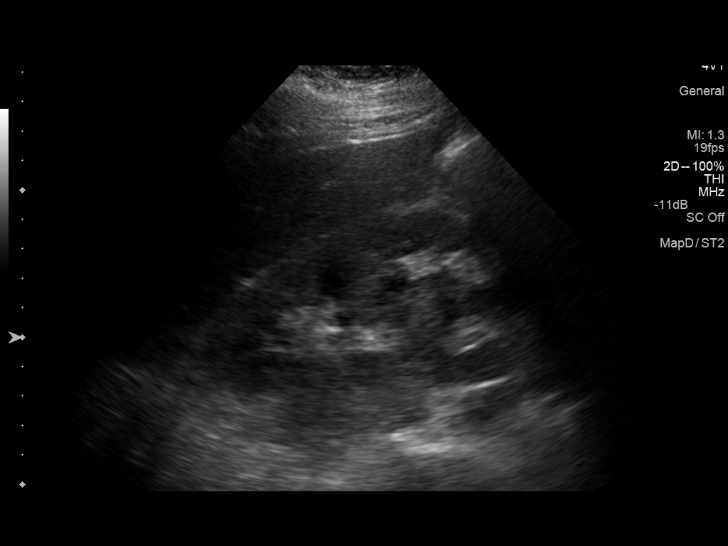
[im 9/36]
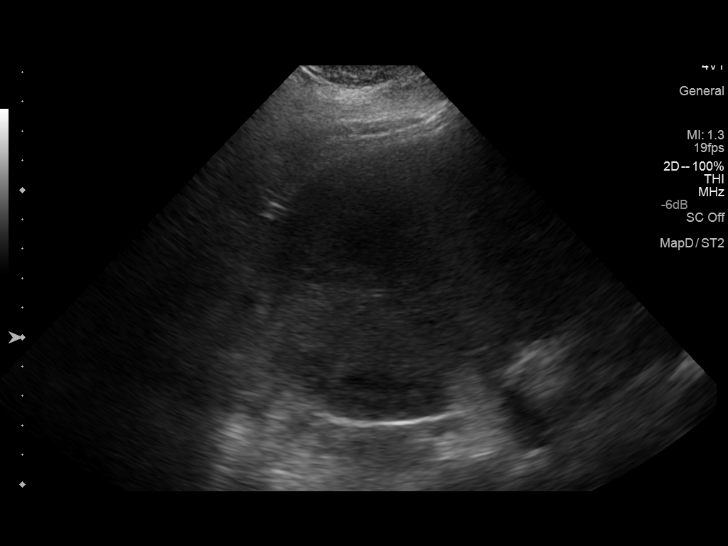
[im 12/36]
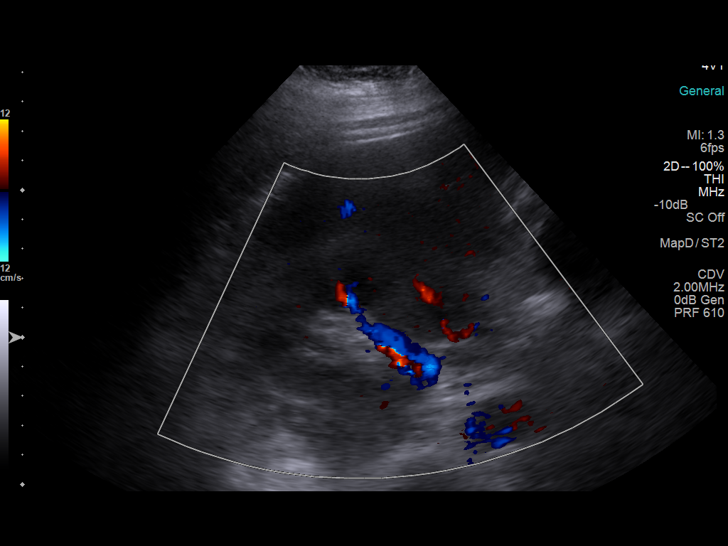
[im 14/36]
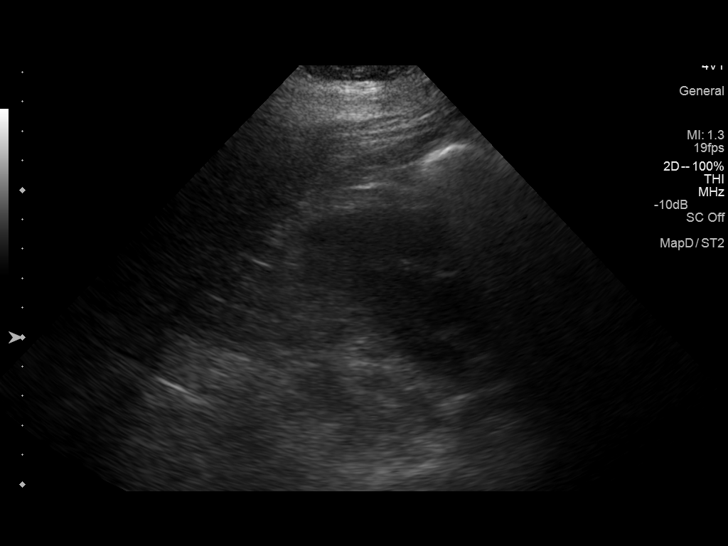
[im 17/36]
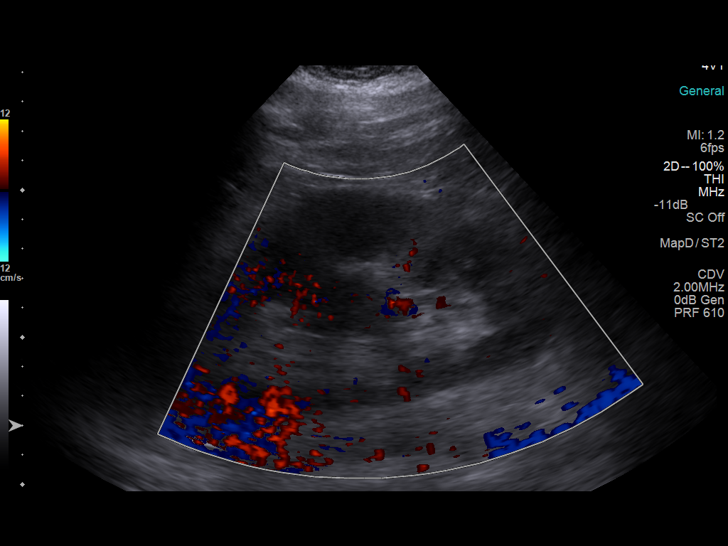
[im 19/36]
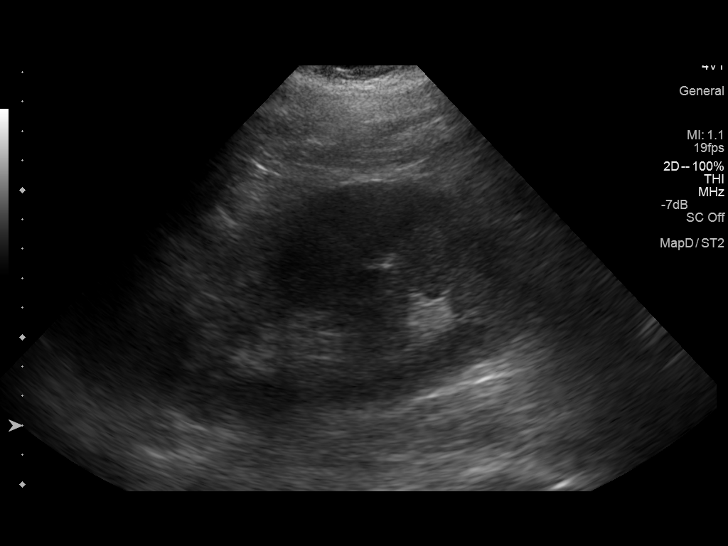
[im 22/36]
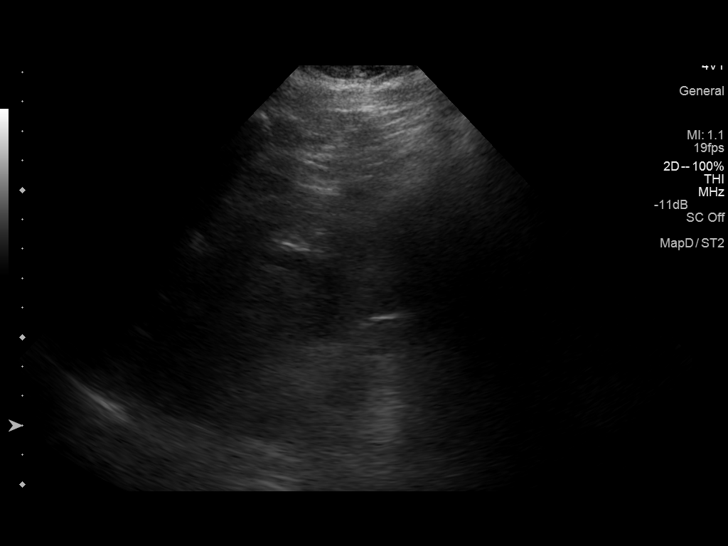
[im 24/36]
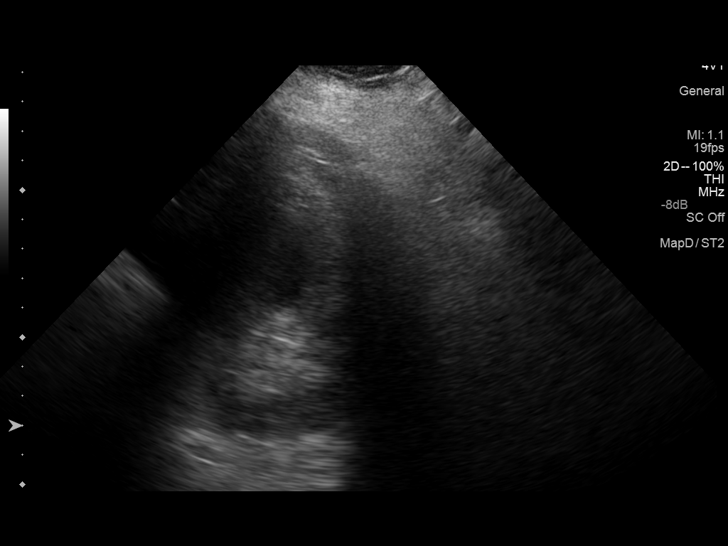
[im 27/36]
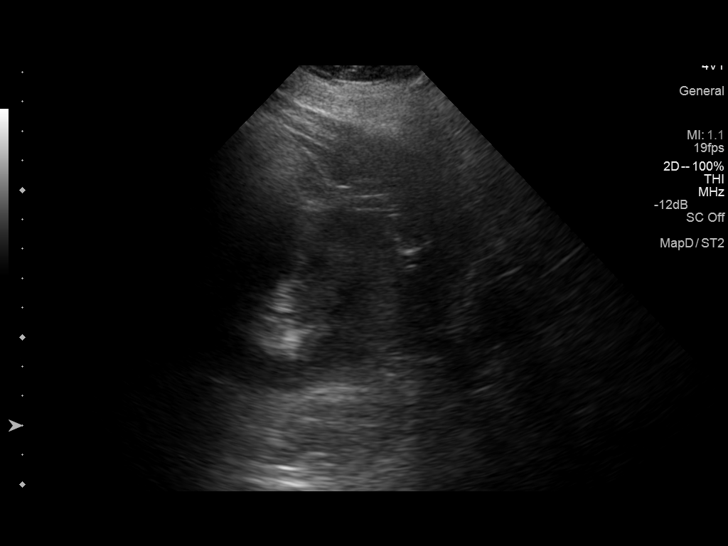
[im 30/36]
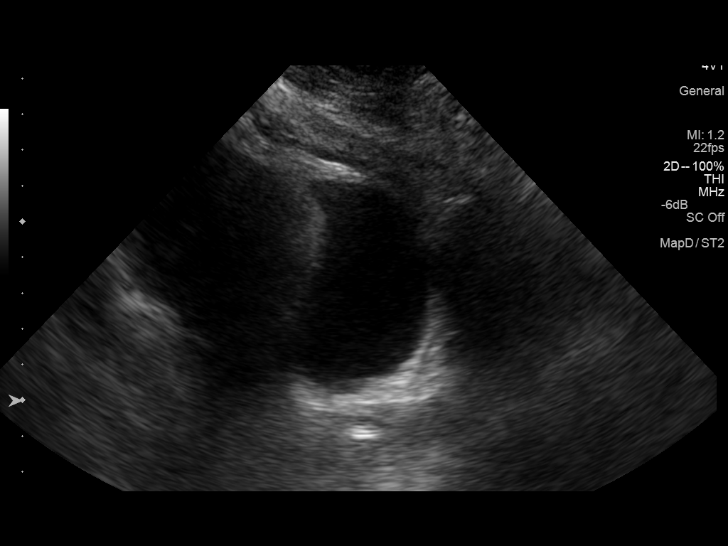
[im 33/36]
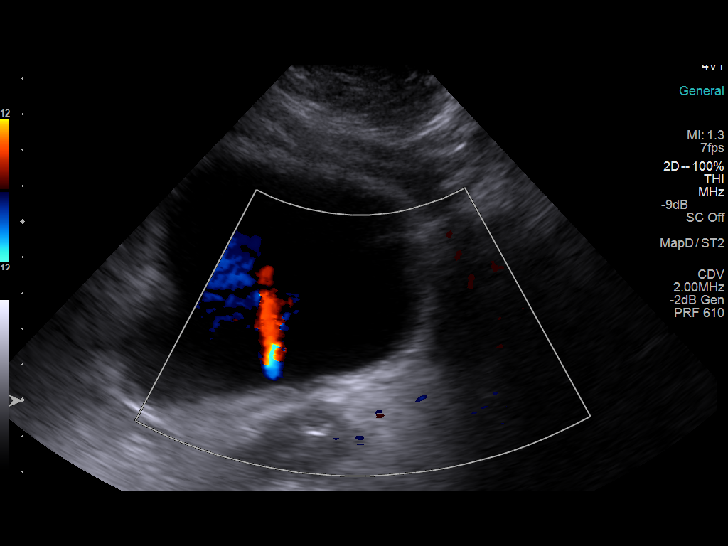
[im 36/36]
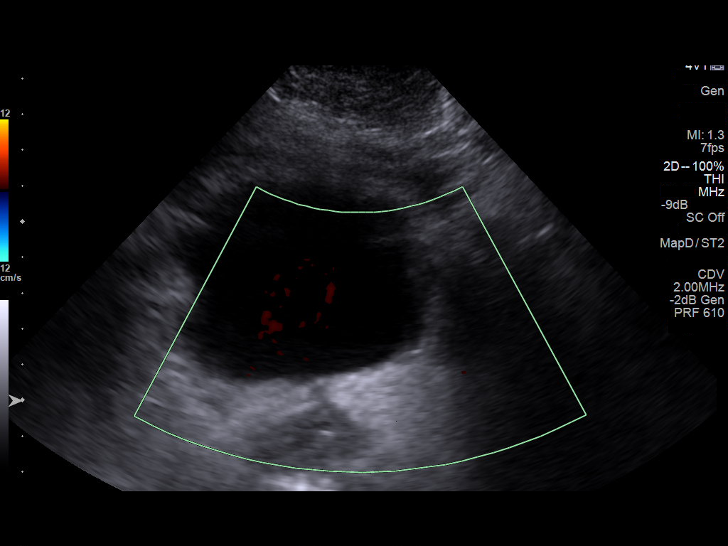

[14 of 25 positions shown; findings below may reference images not displayed]

FINDINGS: Right Kidney:

Length: 14.4 cm. Normal cortical thickness. Increased cortical
echogenicity. No mass, hydronephrosis or shadowing calcification.

Left Kidney:

Length: 14.0 cm. Normal cortical thickness. Increased cortical
echogenicity. No mass, hydronephrosis or shadowing calcification.

Bladder:

Normally distended without mass or wall thickening. BILATERAL
ureteral jets noted.
IMPRESSION: Medical renal disease changes of both kidneys, which appear slightly
enlarged.

No evidence of renal mass or hydronephrosis.

## 2016-09-11 NOTE — H&P (Signed)
41 year old G 6 P 3 who has had a BTL presents for treatment of menorrhagia. Ultrasound - normal endometrium with small intramural fibroids.  Past Medical History:  Diagnosis Date  . Anxiety   . Cellulitis   . Hypertension    Past Surgical History:  Procedure Laterality Date  . CESAREAN SECTION  02-04-2010;  11-30-2006;  07-12-2001  . LAPAROSCOPIC TUBAL LIGATION Bilateral 03/02/2014   Procedure: BILATERAL LAPAROSCOPIC TUBAL LIGATION;  Surgeon: Cyril Mourning, MD;  Location: [facility];  Service: Gynecology;  Laterality: Bilateral;   Prior to Admission medications   Medication Sig Start Date End Date Taking? Authorizing Provider  ALPRAZolam Duanne Moron) 0.5 MG tablet Take 0.5 mg by mouth 3 (three) times daily as needed for anxiety.    [provider]  hydrochlorothiazide (HYDRODIURIL) 25 MG tablet Take 25 mg by mouth every morning.    [provider]  lisinopril (PRINIVIL,ZESTRIL) 40 MG tablet Take 40 mg by mouth daily.    [provider]  metroNIDAZOLE (FLAGYL) 500 MG tablet Take 1 tablet (500 mg total) by mouth once. Patient not taking: Reported on 07/08/2015 07/13/14   Key, Nelia Shi, NP   NKDA  Family History  Problem Relation Age of Onset  . Cancer Mother        throat cancer  . Hypertension Mother   . Heart disease Father 72  . Stroke Father   . Cancer Maternal Aunt        breast cancer  . Cancer Maternal Uncle        prostate  . Diabetes Maternal Grandmother   . Kidney disease Neg Hx   . Cancer Maternal Uncle        throat cancer  . Cancer Maternal Aunt        breast cancer--in remision   Social History   Social History  . Marital status: Single    Spouse name: N/A  . Number of children: 4  . Years of education: N/A   Occupational History  .  Unemployed   Social History Main Topics  . Smoking status: Current Every Day Smoker    Packs/day: 0.25    Years: 10.00    Types: Cigarettes  . Smokeless tobacco: Never Used   Comment: pt trying to quit down to 3 -4 cig's daily from 1ppd  . Alcohol use Yes     Comment:  occasionally  . Drug use: No  . Sexual activity: Yes    Birth control/ protection: Surgical   Other Topics Concern  . Not on file   Social History Narrative   [employer]- works from home.     3 children- (52 year old adopted child) 70, 4 and 1.    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

## 2016-09-22 NOTE — Patient Instructions (Signed)
Your procedure is scheduled on:  Thursday, Sept. 20, 2018  Enter through the [employer] [facility] at:  [place] up the phone at the desk and dial [phone].  Call this number if you have problems the morning of surgery: [phone].  Remember: Do NOT eat food:  After Midnight Wednesday  Do NOT drink clear liquids after:  7:30 AM day of surgery  Take these medicines the morning of surgery with a SIP OF WATER:  Xanax if needed  Stop ALL herbal medications at this time  Do NOT smoke the day of surgery.  Do NOT wear jewelry (body piercing), metal hair clips/bobby pins, make-up, artifical eyelashes or nail polish. Do NOT wear lotions, powders, or perfumes.  You may wear deodorant. Do NOT shave for 48 hours prior to surgery. Do NOT bring valuables to the hospital. Contacts, dentures, or bridgework may not be worn into surgery.  Have a responsible adult drive you home and stay with you for 24 hours after your procedure  Bring a copy of your healthcare power of attorney and living will documents.

## 2016-09-23 ENCOUNTER — Encounter (HOSPITAL_COMMUNITY)
Admission: RE | Admit: 2016-09-23 | Discharge: 2016-09-23 | Disposition: A | Discharge status: Home / Self Care | Payer: 59 | Source: Ambulatory Visit | Attending: Obstetrics and Gynecology | Admitting: Obstetrics and Gynecology

## 2016-09-23 ENCOUNTER — Encounter (HOSPITAL_COMMUNITY): Payer: Self-pay

## 2016-09-23 ENCOUNTER — Other Ambulatory Visit: Payer: Self-pay

## 2016-09-23 DIAGNOSIS — F419 Anxiety disorder, unspecified: Secondary | ICD-10-CM | POA: Insufficient documentation

## 2016-09-23 DIAGNOSIS — Z792 Long term (current) use of antibiotics: Secondary | ICD-10-CM | POA: Insufficient documentation

## 2016-09-23 DIAGNOSIS — Z01812 Encounter for preprocedural laboratory examination: Secondary | ICD-10-CM | POA: Insufficient documentation

## 2016-09-23 DIAGNOSIS — Z79899 Other long term (current) drug therapy: Secondary | ICD-10-CM | POA: Insufficient documentation

## 2016-09-23 DIAGNOSIS — L0390 Cellulitis, unspecified: Secondary | ICD-10-CM | POA: Diagnosis not present

## 2016-09-23 DIAGNOSIS — F17210 Nicotine dependence, cigarettes, uncomplicated: Secondary | ICD-10-CM | POA: Diagnosis not present

## 2016-09-23 DIAGNOSIS — Z803 Family history of malignant neoplasm of breast: Secondary | ICD-10-CM | POA: Diagnosis not present

## 2016-09-23 DIAGNOSIS — Z9889 Other specified postprocedural states: Secondary | ICD-10-CM | POA: Diagnosis not present

## 2016-09-23 DIAGNOSIS — Z8042 Family history of malignant neoplasm of prostate: Secondary | ICD-10-CM | POA: Diagnosis not present

## 2016-09-23 DIAGNOSIS — Z800 Family history of malignant neoplasm of digestive organs: Secondary | ICD-10-CM | POA: Insufficient documentation

## 2016-09-23 DIAGNOSIS — Z8249 Family history of ischemic heart disease and other diseases of the circulatory system: Secondary | ICD-10-CM | POA: Insufficient documentation

## 2016-09-23 DIAGNOSIS — I10 Essential (primary) hypertension: Secondary | ICD-10-CM | POA: Diagnosis not present

## 2016-09-23 DIAGNOSIS — N920 Excessive and frequent menstruation with regular cycle: Secondary | ICD-10-CM | POA: Insufficient documentation

## 2016-09-23 DIAGNOSIS — Z833 Family history of diabetes mellitus: Secondary | ICD-10-CM | POA: Diagnosis not present

## 2016-09-23 DIAGNOSIS — D251 Intramural leiomyoma of uterus: Secondary | ICD-10-CM | POA: Insufficient documentation

## 2016-09-23 DIAGNOSIS — Z823 Family history of stroke: Secondary | ICD-10-CM | POA: Diagnosis not present

## 2016-09-23 DIAGNOSIS — Z01810 Encounter for preprocedural cardiovascular examination: Secondary | ICD-10-CM | POA: Insufficient documentation

## 2016-09-23 LAB — CBC
HCT: 38.6 % (ref 36.0–46.0)
Hemoglobin: 12.9 g/dL (ref 12.0–15.0)
MCH: 29.3 pg (ref 26.0–34.0)
MCHC: 33.4 g/dL (ref 30.0–36.0)
MCV: 87.7 fL (ref 78.0–100.0)
PLATELETS: 340 10*3/uL (ref 150–400)
RBC: 4.4 MIL/uL (ref 3.87–5.11)
RDW: 14 % (ref 11.5–15.5)
WBC: 6.8 10*3/uL (ref 4.0–10.5)

## 2016-09-23 LAB — BASIC METABOLIC PANEL
Anion gap: 9 (ref 5–15)
BUN: 11 mg/dL (ref 6–20)
CALCIUM: 9.2 mg/dL (ref 8.9–10.3)
CO2: 29 mmol/L (ref 22–32)
CREATININE: 0.79 mg/dL (ref 0.44–1.00)
Chloride: 100 mmol/L — ABNORMAL LOW (ref 101–111)
GFR calc Af Amer: >60 mL/min (ref >60)
GFR calc non Af Amer: >60 mL/min (ref >60)
GLUCOSE: 121 mg/dL — AB (ref 65–99)
POTASSIUM: 4 mmol/L (ref 3.5–5.1)
SODIUM: 138 mmol/L (ref 135–145)

## 2016-10-01 ENCOUNTER — Ambulatory Visit (HOSPITAL_COMMUNITY)
Admission: AD | Admit: 2016-10-01 | Discharge: 2016-10-01 | Disposition: A | Discharge status: Home / Self Care | Payer: 59 | Source: Ambulatory Visit | Attending: Obstetrics and Gynecology | Admitting: Obstetrics and Gynecology

## 2016-10-01 ENCOUNTER — Ambulatory Visit (HOSPITAL_COMMUNITY): Payer: 59 | Admitting: Anesthesiology

## 2016-10-01 ENCOUNTER — Encounter (HOSPITAL_COMMUNITY)
Admission: AD | Disposition: A | Discharge status: Home / Self Care | Payer: Self-pay | Source: Ambulatory Visit | Attending: Obstetrics and Gynecology

## 2016-10-01 ENCOUNTER — Encounter (HOSPITAL_COMMUNITY): Payer: Self-pay | Admitting: Emergency Medicine

## 2016-10-01 DIAGNOSIS — Z833 Family history of diabetes mellitus: Secondary | ICD-10-CM | POA: Diagnosis not present

## 2016-10-01 DIAGNOSIS — Z8042 Family history of malignant neoplasm of prostate: Secondary | ICD-10-CM | POA: Diagnosis not present

## 2016-10-01 DIAGNOSIS — F17210 Nicotine dependence, cigarettes, uncomplicated: Secondary | ICD-10-CM | POA: Insufficient documentation

## 2016-10-01 DIAGNOSIS — Z800 Family history of malignant neoplasm of digestive organs: Secondary | ICD-10-CM | POA: Diagnosis not present

## 2016-10-01 DIAGNOSIS — F419 Anxiety disorder, unspecified: Secondary | ICD-10-CM | POA: Diagnosis not present

## 2016-10-01 DIAGNOSIS — Z79899 Other long term (current) drug therapy: Secondary | ICD-10-CM | POA: Diagnosis not present

## 2016-10-01 DIAGNOSIS — R51 Headache: Secondary | ICD-10-CM | POA: Diagnosis not present

## 2016-10-01 DIAGNOSIS — Z8249 Family history of ischemic heart disease and other diseases of the circulatory system: Secondary | ICD-10-CM | POA: Diagnosis not present

## 2016-10-01 DIAGNOSIS — Z803 Family history of malignant neoplasm of breast: Secondary | ICD-10-CM | POA: Insufficient documentation

## 2016-10-01 DIAGNOSIS — N920 Excessive and frequent menstruation with regular cycle: Secondary | ICD-10-CM | POA: Insufficient documentation

## 2016-10-01 DIAGNOSIS — I10 Essential (primary) hypertension: Secondary | ICD-10-CM | POA: Diagnosis not present

## 2016-10-01 HISTORY — PX: DILITATION & CURRETTAGE/HYSTROSCOPY WITH HYDROTHERMAL ABLATION: SHX5570

## 2016-10-01 LAB — PREGNANCY, URINE: PREG TEST UR: NEGATIVE

## 2016-10-01 SURGERY — DILATATION & CURETTAGE/HYSTEROSCOPY WITH HYDROTHERMAL ABLATION
Anesthesia: General | Site: Vagina

## 2016-10-01 MED ORDER — FENTANYL CITRATE (PF) 100 MCG/2ML IJ SOLN
25.0000 ug | INTRAMUSCULAR | Status: DC | PRN
  Administered 2016-10-01 (×2): 50 ug via INTRAVENOUS

## 2016-10-01 MED ORDER — MIDAZOLAM HCL 2 MG/2ML IJ SOLN
INTRAMUSCULAR | Status: AC
  Filled 2016-10-01: qty 2

## 2016-10-01 MED ORDER — PROPOFOL 10 MG/ML IV BOLUS
INTRAVENOUS | Status: AC
  Filled 2016-10-01: qty 40

## 2016-10-01 MED ORDER — SCOPOLAMINE 1 MG/3DAYS TD PT72
1.0000 | MEDICATED_PATCH | Freq: Once | TRANSDERMAL | Status: DC
  Administered 2016-10-01: 1.5 mg via TRANSDERMAL

## 2016-10-01 MED ORDER — LIDOCAINE HCL (CARDIAC) 20 MG/ML IV SOLN
INTRAVENOUS | Status: AC
  Filled 2016-10-01: qty 5

## 2016-10-01 MED ORDER — KETOROLAC TROMETHAMINE 30 MG/ML IJ SOLN
INTRAMUSCULAR | Status: AC
  Filled 2016-10-01: qty 1

## 2016-10-01 MED ORDER — METOCLOPRAMIDE HCL 5 MG/ML IJ SOLN
INTRAMUSCULAR | Status: AC
  Filled 2016-10-01: qty 2

## 2016-10-01 MED ORDER — FENTANYL CITRATE (PF) 250 MCG/5ML IJ SOLN
INTRAMUSCULAR | Status: AC
Start: 2016-10-01 — End: 2016-10-01
  Filled 2016-10-01: qty 5

## 2016-10-01 MED ORDER — FENTANYL CITRATE (PF) 100 MCG/2ML IJ SOLN
INTRAMUSCULAR | Status: AC
  Administered 2016-10-01: 50 ug via INTRAVENOUS
  Filled 2016-10-01: qty 2

## 2016-10-01 MED ORDER — LACTATED RINGERS IV SOLN
INTRAVENOUS | Status: DC
  Administered 2016-10-01 (×2): via INTRAVENOUS

## 2016-10-01 MED ORDER — LIDOCAINE HCL (CARDIAC) 20 MG/ML IV SOLN
INTRAVENOUS | Status: DC | PRN
  Administered 2016-10-01: 80 mg via INTRAVENOUS

## 2016-10-01 MED ORDER — DEXAMETHASONE SODIUM PHOSPHATE 10 MG/ML IJ SOLN
INTRAMUSCULAR | Status: AC
  Filled 2016-10-01: qty 1

## 2016-10-01 MED ORDER — LIDOCAINE HCL 1 % IJ SOLN
INTRAMUSCULAR | Status: AC
  Filled 2016-10-01: qty 20

## 2016-10-01 MED ORDER — DEXAMETHASONE SODIUM PHOSPHATE 10 MG/ML IJ SOLN
INTRAMUSCULAR | Status: DC | PRN
  Administered 2016-10-01: 8 mg via INTRAVENOUS

## 2016-10-01 MED ORDER — PROPOFOL 10 MG/ML IV BOLUS
INTRAVENOUS | Status: DC | PRN
  Administered 2016-10-01: 250 mg via INTRAVENOUS

## 2016-10-01 MED ORDER — MEPERIDINE HCL 25 MG/ML IJ SOLN
INTRAMUSCULAR | Status: AC
  Administered 2016-10-01: 12.5 mg via INTRAVENOUS
  Filled 2016-10-01: qty 1

## 2016-10-01 MED ORDER — ONDANSETRON HCL 4 MG/2ML IJ SOLN
INTRAMUSCULAR | Status: AC
  Filled 2016-10-01: qty 2

## 2016-10-01 MED ORDER — OXYCODONE-ACETAMINOPHEN 5-325 MG PO TABS
ORAL_TABLET | ORAL | Status: AC
  Filled 2016-10-01: qty 1

## 2016-10-01 MED ORDER — OXYCODONE-ACETAMINOPHEN 5-325 MG PO TABS
1.0000 | ORAL_TABLET | Freq: Once | ORAL | Status: AC
  Administered 2016-10-01: 1 via ORAL

## 2016-10-01 MED ORDER — ONDANSETRON HCL 4 MG/2ML IJ SOLN: 4.0000 mg | Freq: Once | INTRAMUSCULAR | Status: DC | PRN

## 2016-10-01 MED ORDER — FENTANYL CITRATE (PF) 100 MCG/2ML IJ SOLN
INTRAMUSCULAR | Status: DC | PRN
  Administered 2016-10-01 (×3): 50 ug via INTRAVENOUS

## 2016-10-01 MED ORDER — SCOPOLAMINE 1 MG/3DAYS TD PT72
MEDICATED_PATCH | TRANSDERMAL | Status: AC
  Administered 2016-10-01: 1.5 mg via TRANSDERMAL
  Filled 2016-10-01: qty 1

## 2016-10-01 MED ORDER — METOCLOPRAMIDE HCL 5 MG/ML IJ SOLN
INTRAMUSCULAR | Status: DC | PRN
  Administered 2016-10-01: 5 mg via INTRAVENOUS

## 2016-10-01 MED ORDER — SODIUM CHLORIDE 0.9 % IR SOLN
Status: DC | PRN
Start: 2016-10-01 — End: 2016-10-01
  Administered 2016-10-01: 3000 mL

## 2016-10-01 MED ORDER — DEXTROSE 5 % IV SOLN
3.0000 g | INTRAVENOUS | Status: AC
  Administered 2016-10-01: 3 g via INTRAVENOUS
  Filled 2016-10-01: qty 3000

## 2016-10-01 MED ORDER — KETOROLAC TROMETHAMINE 30 MG/ML IJ SOLN
INTRAMUSCULAR | Status: DC | PRN
  Administered 2016-10-01: 30 mg via INTRAVENOUS

## 2016-10-01 MED ORDER — GLYCOPYRROLATE 0.2 MG/ML IJ SOLN
INTRAMUSCULAR | Status: DC | PRN
  Administered 2016-10-01: 0.1 mg via INTRAVENOUS

## 2016-10-01 MED ORDER — MIDAZOLAM HCL 2 MG/2ML IJ SOLN
INTRAMUSCULAR | Status: DC | PRN
  Administered 2016-10-01: 1 mg via INTRAVENOUS

## 2016-10-01 MED ORDER — LIDOCAINE HCL 1 % IJ SOLN
INTRAMUSCULAR | Status: DC | PRN
  Administered 2016-10-01: 10 mL

## 2016-10-01 MED ORDER — MEPERIDINE HCL 25 MG/ML IJ SOLN
6.2500 mg | INTRAMUSCULAR | Status: DC | PRN
  Administered 2016-10-01: 12.5 mg via INTRAVENOUS

## 2016-10-01 SURGICAL SUPPLY — 14 items
CANISTER SUCT 3000ML PPV (MISCELLANEOUS) ×2 IMPLANT
CATH ROBINSON RED A/P 16FR (CATHETERS) ×2 IMPLANT
CLOTH BEACON ORANGE TIMEOUT ST (SAFETY) ×2 IMPLANT
CONTAINER PREFILL 10% NBF 60ML (FORM) ×4 IMPLANT
ELECT REM PT RETURN 9FT ADLT (ELECTROSURGICAL) ×2
ELECTRODE REM PT RTRN 9FT ADLT (ELECTROSURGICAL) ×1 IMPLANT
GLOVE BIO SURGEON STRL SZ 6.5 (GLOVE) ×2 IMPLANT
GLOVE BIOGEL PI IND STRL 7.0 (GLOVE) ×1 IMPLANT
GLOVE BIOGEL PI INDICATOR 7.0 (GLOVE) ×1
GOWN STRL REUS W/TWL LRG LVL3 (GOWN DISPOSABLE) ×4 IMPLANT
PACK VAGINAL MINOR WOMEN LF (CUSTOM PROCEDURE TRAY) ×2 IMPLANT
PAD OB MATERNITY 4.3X12.25 (PERSONAL CARE ITEMS) ×2 IMPLANT
SET GENESYS HTA PROCERVA (MISCELLANEOUS) ×2 IMPLANT
TOWEL OR 17X24 6PK STRL BLUE (TOWEL DISPOSABLE) ×4 IMPLANT

## 2016-10-01 NOTE — Anesthesia Procedure Notes (Signed)
Procedure Name: LMA Insertion Date/Time: 10/01/2016 1:44 PM Performed by: Georgeanne Nim Pre-anesthesia Checklist: Patient identified, Emergency Drugs available, Suction available, Patient being monitored and Timeout performed Patient Re-evaluated:Patient Re-evaluated prior to induction Oxygen Delivery Method: Circle system utilized Preoxygenation: Pre-oxygenation with 100% oxygen Induction Type: IV induction Ventilation: Mask ventilation without difficulty LMA: LMA with gastric port inserted LMA Size: 3.0 Number of attempts: 1 Airway Equipment and Method: Patient positioned with wedge pillow Placement Confirmation: positive ETCO2,  CO2 detector and breath sounds checked- equal and bilateral Tube secured with: Tape Dental Injury: Teeth and Oropharynx as per pre-operative assessment

## 2016-10-01 NOTE — Transfer of Care (Signed)
Immediate Anesthesia Transfer of Care Note  Patient: Diane [name]  Procedure(s) Performed: Procedure(s): DILATATION & CURETTAGE/HYSTEROSCOPY WITH HYDROTHERMAL ABLATION (N/A)  Patient Location: PACU  Anesthesia Type:General  Level of Consciousness: awake and patient cooperative  Airway & Oxygen Therapy: Patient Spontanous Breathing and Patient connected to nasal cannula oxygen  Post-op Assessment: Report given to RN and Post -op Vital signs reviewed and stable  Post vital signs: Reviewed and stable  Last Vitals:  Vitals:   10/01/16 1203  BP: 119/76  Pulse: 90  Resp: 16  Temp: 36.9 C  SpO2: 100%    Last Pain:  Vitals:   10/01/16 1203  TempSrc: Oral      Patients Stated Pain Goal: 4 (23/41/44 3601)  Complications: No apparent anesthesia complications

## 2016-10-01 NOTE — Brief Op Note (Signed)
10/01/2016  2:14 PM  PATIENT:  Diane [name]  41 y.o. female  PRE-OPERATIVE DIAGNOSIS:  MENORRHAGIA  POST-OPERATIVE DIAGNOSIS:  MENORRHAGIA  PROCEDURE:  Procedure(s): DILATATION & CURETTAGE/HYSTEROSCOPY WITH HYDROTHERMAL ABLATION (N/A)  SURGEON:  Surgeon(s) and Role:    * Dian Queen, MD - Primary  PHYSICIAN ASSISTANT:   ASSISTANTS: none   ANESTHESIA:   paracervical block and MAC  EBL:  Total I/O In: 200 [I.V.:200] Out: 60 [Urine:50; Blood:10]  BLOOD ADMINISTERED:none  DRAINS: none   LOCAL MEDICATIONS USED:  NONE  SPECIMEN:  Source of Specimen:  endometrial curettings  DISPOSITION OF SPECIMEN:  Source of Specimen:  endometrial curettings  COUNTS:  YES  TOURNIQUET:  * No tourniquets in log *  DICTATION: .Other Dictation: Dictation Number [id number]  PLAN OF CARE: Other Dictation: Dictation Number [id number]  PATIENT DISPOSITION:  PACU - hemodynamically stable.   Delay start of Pharmacological VTE agent (>24hrs) due to surgical blood loss or risk of bleeding: not applicable

## 2016-10-01 NOTE — Discharge Instructions (Signed)

## 2016-10-01 NOTE — Anesthesia Postprocedure Evaluation (Signed)
Anesthesia Post Note  Patient: Dorraine Ellender Vacha  Procedure(s) Performed: Procedure(s) (LRB): DILATATION & CURETTAGE/HYSTEROSCOPY WITH HYDROTHERMAL ABLATION (N/A)     Patient location during evaluation: PACU Anesthesia Type: General Level of consciousness: awake and alert Pain management: pain level controlled Vital Signs Assessment: post-procedure vital signs reviewed and stable Respiratory status: spontaneous breathing, nonlabored ventilation, respiratory function stable and patient connected to nasal cannula oxygen Cardiovascular status: blood pressure returned to baseline and stable Postop Assessment: no apparent nausea or vomiting Anesthetic complications: no    Last Vitals:  Vitals:   10/01/16 1500 10/01/16 1515  BP: 116/71 116/70  Pulse: 90 88  Resp: 16 12  Temp:    SpO2: 100% 97%    Last Pain:  Vitals:   10/01/16 1500  TempSrc:   PainSc: 7    Pain Goal: Patients Stated Pain Goal: 4 (10/01/16 1203)               Stuart Mirabile

## 2016-10-01 NOTE — Anesthesia Preprocedure Evaluation (Addendum)
Anesthesia Evaluation  Patient identified by MRN, date of birth, ID band Patient awake    Reviewed: Allergy & Precautions, NPO status , Patient's Chart, lab work & pertinent test results  History of Anesthesia Complications Negative for: history of anesthetic complications  Airway Mallampati: II  TM Distance: >3 FB Neck ROM: Full    Dental  (+) Teeth Intact, Dental Advisory Given   Pulmonary Current Smoker,    breath sounds clear to auscultation       Cardiovascular hypertension, Pt. on medications and Pt. on home beta blockers (-) angina Rhythm:Regular Rate:Normal  02-Mar-2014  Normal sinus rhythm T wave abnormality, consider inferior ischemia Prolonged QT Abnormal ECG   Neuro/Psych  Headaches, Anxiety    GI/Hepatic negative GI ROS, Neg liver ROS,   Endo/Other  Morbid obesity  Renal/GU Renal InsufficiencyRenal diseasenegative Renal ROS     Musculoskeletal   Abdominal (+) + obese,   Peds  Hematology   Anesthesia Other Findings   Reproductive/Obstetrics 03/02/14 preg test: neg                            Anesthesia Physical  Anesthesia Plan  ASA: III  Anesthesia Plan: General   Post-op Pain Management:    Induction: Intravenous  PONV Risk Score and Plan: 2 and Ondansetron, Dexamethasone and Treatment may vary due to age or medical condition  Airway Management Planned: LMA  Additional Equipment:   Intra-op Plan:   Post-operative Plan: Extubation in OR  Informed Consent: I have reviewed the patients History and Physical, chart, labs and discussed the procedure including the risks, benefits and alternatives for the proposed anesthesia with the patient or authorized representative who has indicated his/her understanding and acceptance.   Dental advisory given  Plan Discussed with: CRNA and Surgeon  Anesthesia Plan Comments: (Plan routine monitors, GETA)         Anesthesia Quick Evaluation

## 2016-10-01 NOTE — Progress Notes (Signed)
Patient without complaints  BP 119/76   Pulse 90   Temp 98.5 F (36.9 C) (Oral)   Resp 16   LMP 09/19/2016 (Exact Date)   SpO2 100%  Results for orders placed or performed during the hospital encounter of 10/01/16 (from the past 24 hour(s))  Pregnancy, urine     Status: None   Collection Time: 10/01/16 12:00 PM  Result Value Ref Range   Preg Test, Ur NEGATIVE NEGATIVE   H and P on the chart No changes Proceed with D and C, HYS, HTA Consent signed

## 2016-10-02 ENCOUNTER — Encounter (HOSPITAL_COMMUNITY): Payer: Self-pay | Admitting: Obstetrics and Gynecology

## 2016-10-02 NOTE — Op Note (Signed)
NAMEPARNEET, GLANTZ            ACCOUNT NO.:  [id number]  MEDICAL RECORD NO.:  [id number]  LOCATION:                                 FACILITY:  PHYSICIAN:  Marshall Helane Rima, M.D.    DATE OF BIRTH:  DATE OF PROCEDURE:  10/01/2016 DATE OF DISCHARGE:                              OPERATIVE REPORT   PREOPERATIVE DIAGNOSIS: Menorrhagia  POSTOPERATIVE DIAGNOSIS:  Same  PROCEDURES:  Hysteroscopy, D and C, and hydrothermal ablation.  SURGEON:  Terrace Fontanilla L. Helane Rima, M.D.  ESTIMATED BLOOD LOSS:  Minimal.  COMPLICATIONS:  None.  ANESTHESIA:  MAC with paracervical block.  DESCRIPTION OF PROCEDURE:  The patient was taken to the operating room. She was administered anesthesia.  She was prepped and draped in the usual sterile fashion.  A speculum was placed in the vagina.  The cervix was grasped with a tenaculum and the cervical internal os was gently dilated using Pratt dilators.  The sharp curette was inserted.  The uterus was thoroughly curetted of all tissue.  The hysteroscope with HTA was inserted, and with proper placement inside the uterus, the HTA was used for diagnostic hysteroscopy and endometrial ablation according to the manufacturers specifications for a 10 minute ablation at the end of the procedure.  The intact instrument was removed.  The patient tolerated the procedure well, went to recovery room in stable condition.     Laiyah Exline L. Helane Rima, M.D.     Nevin Bloodgood  D:  10/01/2016  T:  10/01/2016  Job:  [id number]

## 2017-06-21 DIAGNOSIS — Z801 Family history of malignant neoplasm of trachea, bronchus and lung: Secondary | ICD-10-CM | POA: Diagnosis not present

## 2017-06-21 DIAGNOSIS — Z800 Family history of malignant neoplasm of digestive organs: Secondary | ICD-10-CM | POA: Diagnosis not present

## 2017-06-21 DIAGNOSIS — Z01419 Encounter for gynecological examination (general) (routine) without abnormal findings: Secondary | ICD-10-CM | POA: Diagnosis not present

## 2017-06-21 DIAGNOSIS — Z6841 Body Mass Index (BMI) 40.0 and over, adult: Secondary | ICD-10-CM | POA: Diagnosis not present

## 2017-06-21 DIAGNOSIS — Z808 Family history of malignant neoplasm of other organs or systems: Secondary | ICD-10-CM | POA: Diagnosis not present

## 2017-08-24 DIAGNOSIS — Z809 Family history of malignant neoplasm, unspecified: Secondary | ICD-10-CM | POA: Diagnosis not present

## 2017-11-04 ENCOUNTER — Ambulatory Visit: Payer: 59 | Admitting: Internal Medicine

## 2017-11-18 ENCOUNTER — Ambulatory Visit: Payer: 59 | Attending: Internal Medicine | Admitting: Internal Medicine

## 2017-11-18 ENCOUNTER — Encounter: Payer: Self-pay | Admitting: Internal Medicine

## 2017-11-18 VITALS — BP 119/82 | HR 94 | Temp 98.3°F | Resp 16 | Ht 59.0 in | Wt 273.8 lb

## 2017-11-18 DIAGNOSIS — F17200 Nicotine dependence, unspecified, uncomplicated: Secondary | ICD-10-CM

## 2017-11-18 DIAGNOSIS — Z114 Encounter for screening for human immunodeficiency virus [HIV]: Secondary | ICD-10-CM

## 2017-11-18 DIAGNOSIS — G5602 Carpal tunnel syndrome, left upper limb: Secondary | ICD-10-CM | POA: Insufficient documentation

## 2017-11-18 DIAGNOSIS — I10 Essential (primary) hypertension: Secondary | ICD-10-CM | POA: Diagnosis not present

## 2017-11-18 DIAGNOSIS — Z79899 Other long term (current) drug therapy: Secondary | ICD-10-CM | POA: Insufficient documentation

## 2017-11-18 DIAGNOSIS — Z6841 Body Mass Index (BMI) 40.0 and over, adult: Secondary | ICD-10-CM | POA: Insufficient documentation

## 2017-11-18 DIAGNOSIS — F17210 Nicotine dependence, cigarettes, uncomplicated: Secondary | ICD-10-CM | POA: Diagnosis not present

## 2017-11-18 DIAGNOSIS — Z2821 Immunization not carried out because of patient refusal: Secondary | ICD-10-CM

## 2017-11-18 DIAGNOSIS — F419 Anxiety disorder, unspecified: Secondary | ICD-10-CM

## 2017-11-18 MED ORDER — HYDROCHLOROTHIAZIDE 25 MG PO TABS: 25.0000 mg | ORAL_TABLET | Freq: Every morning | ORAL | 1 refills | Status: DC

## 2017-11-18 MED ORDER — SERTRALINE HCL 50 MG PO TABS
50.0000 mg | ORAL_TABLET | Freq: Every day | ORAL | 6 refills | Status: DC
Start: 2017-11-18 — End: 2018-02-18

## 2017-11-18 MED ORDER — LISINOPRIL 40 MG PO TABS: 40.0000 mg | ORAL_TABLET | Freq: Every day | ORAL | 1 refills | Status: DC

## 2017-11-18 NOTE — Patient Instructions (Addendum)
Start Zoloft 50 mg for anxiety.  Take just half a tablet for the first 2 weeks.  After that increase to a full tablet daily.  Follow a Healthy Eating Plan - You can do it! Limit sugary drinks.  Avoid sodas, sweet tea, sport or energy drinks, or fruit drinks.  Drink water, lo-fat milk, or diet drinks. Limit snack foods.   Cut back on candy, cake, cookies, chips, ice cream.  These are a special treat, only in small amounts. Eat plenty of vegetables.  Especially dark green, red, and orange vegetables. Aim for at least 3 servings a day. More is better! Include fruit in your daily diet.  Whole fruit is much healthier than fruit juice! Limit "white" bread, "white" pasta, "white" rice.   Choose "100% whole grain" products, brown or wild rice. Avoid fatty meats. Try "Meatless Monday" and choose eggs or beans one day a week.  When eating meat, choose lean meats like chicken, [place], and fish.  Grill, broil, or bake meats instead of frying, and eat poultry without the skin. Eat less salt.  Avoid frozen pizzas, frozen dinners and salty foods.  Use seasonings other than salt in cooking.  This can help blood pressure and keep you from swelling Beer, wine and liquor have calories.  If you can safely drink alcohol, limit to 1 drink per day for women, 2 drinks for men

## 2017-11-18 NOTE — Progress Notes (Signed)
Patient ID: Diane [name], female    DOB: 1975-09-23  MRN: [id number]  CC: New Patient (Initial Visit)   Subjective: Diane [name] is a 42 y.o. female who presents for new patient visit Her concerns today include:  Patient with history of tobacco dependence, morbid obesity, HTN and anxiety  No previous PCP.  Patient states that her gynecologist who is acting as her PCP.  HTN:  Hx of HTN since 2012. Currently on HCTZ and Lisinopril.  Not limiting salt as much as she should.  No device to check BP.  No SOB/LE edema/HA.  Occasional CP.    Tob dep:  1/2 pk/day for 15 yrs. Stopped during pregnancies.  She does want to quit.  Tried the patches and Nicotrol inhalers in the past but the patches irritated her eczema and she did not tolerate the Nicotrol inh.  Did not like how the Wellbutrin made her feel.  Smokes when she is stress.    C/o intermittent numbness in LT hand x 5-6 mths Works for [employer]. Does a lot of typing.  She is RT hand. Most noticeable at nights and in the mornings  Obesity:  Not doing much to get wgh down but she would like to get her wgh down.  She plans to get membership at [employer] for both her and her 4 kids  Pos dep/anxiety screen:  Trying to move to different area of [place] due to increase crime in her neighborhood.  "I get down on myself sometimes because I am 42 yrs old and I feel I should be further than I am at this age."  Currently on Xanax which she gets through her gynecologist -occasionally she gets nervous when in crowd  Family history, social history and surgical history reviewed.  Patient Active Problem List   Diagnosis Date Noted  . Impaired glucose tolerance 01/07/2014  . ARF (acute renal failure) ([place])   . Cellulitis of left lower extremity   . Sepsis due to cellulitis ([place]) 01/02/2014  . Cellulitis of left lower extremity without foot 01/02/2014  . Anxiety 06/27/2012  . Vaginitis 12/21/2011  . Preventative health care 12/08/2011  . Fatigue  11/02/2011  . Tobacco abuse 11/02/2011  . Elevated blood pressure 11/02/2011  . Morbid obesity ([place]) 11/02/2011  . Headache(784.0) 11/02/2011     Current Outpatient Medications on File Prior to Visit  Medication Sig Dispense Refill  . ALPRAZolam (XANAX) 0.5 MG tablet Take 0.5 mg by mouth at bedtime as needed for anxiety or sleep.      No current facility-administered medications on file prior to visit.     No Known Allergies  Social History   Socioeconomic History  . Marital status: Single    Spouse name: Not on file  . Number of children: 4  . Years of education: Not on file  . Highest education level: Not on file  Occupational History  . Occupation: [employer]  Social Needs  . Financial resource strain: Not on file  . Food insecurity:    Worry: Not on file    Inability: Not on file  . Transportation needs:    Medical: Not on file    Non-medical: Not on file  Tobacco Use  . Smoking status: Current Every Day Smoker    Packs/day: 0.50    Years: 15.00    Pack years: 7.50    Types: Cigarettes  . Smokeless tobacco: Never Used  . Tobacco comment: pt trying to quit down to 3 -4 cig's daily  from 1ppd  Substance and Sexual Activity  . Alcohol use: Yes    Comment:  occasionally  . Drug use: No  . Sexual activity: Yes    Birth control/protection: Surgical  Lifestyle  . Physical activity:    Days per week: Not on file    Minutes per session: Not on file  . Stress: Not on file  Relationships  . Social connections:    Talks on phone: Not on file    Gets together: Not on file    Attends religious service: Not on file    Active member of club or organization: Not on file    Attends meetings of clubs or organizations: Not on file    Relationship status: Not on file  . Intimate partner violence:    Fear of current or ex partner: Not on file    Emotionally abused: Not on file    Physically abused: Not on file    Forced sexual activity: Not on file  Other Topics Concern  .  Not on file  Social History Narrative   [employer]- works from home.     3 children- (54 year old adopted child) 79, 4 and 1.    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 Cancer Mother        throat cancer  . Hypertension Mother   . Heart disease Father 23  . Stroke Father   . Cancer Maternal Aunt        breast cancer  . Cancer Maternal Uncle        prostate  . Diabetes Maternal Grandmother   . Cancer Maternal Uncle        throat cancer  . Cancer Maternal Aunt        breast cancer--in remision  . Kidney disease Neg Hx     Past Surgical History:  Procedure Laterality Date  . CESAREAN SECTION  02-04-2010;  11-30-2006;  07-12-2001  . DILITATION & CURRETTAGE/HYSTROSCOPY WITH HYDROTHERMAL ABLATION N/A 10/01/2016   Procedure: DILATATION & CURETTAGE/HYSTEROSCOPY WITH HYDROTHERMAL ABLATION;  Surgeon: Dian Queen, MD;  Location: [facility];  Service: Gynecology;  Laterality: N/A;  . LAPAROSCOPIC TUBAL LIGATION Bilateral 03/02/2014   Procedure: BILATERAL LAPAROSCOPIC TUBAL LIGATION;  Surgeon: Cyril Mourning, MD;  Location: [facility];  Service: Gynecology;  Laterality: Bilateral;  . TUBAL LIGATION  2014  . WISDOM TOOTH EXTRACTION      ROS: Review of Systems Negative except as stated above  PHYSICAL EXAM: BP 119/82   Pulse 94   Temp 98.3 F (36.8 C) (Oral)   Resp 16   Ht 4\' 11"  (1.499 m)   Wt 273 lb 12.8 oz (124.2 kg)   SpO2 97%   BMI 55.30 kg/m   Physical Exam  General appearance - alert, well appearing, obese middle-aged African-American female and in no distress Mental status - normal mood, behavior, speech, dress, motor activity, and thought processes Mouth - mucous membranes moist, pharynx normal without lesions Neck - supple, no significant adenopathy Chest - clear to auscultation, no wheezes, rales or rhonchi, symmetric air entry Heart -  normal rate, regular rhythm, normal S1, S2, no murmurs, rubs, clicks or gallops Musculoskeletal -grip 5/5 bilaterally.  No wasting of intrinsic muscle of the hands.  Tinel sign negative. Extremities - peripheral pulses normal, no pedal edema, no clubbing or cyanosis Depression screen [facility] 2/9  11/18/2017 07/08/2015  Decreased Interest 0 0  Down, Depressed, Hopeless 2 0  PHQ - 2 Score 2 0  Altered sleeping 1 -  Tired, decreased energy 2 -  Change in appetite 0 -  Feeling bad or failure about yourself  2 -  Trouble concentrating 0 -  Moving slowly or fidgety/restless 0 -  Suicidal thoughts 0 -  PHQ-9 Score 7 -    GAD 7 : Generalized Anxiety Score 11/18/2017  Nervous, Anxious, on Edge 2  Control/stop worrying 2  Worry too much - different things 3  Trouble relaxing 2  Restless 0  Easily annoyed or irritable 2  Afraid - awful might happen 2  Total GAD 7 Score 13    ASSESSMENT AND PLAN: 1. Essential hypertension At goal.  Continue current medications.  Low-salt diet encouraged. - CBC - Comprehensive metabolic panel - Lipid panel - hydrochlorothiazide (HYDRODIURIL) 25 MG tablet; Take 1 tablet (25 mg total) by mouth every morning.  Dispense: 90 tablet; Refill: 1 - lisinopril (PRINIVIL,ZESTRIL) 40 MG tablet; Take 1 tablet (40 mg total) by mouth daily.  Dispense: 90 tablet; Refill: 1  2. Tobacco dependence Patient advised to quit smoking. Discussed health risks associated with smoking including lung and other types of cancers, chronic lung diseases and CV risks.. Pt ready to give trail of quitting.  Discussed methods to help quit including quitting cold [place], use of NRT, Chantix and Bupropion.  Patient not interested in nicotine replacement therapy or bupropion both of which she has tried in the past.  We discussed Chantix.  However I would like to see that anxiety and depression mood are better controlled first before trying her with Chantix   3. Carpal tunnel syndrome of left  wrist Diagnosis discussed.  I recommend trial of cock-up wrist splint.  4. Morbid obesity ([place]) Discussed the importance of healthy eating habits and regular exercise to achieve healthy weight.  Dietary counseling given.  Encouraged her to get in some form of aerobic exercise at least 3 to 4 days a week for 30 minutes.  5. Anxiety Patient advised that I do not use benzos long-term.  I use it as a bridge when starting someone on SSRI or an SNRI.  She is willing to try Zoloft.  I prescribed the 50 mg tablet.  Patient advised to take half a tablet for the first 2 weeks and then after that a full tablet daily. - sertraline (ZOLOFT) 50 MG tablet; Take 1 tablet (50 mg total) by mouth daily.  Dispense: 30 tablet; Refill: 6  6. Influenza vaccination declined   7. Screening for HIV (human immunodeficiency virus) - HIV Antibody (routine testing w rflx)   Patient was given the opportunity to ask questions.  Patient verbalized understanding of the plan and was able to repeat key elements of the plan.   Orders Placed This Encounter  Procedures  . CBC  . Comprehensive metabolic panel  . Lipid panel  . HIV Antibody (routine testing w rflx)     Requested Prescriptions   Signed Prescriptions Disp Refills  . hydrochlorothiazide (HYDRODIURIL) 25 MG tablet 90 tablet 1    Sig: Take 1 tablet (25 mg total) by mouth every morning.  Marland Kitchen lisinopril (PRINIVIL,ZESTRIL) 40 MG tablet 90 tablet 1    Sig: Take 1 tablet (40 mg total) by mouth daily.  . sertraline (ZOLOFT) 50 MG tablet 30 tablet 6    Sig: Take 1 tablet (50 mg total) by mouth daily.    Return in  about 7 weeks (around 01/06/2018).  Karle Plumber, MD, FACP

## 2017-11-19 LAB — COMPREHENSIVE METABOLIC PANEL
ALBUMIN: 4.1 g/dL (ref 3.5–5.5)
ALT: 10 IU/L (ref 0–32)
AST: 13 IU/L (ref 0–40)
Albumin/Globulin Ratio: 1.2 (ref 1.2–2.2)
Alkaline Phosphatase: 63 IU/L (ref 39–117)
BUN / CREAT RATIO: 21 (ref 9–23)
BUN: 14 mg/dL (ref 6–24)
Bilirubin Total: 0.3 mg/dL (ref 0.0–1.2)
CALCIUM: 9 mg/dL (ref 8.7–10.2)
CO2: 22 mmol/L (ref 20–29)
Chloride: 102 mmol/L (ref 96–106)
Creatinine, Ser: 0.68 mg/dL (ref 0.57–1.00)
GFR calc Af Amer: 124 mL/min/{1.73_m2} (ref >59)
GFR, EST NON AFRICAN AMERICAN: 107 mL/min/{1.73_m2} (ref >59)
GLOBULIN, TOTAL: 3.5 g/dL (ref 1.5–4.5)
Glucose: 83 mg/dL (ref 65–99)
Potassium: 3.5 mmol/L (ref 3.5–5.2)
SODIUM: 143 mmol/L (ref 134–144)
Total Protein: 7.6 g/dL (ref 6.0–8.5)

## 2017-11-19 LAB — HIV ANTIBODY (ROUTINE TESTING W REFLEX): HIV SCREEN 4TH GENERATION: NONREACTIVE

## 2017-11-19 LAB — LIPID PANEL
Chol/HDL Ratio: 5.2 ratio — ABNORMAL HIGH (ref 0.0–4.4)
Cholesterol, Total: 244 mg/dL — ABNORMAL HIGH (ref 100–199)
HDL: 47 mg/dL (ref >39)
LDL Calculated: 176 mg/dL — ABNORMAL HIGH (ref 0–99)
Triglycerides: 103 mg/dL (ref 0–149)
VLDL CHOLESTEROL CAL: 21 mg/dL (ref 5–40)

## 2017-11-19 LAB — CBC
HEMATOCRIT: 40.6 % (ref 34.0–46.6)
HEMOGLOBIN: 13.4 g/dL (ref 11.1–15.9)
MCH: 29.9 pg (ref 26.6–33.0)
MCHC: 33 g/dL (ref 31.5–35.7)
MCV: 91 fL (ref 79–97)
Platelets: 352 10*3/uL (ref 150–450)
RBC: 4.48 x10E6/uL (ref 3.77–5.28)
RDW: 13.1 % (ref 12.3–15.4)
WBC: 6.7 10*3/uL (ref 3.4–10.8)

## 2017-11-20 ENCOUNTER — Other Ambulatory Visit: Payer: Self-pay | Admitting: Internal Medicine

## 2017-11-20 MED ORDER — ATORVASTATIN CALCIUM 10 MG PO TABS: 10.0000 mg | ORAL_TABLET | Freq: Every day | ORAL | 3 refills | Status: DC

## 2017-12-02 ENCOUNTER — Other Ambulatory Visit: Payer: Self-pay | Admitting: Internal Medicine

## 2017-12-02 DIAGNOSIS — F419 Anxiety disorder, unspecified: Secondary | ICD-10-CM

## 2017-12-03 ENCOUNTER — Other Ambulatory Visit: Payer: Self-pay | Admitting: Internal Medicine

## 2017-12-03 DIAGNOSIS — F419 Anxiety disorder, unspecified: Secondary | ICD-10-CM

## 2018-01-10 ENCOUNTER — Encounter: Payer: Self-pay | Admitting: Internal Medicine

## 2018-01-10 ENCOUNTER — Ambulatory Visit: Payer: 59 | Admitting: Internal Medicine

## 2018-01-10 NOTE — Telephone Encounter (Signed)
Called the patient Diane [name] for the patient to call me back at [phone] so I can reschedule her appointment

## 2018-02-16 ENCOUNTER — Ambulatory Visit (HOSPITAL_COMMUNITY)
Admission: EM | Admit: 2018-02-16 | Discharge: 2018-02-16 | Disposition: A | Discharge status: Home / Self Care | Payer: 59 | Source: Home / Self Care

## 2018-02-16 ENCOUNTER — Emergency Department (HOSPITAL_COMMUNITY)
Admission: EM | Admit: 2018-02-16 | Discharge: 2018-02-16 | Disposition: A | Discharge status: Home / Self Care | Payer: 59 | Attending: Emergency Medicine | Admitting: Emergency Medicine

## 2018-02-16 ENCOUNTER — Encounter: Payer: Self-pay | Admitting: Emergency Medicine

## 2018-02-16 DIAGNOSIS — F17210 Nicotine dependence, cigarettes, uncomplicated: Secondary | ICD-10-CM | POA: Insufficient documentation

## 2018-02-16 DIAGNOSIS — I10 Essential (primary) hypertension: Secondary | ICD-10-CM | POA: Insufficient documentation

## 2018-02-16 DIAGNOSIS — T7840XA Allergy, unspecified, initial encounter: Secondary | ICD-10-CM | POA: Diagnosis present

## 2018-02-16 DIAGNOSIS — T783XXA Angioneurotic edema, initial encounter: Secondary | ICD-10-CM | POA: Insufficient documentation

## 2018-02-16 DIAGNOSIS — Z79899 Other long term (current) drug therapy: Secondary | ICD-10-CM | POA: Insufficient documentation

## 2018-02-16 LAB — CBC WITH DIFFERENTIAL/PLATELET
ABS IMMATURE GRANULOCYTES: 0.01 10*3/uL (ref 0.00–0.07)
BASOS ABS: 0 10*3/uL (ref 0.0–0.1)
BASOS PCT: 0 %
Eosinophils Absolute: 0 10*3/uL (ref 0.0–0.5)
Eosinophils Relative: 0 %
HCT: 43.7 % (ref 36.0–46.0)
Hemoglobin: 14.1 g/dL (ref 12.0–15.0)
IMMATURE GRANULOCYTES: 0 %
Lymphocytes Relative: 19 %
Lymphs Abs: 1.5 10*3/uL (ref 0.7–4.0)
MCH: 29.8 pg (ref 26.0–34.0)
MCHC: 32.3 g/dL (ref 30.0–36.0)
MCV: 92.4 fL (ref 80.0–100.0)
MONOS PCT: 4 %
Monocytes Absolute: 0.3 10*3/uL (ref 0.1–1.0)
NEUTROS ABS: 6.1 10*3/uL (ref 1.7–7.7)
NEUTROS PCT: 77 %
NRBC: 0 % (ref 0.0–0.2)
PLATELETS: 347 10*3/uL (ref 150–400)
RBC: 4.73 MIL/uL (ref 3.87–5.11)
RDW: 12.9 % (ref 11.5–15.5)
WBC: 8 10*3/uL (ref 4.0–10.5)

## 2018-02-16 LAB — BASIC METABOLIC PANEL
Anion gap: 13 (ref 5–15)
BUN: 7 mg/dL (ref 6–20)
CO2: 27 mmol/L (ref 22–32)
Calcium: 9.1 mg/dL (ref 8.9–10.3)
Chloride: 98 mmol/L (ref 98–111)
Creatinine, Ser: 0.87 mg/dL (ref 0.44–1.00)
GFR calc Af Amer: >60 mL/min (ref >60)
GLUCOSE: 91 mg/dL (ref 70–99)
POTASSIUM: 3.4 mmol/L — AB (ref 3.5–5.1)
Sodium: 138 mmol/L (ref 135–145)

## 2018-02-16 MED ORDER — METHYLPREDNISOLONE SODIUM SUCC 125 MG IJ SOLR
125.0000 mg | Freq: Once | INTRAMUSCULAR | Status: AC
  Administered 2018-02-16: 125 mg via INTRAVENOUS
  Filled 2018-02-16: qty 2

## 2018-02-16 MED ORDER — DIPHENHYDRAMINE HCL 50 MG/ML IJ SOLN
25.0000 mg | Freq: Once | INTRAMUSCULAR | Status: AC
  Administered 2018-02-16: 25 mg via INTRAVENOUS
  Filled 2018-02-16: qty 1

## 2018-02-16 MED ORDER — FAMOTIDINE IN NACL 20-0.9 MG/50ML-% IV SOLN
20.0000 mg | Freq: Once | INTRAVENOUS | Status: AC
  Administered 2018-02-16: 20 mg via INTRAVENOUS
  Filled 2018-02-16: qty 50

## 2018-02-16 MED ORDER — SODIUM CHLORIDE 0.9 % IV BOLUS
1000.0000 mL | Freq: Once | INTRAVENOUS | Status: AC
  Administered 2018-02-16: 1000 mL via INTRAVENOUS

## 2018-02-16 NOTE — ED Triage Notes (Signed)
Pt. Stated my lower lip has been swollen since 100 pm today. I only had a Astronomer for lunch. I went to [facility] sent me here.

## 2018-02-16 NOTE — ED Triage Notes (Signed)
Pt going to ED for further eval of oral swelling and hx of taking lisinopril

## 2018-02-16 NOTE — Discharge Instructions (Addendum)
You have been evaluated for swelling of the lower lip, angioedema.  This could be due to your blood pressure medication lisinopril.  Please stop taking this medication.  Call and follow-up closely with your primary care provider of your condition.  Return promptly if you develop worsening symptoms or if you have any concerns.

## 2018-02-16 NOTE — ED Provider Notes (Signed)
[facility] EMERGENCY DEPARTMENT Provider Note   CSN: [id number] Arrival date & time: 02/16/18  1809     History   Chief Complaint Chief Complaint  Patient presents with  . Allergic Reaction  . Oral Swelling    HPI Diane [name] is a 43 y.o. female.  The history is provided by the patient. No language interpreter was used.  Allergic Reaction     43 year old female with no known drug allergy presenting with complaints of lip swelling.  Patient report for the past 5 hours she has been experiencing swelling involving her lower lip.  Symptom is getting progressively worse.  She described a tightness sensation continued sensation about her lip moderate in severity.  She does not complain of any lightheadedness, dizziness, chest pain, throat swelling, trouble swallowing, shortness of breath, abdominal cramping, or rash.  This has never happened before.  She denies any recent medication changes or body products or environmental changes.  She does take lisinopril blood pressure medication but have been taking that for several years.  She initially went to urgent care but was recommended to come to the ER for further care.  She denies any specific treatment tried.  Past Medical History:  Diagnosis Date  . Anxiety   . Cellulitis   . Hypertension     Patient Active Problem List   Diagnosis Date Noted  . Essential hypertension 11/18/2017  . Carpal tunnel syndrome of left wrist 11/18/2017  . Impaired glucose tolerance 01/07/2014  . Anxiety 06/27/2012  . Preventative health care 12/08/2011  . Tobacco abuse 11/02/2011  . Morbid obesity ([place]) 11/02/2011    Past Surgical History:  Procedure Laterality Date  . CESAREAN SECTION  02-04-2010;  11-30-2006;  07-12-2001  . DILITATION & CURRETTAGE/HYSTROSCOPY WITH HYDROTHERMAL ABLATION N/A 10/01/2016   Procedure: DILATATION & CURETTAGE/HYSTEROSCOPY WITH HYDROTHERMAL ABLATION;  Surgeon: Dian Queen, MD;  Location: [facility];  Service: Gynecology;  Laterality: N/A;  . LAPAROSCOPIC TUBAL LIGATION Bilateral 03/02/2014   Procedure: BILATERAL LAPAROSCOPIC TUBAL LIGATION;  Surgeon: Cyril Mourning, MD;  Location: [facility];  Service: Gynecology;  Laterality: Bilateral;  . TUBAL LIGATION  2014  . WISDOM TOOTH EXTRACTION       OB History    Gravida  6   Para  3   Term  3   Preterm      AB  3   Living  3     SAB  1   TAB  2   Ectopic      Multiple      Live Births               Home Medications    Prior to Admission medications   Medication Sig Start Date End Date Taking? Authorizing Provider  ALPRAZolam Duanne Moron) 0.5 MG tablet Take 0.5 mg by mouth at bedtime as needed for anxiety or sleep.     [provider]  atorvastatin (LIPITOR) 10 MG tablet Take 1 tablet (10 mg total) by mouth daily. 11/20/17   Ladell Pier, MD  hydrochlorothiazide (HYDRODIURIL) 25 MG tablet Take 1 tablet (25 mg total) by mouth every morning. 11/18/17   Ladell Pier, MD  lisinopril (PRINIVIL,ZESTRIL) 40 MG tablet Take 1 tablet (40 mg total) by mouth daily. 11/18/17   Ladell Pier, MD  sertraline (ZOLOFT) 50 MG tablet Take 1 tablet (50 mg total) by mouth daily. 11/18/17   Ladell Pier, MD    Family History Family History  Problem Relation Age of Onset  . Cancer Mother        throat cancer  . Hypertension Mother   . Heart disease Father 60  . Stroke Father   . Cancer Maternal Aunt        breast cancer  . Cancer Maternal Uncle        prostate  . Diabetes Maternal Grandmother   . Cancer Maternal Uncle        throat cancer  . Cancer Maternal Aunt        breast cancer--in remision  . Kidney disease Neg Hx     Social History Social History   Tobacco Use  . Smoking status: Current Every Day Smoker    Packs/day: 0.50    Years: 15.00    Pack years: 7.50    Types: Cigarettes  . Smokeless tobacco: Never Used  . Tobacco comment: pt trying to quit down to 3 -4  cig's daily from 1ppd  Substance Use Topics  . Alcohol use: Yes    Comment:  occasionally  . Drug use: No     Allergies   Patient has no known allergies.   Review of Systems Review of Systems  All other systems reviewed and are negative.    Physical Exam Updated Vital Signs BP 136/75 (BP Location: Left Wrist)   Pulse (!) 108   Temp 98.4 F (36.9 C) (Oral)   Resp 20   Ht 4\' 11"  (1.499 m)   Wt 122.5 kg   SpO2 97%   BMI 54.53 kg/m   Physical Exam Vitals signs and nursing note reviewed.  Constitutional:      General: She is not in acute distress.    Appearance: She is well-developed. She is obese.  HENT:     Head: Atraumatic.     Comments: Edema about the lower lip without any swelling of the tongue or other mucosal region.  Normal phonation.  No neck swelling. Eyes:     Conjunctiva/sclera: Conjunctivae normal.  Neck:     Musculoskeletal: Neck supple.  Cardiovascular:     Rate and Rhythm: Normal rate and regular rhythm.     Pulses: Normal pulses.     Heart sounds: Normal heart sounds.  Pulmonary:     Effort: Pulmonary effort is normal.     Breath sounds: Normal breath sounds. No stridor. No wheezing.  Abdominal:     Palpations: Abdomen is soft.     Tenderness: There is no abdominal tenderness.  Musculoskeletal:        General: No swelling.  Skin:    Findings: No rash.  Neurological:     Mental Status: She is alert.      ED Treatments / Results  Labs (all labs ordered are listed, but only abnormal results are displayed) Labs Reviewed  BASIC METABOLIC PANEL - Abnormal; Notable for the following components:      Result Value   Potassium 3.4 (*)    All other components within normal limits  CBC WITH DIFFERENTIAL/PLATELET    EKG None  Radiology No results found.  Procedures Procedures (including critical care time)  Medications Ordered in ED Medications  sodium chloride 0.9 % bolus 1,000 mL (1,000 mLs Intravenous New Bag/Given 02/16/18 1853)    methylPREDNISolone sodium succinate (SOLU-MEDROL) 125 mg/2 mL injection 125 mg (125 mg Intravenous Given 02/16/18 1943)  diphenhydrAMINE (BENADRYL) injection 25 mg (25 mg Intravenous Given 02/16/18 1941)  famotidine (PEPCID) IVPB 20 mg premix (20 mg Intravenous New Bag/Given 02/16/18 1945)  Initial Impression / Assessment and Plan / ED Course  I have reviewed the triage vital signs and the nursing notes.  Pertinent labs & imaging results that were available during my care of the patient were reviewed by me and considered in my medical decision making (see chart for details).     BP 134/86   Pulse 89   Temp 98.4 F (36.9 C) (Oral)   Resp 20   Ht 4\' 11"  (1.499 m)   Wt 122.5 kg   SpO2 99%   BMI 54.53 kg/m    Final Clinical Impressions(s) / ED Diagnoses   Final diagnoses:  Angioedema, initial encounter    ED Discharge Orders    None     6:32 PM Patient here with swelling of her lower lip suggestive of angioedema likely secondary to ACE inhibitor as patient is currently taking lisinopril.  No other environmental changes.  At this time no evidence of anaphylaxis, no evidence of airway compromise.  Will monitor closely, obtain basic labs and give IV fluid.  10:04 PM Labs are reassuring.  Patient received prednisone's, Benadryl, Pepcid, and was monitor for the past 4 hours without any improvement however symptoms did not worsen.  At this time patient prefers to go home.  She will follow-up closely with PCP and will stop taking lisinopril.  She understand that she should return promptly if she develops worsening of symptoms such as tongue swelling or neck discomfort or any trouble breathing.  Care discussed with Dr. Ronnald Nian.     Domenic Moras, PA-C 02/16/18 2207    Lennice Sites, DO 02/16/18 2225

## 2018-02-17 ENCOUNTER — Telehealth: Payer: Self-pay | Admitting: Internal Medicine

## 2018-02-17 NOTE — Telephone Encounter (Signed)
Pt can be seen on walk in schedule or any provider 03/03/18 is to far for her to be seen for change in lisinopril and for the allergic reaction   Per Roylene Reason will be in clinic tomorrow morning see if pt can come in tomorrow

## 2018-02-17 NOTE — Telephone Encounter (Signed)
Patient called to schedule an appointment for an alternative for the lisinopril as well as from her visit to the ED  she had an allergic reaction/side effect from the lisinopril. She experienced lip swelling. Please follow up.

## 2018-02-17 NOTE — Telephone Encounter (Signed)
Checked schedule and unfortunately there have been no cancellations for a sooner appointment. Patient appt for 02/20 will be the soonest pending any cancellations.

## 2018-02-17 NOTE — Telephone Encounter (Signed)
Can you find pt a sooner appointment please.

## 2018-02-17 NOTE — Telephone Encounter (Signed)
Diane [name] could you take care of this for me please

## 2018-02-18 ENCOUNTER — Encounter: Payer: Self-pay | Admitting: Nurse Practitioner

## 2018-02-18 ENCOUNTER — Ambulatory Visit: Payer: 59 | Attending: Nurse Practitioner | Admitting: Nurse Practitioner

## 2018-02-18 VITALS — BP 120/81 | HR 80 | Temp 99.1°F | Ht 59.0 in | Wt 272.0 lb

## 2018-02-18 DIAGNOSIS — F17200 Nicotine dependence, unspecified, uncomplicated: Secondary | ICD-10-CM

## 2018-02-18 DIAGNOSIS — I10 Essential (primary) hypertension: Secondary | ICD-10-CM

## 2018-02-18 DIAGNOSIS — E7800 Pure hypercholesterolemia, unspecified: Secondary | ICD-10-CM | POA: Diagnosis not present

## 2018-02-18 MED ORDER — AMLODIPINE BESYLATE 5 MG PO TABS: 5.0000 mg | ORAL_TABLET | Freq: Every day | ORAL | 3 refills | Status: DC

## 2018-02-18 MED ORDER — HYDROCHLOROTHIAZIDE 25 MG PO TABS: 25.0000 mg | ORAL_TABLET | Freq: Every morning | ORAL | 1 refills | Status: DC

## 2018-02-18 MED ORDER — VARENICLINE TARTRATE 0.5 MG X 11 & 1 MG X 42 PO MISC: ORAL | 0 refills | Status: DC

## 2018-02-18 NOTE — Progress Notes (Signed)
Assessment & Plan:  [name] was seen today for hospitalization follow-up.  Diagnoses and all orders for this visit:  Essential hypertension -     hydrochlorothiazide (HYDRODIURIL) 25 MG tablet; Take 1 tablet (25 mg total) by mouth every morning. -     amLODipine (NORVASC) 5 MG tablet; Take 1 tablet (5 mg total) by mouth daily. Continue all antihypertensives as prescribed.  Remember to bring in your blood pressure log with you for your follow up appointment.  DASH/Mediterranean Diets are healthier choices for HTN.    Elevated cholesterol -     Lipid panel INSTRUCTIONS: Work on a low fat, heart healthy diet and participate in regular aerobic exercise program by working out at least 150 minutes per week; 5 days a week-30 minutes per day. Avoid red meat, fried foods. junk foods, sodas, sugary drinks, unhealthy snacking, alcohol and smoking.  Drink at least 48oz of water per day and monitor your carbohydrate intake daily.   Morbid obesity ([place]) Discussed diet and exercise for person with BMI >54. Instructed: You must burn more calories than you eat. Losing 5 percent of your body weight should be considered a success. In the longer term, losing more than 15 percent of your body weight and staying at this weight is an extremely good result. However, keep in mind that even losing 5 percent of your body weight leads to important health benefits, so try not to get discouraged if you're not able to lose more than this.   Tobacco dependence -     varenicline (CHANTIX STARTING MONTH PAK) 0.5 MG X 11 & 1 MG X 42 tablet; Take one 0.5 mg tablet by mouth once daily for 3 days, then increase to one 0.5 mg tablet twice daily for 4 days, then increase to one 1 mg tablet twice daily. [name] continues to smoke 1/2 Pack cigarettes per day. [name] was counseled on the dangers of tobacco use, and was advised to quit. We reviewed specific strategies to maximize success, including removing cigarettes and  smoking materials from environment, stress management and support of family/friends as well as pharmacological alternatives. 3. A total of 5 minutes was spent on counseling for smoking cessation and [name] is ready to quit and has chosen Chantix to start today.  [name] was offered Wellbutrin, Chantix, Nicotine patch, Nicotine gum or lozenges.  Due to out of pocket costs [name] was also given smoking cessation support and advised to contact: the Smoking Cessation hotline: 1-800-QUIT-NOW.  [name] was also informed of our Smoking cessation classes which are also available through [facility] by calling [phone] or visit our website at [email or web address]  5. Will follow up at next scheduled office visit.     Patient has been counseled on age-appropriate routine health concerns for screening and prevention. These are reviewed and up-to-date. Referrals have been placed accordingly. Immunizations are up-to-date or declined.    Subjective:   Chief Complaint  Patient presents with  . Hospitalization Follow-up    Pt. is here for HFU. Pt. is currently not taking any medication and had a reaction to Lisinpril, swelling lip.    HPI Diane [name] 43 y.o. female presents to office today for hospital follow up.   Allergic Reaction She was seen in the ED with lower lip swelling on 02-16-2018. It was felt that her lip swelling was related to her lisinopril. She was treated with antihistamines, solu medrol and discharged home in stable conditions. owever she has  taken this medication for years. She was instructed to stop lisinopril and is here today for follow up. Unclear if allergic reaction was to lisinopril as this is not a new medication for her. However will start her on amlodipine and have her follow up with her PCP for BP recheck.    Essential Hypertension BP Readings from Last 3 Encounters:  02/18/18 120/81  02/16/18 132/82  11/18/17 119/82  Start amlodipine  5mg . She is currently taking HCTZ 25mg  daily. Denies chest pain, shortness of breath, palpitations, lightheadedness, dizziness, headaches or BLE edema.   Hyperlipidemia Patient presents for follow up to hyperlipidemia.  She is medication compliant taking atorvastatin 10mg  daily. . She is not diet compliant and denies  statin intolerance including myalgias.  Lab Results  Component Value Date   CHOL 229 (H) 02/18/2018   Lab Results  Component Value Date   HDL 47 02/18/2018   Lab Results  Component Value Date   LDLCALC 162 (H) 02/18/2018   Lab Results  Component Value Date   TRIG 100 02/18/2018   Lab Results  Component Value Date   CHOLHDL 4.9 (H) 02/18/2018  Review of Systems  Constitutional: Negative for fever, malaise/fatigue and weight loss.  HENT: Negative.  Negative for nosebleeds.   Eyes: Negative.  Negative for blurred vision, double vision and photophobia.  Respiratory: Negative.  Negative for cough and shortness of breath.   Cardiovascular: Negative.  Negative for chest pain, palpitations and leg swelling.  Gastrointestinal: Negative.  Negative for heartburn, nausea and vomiting.  Musculoskeletal: Negative.  Negative for myalgias.  Neurological: Negative.  Negative for dizziness, focal weakness, seizures and headaches.  Psychiatric/Behavioral: Negative.  Negative for suicidal ideas.    Past Medical History:  Diagnosis Date  . Anxiety   . Cellulitis   . Hypertension     Past Surgical History:  Procedure Laterality Date  . CESAREAN SECTION  02-04-2010;  11-30-2006;  07-12-2001  . DILITATION & CURRETTAGE/HYSTROSCOPY WITH HYDROTHERMAL ABLATION N/A 10/01/2016   Procedure: DILATATION & CURETTAGE/HYSTEROSCOPY WITH HYDROTHERMAL ABLATION;  Surgeon: Dian Queen, MD;  Location: [facility];  Service: Gynecology;  Laterality: N/A;  . LAPAROSCOPIC TUBAL LIGATION Bilateral 03/02/2014   Procedure: BILATERAL LAPAROSCOPIC TUBAL LIGATION;  Surgeon: Cyril Mourning, MD;  Location:  [place];  Service: Gynecology;  Laterality: Bilateral;  . TUBAL LIGATION  2014  . WISDOM TOOTH EXTRACTION      Family History  Problem Relation Age of Onset  . Cancer Mother        throat cancer  . Hypertension Mother   . Heart disease Father 36  . Stroke Father   . Cancer Maternal Aunt        breast cancer  . Cancer Maternal Uncle        prostate  . Diabetes Maternal Grandmother   . Cancer Maternal Uncle        throat cancer  . Cancer Maternal Aunt        breast cancer--in remision  . Kidney disease Neg Hx     Social History Reviewed with no changes to be made today.   Outpatient Medications Prior to Visit  Medication Sig Dispense Refill  . atorvastatin (LIPITOR) 10 MG tablet Take 1 tablet (10 mg total) by mouth daily. (Patient not taking: Reported on 02/18/2018) 30 tablet 3  . ALPRAZolam (XANAX) 0.5 MG tablet Take 0.5 mg by mouth at bedtime as needed for anxiety or sleep.     . hydrochlorothiazide (HYDRODIURIL) 25 MG tablet Take  1 tablet (25 mg total) by mouth every morning. (Patient not taking: Reported on 02/18/2018) 90 tablet 1  . sertraline (ZOLOFT) 50 MG tablet Take 1 tablet (50 mg total) by mouth daily. (Patient not taking: Reported on 02/18/2018) 30 tablet 6   No facility-administered medications prior to visit.     No Known Allergies     Objective:    BP 120/81 (BP Location: Right Arm, Patient Position: Sitting, Cuff Size: Large) Comment (Cuff Size): thigh cuff  Pulse 80   Temp 99.1 F (37.3 C) (Oral)   Ht 4\' 11"  (1.499 m)   Wt 272 lb (123.4 kg)   SpO2 99%   BMI 54.94 kg/m  Wt Readings from Last 3 Encounters:  02/18/18 272 lb (123.4 kg)  02/16/18 270 lb (122.5 kg)  11/18/17 273 lb 12.8 oz (124.2 kg)    Physical Exam Vitals signs and nursing note reviewed.  Constitutional:      Appearance: She is well-developed.  HENT:     Head: Normocephalic and atraumatic.  Neck:     Musculoskeletal: Normal range of motion.  Cardiovascular:      Rate and Rhythm: Normal rate and regular rhythm.     Heart sounds: Normal heart sounds. No murmur. No friction rub. No gallop.   Pulmonary:     Effort: Pulmonary effort is normal. No tachypnea or respiratory distress.     Breath sounds: Normal breath sounds. No decreased breath sounds, wheezing, rhonchi or rales.  Chest:     Chest wall: No tenderness.  Abdominal:     General: Bowel sounds are normal.     Palpations: Abdomen is soft.  Musculoskeletal: Normal range of motion.  Skin:    General: Skin is warm and dry.  Neurological:     Mental Status: She is alert and oriented to person, place, and time.     Coordination: Coordination normal.  Psychiatric:        Behavior: Behavior normal. Behavior is cooperative.        Thought Content: Thought content normal.        Judgment: Judgment normal.          Patient has been counseled extensively about nutrition and exercise as well as the importance of adherence with medications and regular follow-up. The patient was given clear instructions to go to ER or return to medical center if symptoms don't improve, worsen or new problems develop. The patient verbalized understanding.   Follow-up: Return in about 3 weeks (around 03/11/2018) for BP recheck with LUKE and also schedule f/u with Dr. Wynetta Emery ; evaluate for CVD.   Gildardo Pounds, FNP-BC [facility] [place], [place]   02/18/2018, 8:08 PM

## 2018-02-19 LAB — LIPID PANEL
Chol/HDL Ratio: 4.9 ratio — ABNORMAL HIGH (ref 0.0–4.4)
Cholesterol, Total: 229 mg/dL — ABNORMAL HIGH (ref 100–199)
HDL: 47 mg/dL (ref >39)
LDL CALC: 162 mg/dL — AB (ref 0–99)
TRIGLYCERIDES: 100 mg/dL (ref 0–149)
VLDL Cholesterol Cal: 20 mg/dL (ref 5–40)

## 2018-02-19 MED ORDER — ATORVASTATIN CALCIUM 20 MG PO TABS: 20.0000 mg | ORAL_TABLET | Freq: Every day | ORAL | 0 refills | Status: DC

## 2018-03-03 ENCOUNTER — Inpatient Hospital Stay: Payer: 59 | Admitting: Critical Care Medicine

## 2018-03-11 ENCOUNTER — Encounter: Payer: 59 | Admitting: Pharmacist

## 2018-03-14 ENCOUNTER — Encounter: Payer: 59 | Admitting: Pharmacist

## 2018-03-14 ENCOUNTER — Encounter: Payer: Self-pay | Admitting: Nurse Practitioner

## 2018-03-24 ENCOUNTER — Other Ambulatory Visit: Payer: Self-pay

## 2018-03-24 ENCOUNTER — Ambulatory Visit: Payer: 59 | Attending: Internal Medicine | Admitting: Internal Medicine

## 2018-03-24 ENCOUNTER — Encounter: Payer: Self-pay | Admitting: Internal Medicine

## 2018-03-24 VITALS — BP 128/68 | HR 105 | Temp 98.7°F | Resp 16 | Ht 59.0 in | Wt 275.8 lb

## 2018-03-24 DIAGNOSIS — F17210 Nicotine dependence, cigarettes, uncomplicated: Secondary | ICD-10-CM

## 2018-03-24 DIAGNOSIS — E785 Hyperlipidemia, unspecified: Secondary | ICD-10-CM | POA: Diagnosis not present

## 2018-03-24 DIAGNOSIS — E876 Hypokalemia: Secondary | ICD-10-CM

## 2018-03-24 DIAGNOSIS — I10 Essential (primary) hypertension: Secondary | ICD-10-CM

## 2018-03-24 DIAGNOSIS — F17200 Nicotine dependence, unspecified, uncomplicated: Secondary | ICD-10-CM

## 2018-03-24 DIAGNOSIS — Z6841 Body Mass Index (BMI) 40.0 and over, adult: Secondary | ICD-10-CM

## 2018-03-24 NOTE — Progress Notes (Signed)
Patient ID: Diane [name], female    DOB: April 01, 1975  MRN: [id number]  CC: Hypertension   Subjective: Diane [name] is a 43 y.o. female who presents for chronic ds management. Last seen 11/2017 Her concerns today include:  Patient with history of tob dep, morbid obesity, HTN, CTS and anxiety  Anxiety:  Did not fill prescription for Zoloft that was prescribed on last visit.  She states that she was just going through stressful events at that time but those issues have resolved.    HTN:  Lisinopril caused angio-edema, ER visit 02/2018.  Doing okay on HCTZ and Amlodipine No device to check Trying to limit salt Denies chest pains or shortness of breath  Obesity:  She has dec drinking sodas.  She was drinking 2-3 20 oz a day.  Not drinking sodas every day anymore Her insurance just sent out email about gym membership.  She plans to get gym membership at the [facility] for her and her children.  She has not set a goal as to where she wants to be for her weight.  She states that she just wants to try to eat healthier and start exercising.  She has seen nutritionist in the past and declines referral at this time stating that she knows what she needs to do.  Tob dep:  Prescribed Chantix last mth by NP.  She did not like hte way it made her feel so she d/c taking.  She plans to work on quitting without the assistance of medication  HL: Cholesterol elevated on last visit with me.  I had sent her a my chart message recommending starting Lipitor.  However she did not start taking it until she had seen the nurse practitioner last month and had levels rechecked.  She is tolerating the Lipitor. Patient Active Problem List   Diagnosis Date Noted  . Essential hypertension 11/18/2017  . Carpal tunnel syndrome of left wrist 11/18/2017  . Impaired glucose tolerance 01/07/2014  . Anxiety 06/27/2012  . Preventative health care 12/08/2011  . Tobacco abuse 11/02/2011  . Morbid obesity ([place]) 11/02/2011     Current Outpatient Medications on File Prior to Visit  Medication Sig Dispense Refill  . amLODipine (NORVASC) 5 MG tablet Take 1 tablet (5 mg total) by mouth daily. 90 tablet 3  . atorvastatin (LIPITOR) 20 MG tablet Take 1 tablet (20 mg total) by mouth daily. 90 tablet 0  . hydrochlorothiazide (HYDRODIURIL) 25 MG tablet Take 1 tablet (25 mg total) by mouth every morning. 90 tablet 1   No current facility-administered medications on file prior to visit.     Allergies  Allergen Reactions  . Lisinopril Swelling    Lip swelling    Social History   Socioeconomic History  . Marital status: Single    Spouse name: Not on file  . Number of children: 4  . Years of education: Not on file  . Highest education level: Not on file  Occupational History  . Occupation: [employer]  Social Needs  . Financial resource strain: Not on file  . Food insecurity:    Worry: Not on file    Inability: Not on file  . Transportation needs:    Medical: Not on file    Non-medical: Not on file  Tobacco Use  . Smoking status: Current Every Day Smoker    Packs/day: 0.50    Years: 15.00    Pack years: 7.50    Types: Cigarettes  . Smokeless tobacco: Never Used  .  Tobacco comment: pt trying to quit down to 3 -4 cig's daily from 1ppd  Substance and Sexual Activity  . Alcohol use: Yes    Comment:  occasionally  . Drug use: No  . Sexual activity: Yes    Birth control/protection: Surgical  Lifestyle  . Physical activity:    Days per week: Not on file    Minutes per session: Not on file  . Stress: Not on file  Relationships  . Social connections:    Talks on phone: Not on file    Gets together: Not on file    Attends religious service: Not on file    Active member of club or organization: Not on file    Attends meetings of clubs or organizations: Not on file    Relationship status: Not on file  . Intimate partner violence:    Fear of current or ex partner: Not on file    Emotionally abused: Not on  file    Physically abused: Not on file    Forced sexual activity: Not on file  Other Topics Concern  . Not on file  Social History Narrative   [employer]- works from home.     3 children- (4 year old adopted child) 40, 4 and 1.    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 Cancer Mother        throat cancer  . Hypertension Mother   . Heart disease Father 72  . Stroke Father   . Cancer Maternal Aunt        breast cancer  . Cancer Maternal Uncle        prostate  . Diabetes Maternal Grandmother   . Cancer Maternal Uncle        throat cancer  . Cancer Maternal Aunt        breast cancer--in remision  . Kidney disease Neg Hx     Past Surgical History:  Procedure Laterality Date  . CESAREAN SECTION  02-04-2010;  11-30-2006;  07-12-2001  . DILITATION & CURRETTAGE/HYSTROSCOPY WITH HYDROTHERMAL ABLATION N/A 10/01/2016   Procedure: DILATATION & CURETTAGE/HYSTEROSCOPY WITH HYDROTHERMAL ABLATION;  Surgeon: Dian Queen, MD;  Location: [facility];  Service: Gynecology;  Laterality: N/A;  . LAPAROSCOPIC TUBAL LIGATION Bilateral 03/02/2014   Procedure: BILATERAL LAPAROSCOPIC TUBAL LIGATION;  Surgeon: Cyril Mourning, MD;  Location: [facility];  Service: Gynecology;  Laterality: Bilateral;  . TUBAL LIGATION  2014  . WISDOM TOOTH EXTRACTION      ROS: Review of Systems Negative except as stated above  PHYSICAL EXAM: BP 128/68   Pulse (!) 105   Temp 98.7 F (37.1 C) (Oral)   Resp 16   Ht 4\' 11"  (1.499 m)   Wt 275 lb 12.8 oz (125.1 kg)   SpO2 97%   BMI 55.70 kg/m   Physical Exam  General appearance - alert, well appearing, morbidly obese middle-age African-American female and in no distress Mental status - normal mood, behavior, speech, dress, motor activity, and thought processes Neck - supple, no significant adenopathy Chest - clear to auscultation, no  wheezes, rales or rhonchi, symmetric air entry Heart - normal rate, regular rhythm, normal S1, S2, no murmurs, rubs, clicks or gallops Extremities - peripheral pulses normal, no pedal edema, no clubbing or cyanosis   CMP Latest Ref Rng & Units 02/16/2018 11/18/2017 09/23/2016  Glucose 70 - 99  mg/dL 91 83 121(H)  BUN 6 - 20 mg/dL 7 14 11   Creatinine 0.44 - 1.00 mg/dL 0.87 0.68 0.79  Sodium 135 - 145 mmol/L 138 143 138  Potassium 3.5 - 5.1 mmol/L 3.4(L) 3.5 4.0  Chloride 98 - 111 mmol/L 98 102 100(L)  CO2 22 - 32 mmol/L 27 22 29   Calcium 8.9 - 10.3 mg/dL 9.1 9.0 9.2  Total Protein 6.0 - 8.5 g/dL - 7.6 -  Total Bilirubin 0.0 - 1.2 mg/dL - 0.3 -  Alkaline Phos 39 - 117 IU/L - 63 -  AST 0 - 40 IU/L - 13 -  ALT 0 - 32 IU/L - 10 -   Lipid Panel     Component Value Date/Time   CHOL 229 (H) 02/18/2018 1223   TRIG 100 02/18/2018 1223   HDL 47 02/18/2018 1223   CHOLHDL 4.9 (H) 02/18/2018 1223   CHOLHDL 4.7 11/02/2011 1051   VLDL 16 11/02/2011 1051   LDLCALC 162 (H) 02/18/2018 1223    CBC    Component Value Date/Time   WBC 8.0 02/16/2018 1829   RBC 4.73 02/16/2018 1829   HGB 14.1 02/16/2018 1829   HGB 13.4 11/18/2017 1432   HCT 43.7 02/16/2018 1829   HCT 40.6 11/18/2017 1432   PLT 347 02/16/2018 1829   PLT 352 11/18/2017 1432   MCV 92.4 02/16/2018 1829   MCV 91 11/18/2017 1432   MCH 29.8 02/16/2018 1829   MCHC 32.3 02/16/2018 1829   RDW 12.9 02/16/2018 1829   RDW 13.1 11/18/2017 1432   LYMPHSABS 1.5 02/16/2018 1829   MONOABS 0.3 02/16/2018 1829   EOSABS 0.0 02/16/2018 1829   BASOSABS 0.0 02/16/2018 1829    ASSESSMENT AND PLAN: 1. Essential hypertension At goal.  Continue current dose of HCTZ and Norvasc.  Continue DASH diet.  2. Morbid obesity ([place]) Patient declines referral to nutritionist.  I did some dietary counseling.  Encouraged her to stop all sugary drinks including sodas, juices and sweet tea. She plans to join the [facility] and start going several days a week.  3.  Tobacco dependence Patient did not tolerate Chantix.  She is not wanting to try anything else at this time but states that she would work on cutting back some more once she starts exercising.  4. Hyperlipidemia, unspecified hyperlipidemia type Tolerating Lipitor  5. Hypokalemia Advised to eat bananas several times a week. - Potassium   Patient was given the opportunity to ask questions.  Patient verbalized understanding of the plan and was able to repeat key elements of the plan.   Orders Placed This Encounter  Procedures  . Potassium     Requested Prescriptions    No prescriptions requested or ordered in this encounter    Return in about 3 months (around 06/24/2018).  Karle Plumber, MD, FACP

## 2018-03-24 NOTE — Patient Instructions (Signed)
Your blood pressure is on goal of 130/80 or lower.  Continue the amlodipine and hydrochlorothiazide.  Try eating bananas a few times a week to help keep your potassium level up.  Try setting a quit date to quit smoking.   Obesity, Adult Obesity is having too much body fat. If you have a BMI of 30 or more, you are obese. BMI is a number that explains how much body fat you have. Obesity is often caused by taking in (consuming) more calories than your body uses. Obesity can cause serious health problems. Changing your lifestyle can help to treat obesity. Follow these instructions at home: Eating and drinking   Follow advice from your doctor about what to eat and drink. Your doctor may tell you to: ? Cut down on (limit) fast foods, sweets, and processed snack foods. ? Choose low-fat options. For example, choose low-fat milk instead of whole milk. ? Eat 5 or more servings of fruits or vegetables every day. ? Eat at home more often. This gives you more control over what you eat. ? Choose healthy foods when you eat out. ? Learn what a healthy portion size is. A portion size is the amount of a certain food that is healthy for you to eat at one time. This is different for each person. ? Keep low-fat snacks available. ? Avoid sugary drinks. These include soda, fruit juice, iced tea that is sweetened with sugar, and flavored milk. ? Eat a healthy breakfast.  Drink enough water to keep your pee (urine) clear or pale yellow.  Do not go without eating for long periods of time (do not fast).  Do not go on popular or trendy diets (fad diets). Physical Activity  Exercise often, as told by your doctor. Ask your doctor: ? What types of exercise are safe for you. ? How often you should exercise.  Warm up and stretch before being active.  Do slow stretching after being active (cool down).  Rest between times of being active. Lifestyle  Limit how much time you spend in front of your TV, computer,  or video game system (be less sedentary).  Find ways to reward yourself that do not involve food.  Limit alcohol intake to no more than 1 drink a day for nonpregnant women and 2 drinks a day for men. One drink equals 12 oz of beer, 5 oz of wine, or 1 oz of hard liquor. General instructions  Keep a weight loss journal. This can help you keep track of: ? The food that you eat. ? The exercise that you do.  Take over-the-counter and prescription medicines only as told by your doctor.  Take vitamins and supplements only as told by your doctor.  Think about joining a support group. Your doctor may be able to help with this.  Keep all follow-up visits as told by your doctor. This is important. Contact a doctor if:  You cannot meet your weight loss goal after you have changed your diet and lifestyle for 6 weeks. This information is not intended to replace advice given to you by your health care provider. Make sure you discuss any questions you have with your health care provider. Document Released: 03/23/2011 Document Revised: 06/06/2015 Document Reviewed: 10/17/2014 Elsevier Interactive Patient Education  2019 [employer].

## 2018-03-25 ENCOUNTER — Other Ambulatory Visit: Payer: Self-pay | Admitting: Internal Medicine

## 2018-03-25 LAB — POTASSIUM: POTASSIUM: 3.4 mmol/L — AB (ref 3.5–5.2)

## 2018-03-25 MED ORDER — POTASSIUM CHLORIDE ER 8 MEQ PO CPCR: 8.0000 meq | ORAL_CAPSULE | Freq: Every day | ORAL | 1 refills | Status: DC

## 2018-05-17 ENCOUNTER — Other Ambulatory Visit: Payer: Self-pay | Admitting: Nurse Practitioner

## 2018-06-30 ENCOUNTER — Ambulatory Visit: Payer: 59 | Admitting: Internal Medicine

## 2018-07-21 ENCOUNTER — Ambulatory Visit: Payer: Self-pay | Admitting: Internal Medicine

## 2018-08-10 ENCOUNTER — Other Ambulatory Visit: Payer: Self-pay | Admitting: Internal Medicine

## 2018-08-10 ENCOUNTER — Other Ambulatory Visit: Payer: Self-pay | Admitting: Nurse Practitioner

## 2018-08-10 DIAGNOSIS — I10 Essential (primary) hypertension: Secondary | ICD-10-CM

## 2018-09-08 ENCOUNTER — Other Ambulatory Visit: Payer: Self-pay | Admitting: Internal Medicine

## 2018-09-08 DIAGNOSIS — I10 Essential (primary) hypertension: Secondary | ICD-10-CM

## 2018-10-24 ENCOUNTER — Other Ambulatory Visit: Payer: Self-pay | Admitting: Internal Medicine

## 2018-10-24 DIAGNOSIS — I10 Essential (primary) hypertension: Secondary | ICD-10-CM

## 2018-11-17 ENCOUNTER — Other Ambulatory Visit: Payer: Self-pay | Admitting: Internal Medicine

## 2018-11-19 ENCOUNTER — Other Ambulatory Visit: Payer: Self-pay | Admitting: Internal Medicine

## 2018-11-19 DIAGNOSIS — I10 Essential (primary) hypertension: Secondary | ICD-10-CM

## 2018-12-07 ENCOUNTER — Telehealth: Payer: Self-pay | Admitting: *Deleted

## 2018-12-07 NOTE — Telephone Encounter (Signed)
Called patient and LMOM with patient mobile number and called patient home number and a female picked up and stated it was not patient and hung up after staff introduced self. Patient is needing to schedule appt for med refills due to patient request via her mychart.

## 2018-12-10 ENCOUNTER — Other Ambulatory Visit: Payer: Self-pay | Admitting: Internal Medicine

## 2019-02-07 ENCOUNTER — Other Ambulatory Visit: Payer: Self-pay | Admitting: Nurse Practitioner

## 2019-02-07 DIAGNOSIS — I10 Essential (primary) hypertension: Secondary | ICD-10-CM

## 2019-02-17 ENCOUNTER — Other Ambulatory Visit: Payer: Self-pay

## 2019-02-17 ENCOUNTER — Encounter: Payer: Self-pay | Admitting: Family

## 2019-02-17 ENCOUNTER — Ambulatory Visit: Payer: 59 | Attending: Family | Admitting: Family

## 2019-02-17 VITALS — BP 129/85 | HR 96 | Temp 97.1°F | Ht 59.0 in | Wt 291.0 lb

## 2019-02-17 DIAGNOSIS — G479 Sleep disorder, unspecified: Secondary | ICD-10-CM | POA: Diagnosis not present

## 2019-02-17 DIAGNOSIS — E785 Hyperlipidemia, unspecified: Secondary | ICD-10-CM

## 2019-02-17 DIAGNOSIS — I10 Essential (primary) hypertension: Secondary | ICD-10-CM

## 2019-02-17 DIAGNOSIS — E876 Hypokalemia: Secondary | ICD-10-CM | POA: Diagnosis not present

## 2019-02-17 MED ORDER — ATORVASTATIN CALCIUM 20 MG PO TABS: 20.0000 mg | ORAL_TABLET | Freq: Every day | ORAL | 0 refills | Status: DC

## 2019-02-17 MED ORDER — POTASSIUM CHLORIDE ER 8 MEQ PO CPCR: 8.0000 meq | ORAL_CAPSULE | Freq: Every day | ORAL | 0 refills | Status: DC

## 2019-02-17 MED ORDER — AMLODIPINE BESYLATE 5 MG PO TABS: 5.0000 mg | ORAL_TABLET | Freq: Every day | ORAL | 0 refills | Status: DC

## 2019-02-17 MED ORDER — HYDROCHLOROTHIAZIDE 25 MG PO TABS: 25.0000 mg | ORAL_TABLET | Freq: Every day | ORAL | 2 refills | Status: DC

## 2019-02-17 NOTE — Progress Notes (Signed)
Patient ID: Diane [name], female    DOB: Jun 07, 1975  MRN: [id number]  CC: Medication Refill   Subjective: Diane [name] is a 44 y.o. female  with history of essential hypertension, impaired glucose tolerance, tobacco use, anxiety, and hyperlipidemia who presents for medication management.   1. HYPERTENSION FOLLOW-UP:  Currently taking: see medication list Med Adherence: []  Yes    [x]  No Medication side effects: []  Yes    [x]  No Adherence with salt restriction: []  Yes    [x]  No Home Monitoring?: []  Yes    [x]  No Monitoring Frequency: []  Yes    [x]  No Home BP results range: []  Yes    [x]  No  SOB? []  Yes    [x]  No Chest Pain?: []  Yes    [x]  No Leg swelling?: []  Yes    [x]  No Headaches?: [x]  Yes    []  No Dizziness? []  Yes    [x]  No Comments: Has been 3 days without Amlodipine and Hydrochlorothiazide. Admits occasional headaches, denies over-the-counter medication use for headaches. Denies exercising. Last seen March 2020 by Dr. Wynetta Emery were patient was counseled on continuing medication regimen.  2. HYPERLIPIDEMIA FOLLOW-UP:   Last Lipid Panel results:  HDL  Date Value Ref Range Status  02/18/2018 47 >39 mg/dL Final   Triglycerides  Date Value Ref Range Status  02/18/2018 100 0 - 149 mg/dL Final    Med Adherence: []  Yes    [x]  No Medication side effects: []  Yes    [x]  No Muscle aches:  []  Yes    [x]  No Diet Adherence: []  Yes    [x]  No Comments: At least 1 month without Atorvastatin. Denies exercising. Last seen March 2020 by Dr. Wynetta Emery and was counseled to continue medication regimen.   3. SLEEP ISSUES:  Can not get to sleep at night related to stress and worrying. Has been at least 6 months since sleep issues began. Has tried melatonin, Tylenol PM, and soothing music but does not work. Reports that she does watch television in bed until she gets sleep and lays in bed at night if she is unable to get to sleep. Admits drinking caffeine and sugary drinks before bed  occasionally but typically has water around bedtime.    Patient Active Problem List   Diagnosis Date Noted  . Hyperlipidemia 03/24/2018  . Essential hypertension 11/18/2017  . Carpal tunnel syndrome of left wrist 11/18/2017  . Impaired glucose tolerance 01/07/2014  . Anxiety 06/27/2012  . Preventative health care 12/08/2011  . Tobacco abuse 11/02/2011  . Morbid obesity ([place]) 11/02/2011     Current Outpatient Medications on File Prior to Visit  Medication Sig Dispense Refill  . amLODipine (NORVASC) 5 MG tablet Take 1 tablet (5 mg total) by mouth daily. 90 tablet 3  . atorvastatin (LIPITOR) 20 MG tablet TAKE 1 TABLET BY MOUTH EVERY DAY 90 tablet 0  . hydrochlorothiazide (HYDRODIURIL) 25 MG tablet Take 1 tablet (25 mg total) by mouth daily. MUST MAKE APPT FOR FURTHER REFILLS 30 tablet 0  . Potassium Chloride CR (MICRO-K) 8 MEQ CPCR capsule CR Take 1 capsule (8 mEq total) by mouth daily. 90 capsule 1   No current facility-administered medications on file prior to visit.    Allergies  Allergen Reactions  . Lisinopril Swelling    Lip swelling    Social History   Socioeconomic History  . Marital status: Single    Spouse name: Not on file  . Number of children: 4  . Years  of education: Not on file  . Highest education level: Not on file  Occupational History  . Occupation: [employer]  Tobacco Use  . Smoking status: Current Every Day Smoker    Packs/day: 0.50    Years: 15.00    Pack years: 7.50    Types: Cigarettes  . Smokeless tobacco: Never Used  . Tobacco comment: pt trying to quit down to 3 -4 cig's daily from 1ppd  Substance and Sexual Activity  . Alcohol use: Yes    Comment:  occasionally  . Drug use: No  . Sexual activity: Yes    Birth control/protection: Surgical  Other Topics Concern  . Not on file  Social History Narrative   [employer]- works from home.     3 children- (70 year old adopted child) 66, 4 and 1.    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 Difficulty of Paying Living Expenses: Not on file  Food Insecurity:   . Worried About Charity fundraiser in the Last Year: Not on file  . Ran Out of Food in the Last Year: Not on file  Transportation Needs:   . Lack of Transportation (Medical): Not on file  . Lack of Transportation (Non-Medical): Not on file  Physical Activity:   . Days of Exercise per Week: Not on file  . Minutes of Exercise per Session: Not on file  Stress:   . Feeling of Stress : Not on file  Social Connections:   . Frequency of Communication with Friends and Family: Not on file  . Frequency of Social Gatherings with Friends and Family: Not on file  . Attends Religious Services: Not on file  . Active Member of Clubs or Organizations: Not on file  . Attends Archivist Meetings: Not on file  . Marital Status: Not on file  Intimate Partner Violence:   . Fear of Current or Ex-Partner: Not on file  . Emotionally Abused: Not on file  . Physically Abused: Not on file  . Sexually Abused: Not on file    Family History  Problem Relation Age of Onset  . Cancer Mother        throat cancer  . Hypertension Mother   . Heart disease Father 2  . Stroke Father   . Cancer Maternal Aunt        breast cancer  . Cancer Maternal Uncle        prostate  . Diabetes Maternal Grandmother   . Cancer Maternal Uncle        throat cancer  . Cancer Maternal Aunt        breast cancer--in remision  . Kidney disease Neg Hx     Past Surgical History:  Procedure Laterality Date  . CESAREAN SECTION  02-04-2010;  11-30-2006;  07-12-2001  . DILITATION & CURRETTAGE/HYSTROSCOPY WITH HYDROTHERMAL ABLATION N/A 10/01/2016   Procedure: DILATATION & CURETTAGE/HYSTEROSCOPY WITH HYDROTHERMAL ABLATION;  Surgeon: Dian Queen, MD;  Location: [facility];  Service: Gynecology;  Laterality: N/A;  . LAPAROSCOPIC TUBAL LIGATION  Bilateral 03/02/2014   Procedure: BILATERAL LAPAROSCOPIC TUBAL LIGATION;  Surgeon: Cyril Mourning, MD;  Location: [facility];  Service: Gynecology;  Laterality: Bilateral;  . TUBAL LIGATION  2014  . WISDOM TOOTH EXTRACTION      ROS: Review of Systems  Constitutional: Negative for fever.  HENT: Negative for  congestion and sore throat.   Respiratory: Negative for cough, shortness of breath and wheezing.   Cardiovascular: Negative for chest pain, palpitations and leg swelling.  Gastrointestinal: Negative for nausea and vomiting.  Neurological: Positive for headaches. Negative for dizziness.  Negative except as stated above  PHYSICAL EXAM: Vitals with BMI 02/17/2019 03/24/2018 03/24/2018  Height 4\' 11"  - -  Weight 291 lbs - -  BMI 99991111 - -  Systolic [id number]  Diastolic 85 68 68  Pulse 96 - -   Temp (!) 97.1 F (36.2 C) (Oral)   Physical Exam General appearance - alert, well appearing, and in no distress, oriented to person, place, and time and overweight Mental status - alert, oriented to person, place, and time, normal mood, behavior, speech, dress, motor activity, and thought processes Chest - clear to auscultation, no wheezes, rales or rhonchi, symmetric air entry, no tachypnea, retractions or cyanosis Heart - normal rate, regular rhythm, normal S1, S2, no murmurs, rubs, clicks or gallops Neurological - alert, oriented, normal speech, no focal findings or movement disorder noted, cranial nerves II through XII intact, DTR's normal and symmetric, normal muscle tone, no tremors, strength 5/5  CMP Latest Ref Rng & Units 03/24/2018 02/16/2018 11/18/2017  Glucose 70 - 99 mg/dL - 91 83  BUN 6 - 20 mg/dL - 7 14  Creatinine 0.44 - 1.00 mg/dL - 0.87 0.68  Sodium 135 - 145 mmol/L - 138 143  Potassium 3.5 - 5.2 mmol/L 3.4(L) 3.4(L) 3.5  Chloride 98 - 111 mmol/L - 98 102  CO2 22 - 32 mmol/L - 27 22  Calcium 8.9 - 10.3 mg/dL - 9.1 9.0  Total Protein 6.0 - 8.5 g/dL - - 7.6    Total Bilirubin 0.0 - 1.2 mg/dL - - 0.3  Alkaline Phos 39 - 117 IU/L - - 63  AST 0 - 40 IU/L - - 13  ALT 0 - 32 IU/L - - 10   Lipid Panel     Component Value Date/Time   CHOL 229 (H) 02/18/2018 1223   TRIG 100 02/18/2018 1223   HDL 47 02/18/2018 1223   CHOLHDL 4.9 (H) 02/18/2018 1223   CHOLHDL 4.7 11/02/2011 1051   VLDL 16 11/02/2011 1051   LDLCALC 162 (H) 02/18/2018 1223    CBC    Component Value Date/Time   WBC 8.0 02/16/2018 1829   RBC 4.73 02/16/2018 1829   HGB 14.1 02/16/2018 1829   HGB 13.4 11/18/2017 1432   HCT 43.7 02/16/2018 1829   HCT 40.6 11/18/2017 1432   PLT 347 02/16/2018 1829   PLT 352 11/18/2017 1432   MCV 92.4 02/16/2018 1829   MCV 91 11/18/2017 1432   MCH 29.8 02/16/2018 1829   MCHC 32.3 02/16/2018 1829   RDW 12.9 02/16/2018 1829   RDW 13.1 11/18/2017 1432   LYMPHSABS 1.5 02/16/2018 1829   MONOABS 0.3 02/16/2018 1829   EOSABS 0.0 02/16/2018 1829   BASOSABS 0.0 02/16/2018 1829    ASSESSMENT AND PLAN: 1. Essential hypertension: - Comprehensive metabolic panel - amLODipine (NORVASC) 5 MG tablet; Take 1 tablet (5 mg total) by mouth daily.MUST MAKE APPT FOR FURTHER REFILLS  Dispense: 90 tablet; Refill: 0 - hydrochlorothiazide (HYDRODIURIL) 25 MG tablet; Take 1 tablet (25 mg total) by mouth daily. MUST MAKE APPT FOR FURTHER REFILLS  Dispense: 30 tablet; Refill: 2 -Counseled on blood pressure goal of less than 130/80, low-sodium, DASH diet, medication compliance, 150 minutes of moderate intensity exercise per week. Discussed medication compliance, adverse  effects.  2. Hyperlipidemia, unspecified hyperlipidemia type:  -Fasting lipid panel needed as soon as possible, please make appointment to return within 2 weeks - atorvastatin (LIPITOR) 20 MG tablet; Take 1 tablet (20 mg total) by mouth daily.  Dispense: 90 tablet; Refill: 0 -Patient was counseled on low-fat diet, medication compliance, 150 minutes of moderate intensity exercise per week. Discussed  medication compliance, adverse effects.  3. Hypokalemia:  - Comprehensive metabolic panel - Potassium Chloride CR (MICRO-K) 8 MEQ CPCR capsule CR; Take 1 capsule (8 mEq total) by mouth daily.  Dispense: 90 capsule; Refill: 0  4. Sleep disorder, unspecified:  -Follow good sleep hygiene -Sleep only long enough to feel rested and then get out of bed -Go to bed and get up at the same time everyday -Do not force yourself to sleep. If you can't sleep get out of bed and try again later. -Have coffee, tea, and other foods that have caffeine only in the morning -Avoid alcohol in the late afternoon, evening, and bedtime -Avoid smoking especially in the evening -Keep your bedroom dark, cool, quiet and free of objects that remind you of work and cause you stress -Solve problems you have before you go to bed -Avoid looking phones or reading devices ("e-books") that give off light before bed. This can make it harder to fall asleep -Relaxation therapy where you focus on relaxing all the muscles in your body 1 by 1  -Working with a counselor or psychologist to deal with the problems that may cause poor sleep   Patient was given the opportunity to ask questions. Patient verbalized understanding of the plan and was able to repeat key elements of the plan. Patient was given clear instructions to go to Emergency Department or return to medical center if symptoms don't improve, worsen, or new problems develop.The patient verbalized understanding. Return in about 3 months for follow-up with attending physician.   Requested Prescriptions    No prescriptions requested or ordered in this encounter   Keil Pickering Zachery Dauer, NP

## 2019-02-17 NOTE — Patient Instructions (Signed)
Return in about 2 weeks for fasting lipid panel lab. Return in about 3 months for follow-up with attending physician for management of chronic conditions.  Hypertension, Adult Hypertension is another name for high blood pressure. High blood pressure forces your heart to work harder to pump blood. This can cause problems over time. There are two numbers in a blood pressure reading. There is a top number (systolic) over a bottom number (diastolic). It is best to have a blood pressure that is below 120/80. Healthy choices can help lower your blood pressure, or you may need medicine to help lower it. What are the causes? The cause of this condition is not known. Some conditions may be related to high blood pressure. What increases the risk?  Smoking.  Having type 2 diabetes mellitus, high cholesterol, or both.  Not getting enough exercise or physical activity.  Being overweight.  Having too much fat, sugar, calories, or salt (sodium) in your diet.  Drinking too much alcohol.  Having long-term (chronic) kidney disease.  Having a family history of high blood pressure.  Age. Risk increases with age.  Race. You may be at higher risk if you are African American.  Gender. Men are at higher risk than women before age 70. After age 64, women are at higher risk than men.  Having obstructive sleep apnea.  Stress. What are the signs or symptoms?  High blood pressure may not cause symptoms. Very high blood pressure (hypertensive crisis) may cause: ? Headache. ? Feelings of worry or nervousness (anxiety). ? Shortness of breath. ? Nosebleed. ? A feeling of being sick to your stomach (nausea). ? Throwing up (vomiting). ? Changes in how you see. ? Very bad chest pain. ? Seizures. How is this treated?  This condition is treated by making healthy lifestyle changes, such as: ? Eating healthy foods. ? Exercising more. ? Drinking less alcohol.  Your health care provider may prescribe  medicine if lifestyle changes are not enough to get your blood pressure under control, and if: ? Your top number is above 130. ? Your bottom number is above 80.  Your personal target blood pressure may vary. Follow these instructions at home: Eating and drinking   If told, follow the DASH eating plan. To follow this plan: ? Fill one half of your plate at each meal with fruits and vegetables. ? Fill one fourth of your plate at each meal with whole grains. Whole grains include whole-wheat pasta, brown rice, and whole-grain bread. ? Eat or drink low-fat dairy products, such as skim milk or low-fat yogurt. ? Fill one fourth of your plate at each meal with low-fat (lean) proteins. Low-fat proteins include fish, chicken without skin, eggs, beans, and tofu. ? Avoid fatty meat, cured and processed meat, or chicken with skin. ? Avoid pre-made or processed food.  Eat less than 1,500 mg of salt each day.  Do not drink alcohol if: ? Your doctor tells you not to drink. ? You are pregnant, may be pregnant, or are planning to become pregnant.  If you drink alcohol: ? Limit how much you use to:  0-1 drink a day for women.  0-2 drinks a day for men. ? Be aware of how much alcohol is in your drink. In the [place] one drink equals one 12 oz bottle of beer (355 mL), one 5 oz glass of wine (148 mL), or one 1 oz glass of hard liquor (44 mL). Lifestyle   Work with your doctor to stay at  a healthy weight or to lose weight. Ask your doctor what the best weight is for you.  Get at least 30 minutes of exercise most days of the week. This may include walking, swimming, or biking.  Get at least 30 minutes of exercise that strengthens your muscles (resistance exercise) at least 3 days a week. This may include lifting weights or doing Pilates.  Do not use any products that contain nicotine or tobacco, such as cigarettes, e-cigarettes, and chewing tobacco. If you need help quitting, ask your doctor.  Check  your blood pressure at home as told by your doctor.  Keep all follow-up visits as told by your doctor. This is important. Medicines  Take over-the-counter and prescription medicines only as told by your doctor. Follow directions carefully.  Do not skip doses of blood pressure medicine. The medicine does not work as well if you skip doses. Skipping doses also puts you at risk for problems.  Ask your doctor about side effects or reactions to medicines that you should watch for. Contact a doctor if you:  Think you are having a reaction to the medicine you are taking.  Have headaches that keep coming back (recurring).  Feel dizzy.  Have swelling in your ankles.  Have trouble with your vision. Get help right away if you:  Get a very bad headache.  Start to feel mixed up (confused).  Feel weak or numb.  Feel faint.  Have very bad pain in your: ? Chest. ? Belly (abdomen).  Throw up more than once.  Have trouble breathing. Summary  Hypertension is another name for high blood pressure.  High blood pressure forces your heart to work harder to pump blood.  For most people, a normal blood pressure is less than 120/80.  Making healthy choices can help lower blood pressure. If your blood pressure does not get lower with healthy choices, you may need to take medicine. This information is not intended to replace advice given to you by your health care provider. Make sure you discuss any questions you have with your health care provider. Document Revised: 09/08/2017 Document Reviewed: 09/08/2017 Elsevier Patient Education  2020 [employer].

## 2019-02-17 NOTE — Progress Notes (Signed)
Pt. Is here requesting medication refill for her blood pressure, cholesterol, and potassium pills.   Pt. Would like to know if she can something to help her sleep.

## 2019-02-18 LAB — COMPREHENSIVE METABOLIC PANEL
ALT: 16 IU/L (ref 0–32)
AST: 19 IU/L (ref 0–40)
Albumin/Globulin Ratio: 1.2 (ref 1.2–2.2)
Albumin: 4.1 g/dL (ref 3.8–4.8)
Alkaline Phosphatase: 70 IU/L (ref 39–117)
BUN/Creatinine Ratio: 12 (ref 9–23)
BUN: 9 mg/dL (ref 6–24)
Bilirubin Total: <0.2 mg/dL (ref 0.0–1.2)
CO2: 25 mmol/L (ref 20–29)
Calcium: 8.8 mg/dL (ref 8.7–10.2)
Chloride: 100 mmol/L (ref 96–106)
Creatinine, Ser: 0.74 mg/dL (ref 0.57–1.00)
GFR calc Af Amer: 114 mL/min/{1.73_m2} (ref >59)
GFR calc non Af Amer: 99 mL/min/{1.73_m2} (ref >59)
Globulin, Total: 3.3 g/dL (ref 1.5–4.5)
Glucose: 73 mg/dL (ref 65–99)
Potassium: 3.4 mmol/L — ABNORMAL LOW (ref 3.5–5.2)
Sodium: 140 mmol/L (ref 134–144)
Total Protein: 7.4 g/dL (ref 6.0–8.5)

## 2019-02-20 NOTE — Progress Notes (Signed)
Potassium low but consistent with results over the last year. Continue potassium pills and eating foods high in potassium for added nutrients such bananas, oranges, spinach, and broccoli. Recheck in 3 months.

## 2019-02-21 ENCOUNTER — Telehealth: Payer: Self-pay

## 2019-02-21 NOTE — Telephone Encounter (Signed)
Contacted pt to go over lab results pt is aware and doesn't have any questions or concerns 

## 2019-05-30 ENCOUNTER — Encounter: Payer: Self-pay | Admitting: Internal Medicine

## 2019-05-30 ENCOUNTER — Other Ambulatory Visit: Payer: Self-pay | Admitting: Pharmacist

## 2019-05-30 DIAGNOSIS — I10 Essential (primary) hypertension: Secondary | ICD-10-CM

## 2019-05-30 MED ORDER — HYDROCHLOROTHIAZIDE 25 MG PO TABS: 25.0000 mg | ORAL_TABLET | Freq: Every day | ORAL | 2 refills | Status: DC

## 2019-05-30 MED ORDER — AMLODIPINE BESYLATE 5 MG PO TABS: 5.0000 mg | ORAL_TABLET | Freq: Every day | ORAL | 0 refills | Status: DC

## 2019-07-05 ENCOUNTER — Encounter: Payer: Self-pay | Admitting: Internal Medicine

## 2019-07-28 ENCOUNTER — Ambulatory Visit: Payer: 59 | Admitting: Internal Medicine

## 2019-08-07 ENCOUNTER — Other Ambulatory Visit: Payer: Self-pay | Admitting: Internal Medicine

## 2019-08-07 DIAGNOSIS — I10 Essential (primary) hypertension: Secondary | ICD-10-CM

## 2019-08-09 ENCOUNTER — Encounter: Payer: Self-pay | Admitting: Internal Medicine

## 2019-08-09 ENCOUNTER — Ambulatory Visit: Payer: No Typology Code available for payment source | Attending: Family Medicine | Admitting: Internal Medicine

## 2019-08-09 ENCOUNTER — Other Ambulatory Visit: Payer: Self-pay

## 2019-08-09 VITALS — BP 126/84 | HR 83 | Temp 97.5°F | Resp 17 | Wt 290.0 lb

## 2019-08-09 DIAGNOSIS — M25561 Pain in right knee: Secondary | ICD-10-CM | POA: Diagnosis not present

## 2019-08-09 DIAGNOSIS — E876 Hypokalemia: Secondary | ICD-10-CM

## 2019-08-09 DIAGNOSIS — E785 Hyperlipidemia, unspecified: Secondary | ICD-10-CM

## 2019-08-09 DIAGNOSIS — I10 Essential (primary) hypertension: Secondary | ICD-10-CM

## 2019-08-09 MED ORDER — POTASSIUM CHLORIDE ER 8 MEQ PO CPCR: 8.0000 meq | ORAL_CAPSULE | Freq: Every day | ORAL | 0 refills | Status: DC

## 2019-08-09 MED ORDER — NAPROXEN 500 MG PO TABS: 500.0000 mg | ORAL_TABLET | Freq: Two times a day (BID) | ORAL | 0 refills | Status: DC

## 2019-08-09 MED ORDER — ATORVASTATIN CALCIUM 20 MG PO TABS: 20.0000 mg | ORAL_TABLET | Freq: Every day | ORAL | 0 refills | Status: DC

## 2019-08-09 MED ORDER — HYDROCHLOROTHIAZIDE 25 MG PO TABS: 25.0000 mg | ORAL_TABLET | Freq: Every day | ORAL | 0 refills | Status: DC

## 2019-08-09 MED ORDER — AMLODIPINE BESYLATE 5 MG PO TABS: 5.0000 mg | ORAL_TABLET | Freq: Every day | ORAL | 0 refills | Status: DC

## 2019-08-09 NOTE — Progress Notes (Signed)
2.5 months of right knee pain. Some better 3/10 pain currently.  No known injury.  No known swelling  She has pain every day- improving.  No otc meds   BP Readings from Last 3 Encounters:  08/09/19 126/84  02/17/19 129/85  03/24/18 128/68      Says she had cellulitis of that leg several years ago.    Well-developed well-nourished female in no acute distress. HEENT exam atraumatic, normocephalic, extraocular muscles are intact. Neck is supple. No jugular venous distention no thyromegaly. Chest clear to auscultation without increased work of breathing. Cardiac exam S1 and S2 are regular. Abdominal exam active bowel sounds, soft, nontender. Extremities no edema. Neurologic exam she is alert without any motor sensory deficits. Gait is normal.  Obese- Has FROM both knees and knees are stable  Knee pain, right 2.5 months of knee pain Its improving Will try short course of NSAID.  If not better in 3 weeks than get xray (views with standing views)

## 2019-08-09 NOTE — Patient Instructions (Signed)
If knee pain not better in 2 weeks, please call back and we will schedule an xray.

## 2019-08-09 NOTE — Assessment & Plan Note (Signed)
2.5 months of knee pain Its improving Will try short course of NSAID.  If not better in 3 weeks than get xray (views with standing views)

## 2019-10-31 ENCOUNTER — Other Ambulatory Visit (HOSPITAL_COMMUNITY)
Admission: RE | Admit: 2019-10-31 | Discharge: 2019-10-31 | Disposition: A | Discharge status: Home / Self Care | Payer: No Typology Code available for payment source | Source: Ambulatory Visit | Attending: Internal Medicine | Admitting: Internal Medicine

## 2019-10-31 ENCOUNTER — Ambulatory Visit (HOSPITAL_BASED_OUTPATIENT_CLINIC_OR_DEPARTMENT_OTHER): Payer: No Typology Code available for payment source | Admitting: Pharmacist

## 2019-10-31 ENCOUNTER — Other Ambulatory Visit: Payer: Self-pay | Admitting: Family Medicine

## 2019-10-31 ENCOUNTER — Encounter: Payer: Self-pay | Admitting: Internal Medicine

## 2019-10-31 ENCOUNTER — Ambulatory Visit: Payer: No Typology Code available for payment source | Attending: Internal Medicine | Admitting: Internal Medicine

## 2019-10-31 ENCOUNTER — Other Ambulatory Visit: Payer: Self-pay

## 2019-10-31 VITALS — BP 115/81 | HR 104 | Resp 16 | Wt 275.6 lb

## 2019-10-31 DIAGNOSIS — Z23 Encounter for immunization: Secondary | ICD-10-CM | POA: Diagnosis not present

## 2019-10-31 DIAGNOSIS — Z114 Encounter for screening for human immunodeficiency virus [HIV]: Secondary | ICD-10-CM

## 2019-10-31 DIAGNOSIS — I10 Essential (primary) hypertension: Secondary | ICD-10-CM | POA: Diagnosis not present

## 2019-10-31 DIAGNOSIS — N760 Acute vaginitis: Secondary | ICD-10-CM | POA: Diagnosis not present

## 2019-10-31 DIAGNOSIS — E785 Hyperlipidemia, unspecified: Secondary | ICD-10-CM

## 2019-10-31 DIAGNOSIS — F17200 Nicotine dependence, unspecified, uncomplicated: Secondary | ICD-10-CM

## 2019-10-31 DIAGNOSIS — B373 Candidiasis of vulva and vagina: Secondary | ICD-10-CM | POA: Diagnosis not present

## 2019-10-31 DIAGNOSIS — Z113 Encounter for screening for infections with a predominantly sexual mode of transmission: Secondary | ICD-10-CM | POA: Diagnosis present

## 2019-10-31 DIAGNOSIS — B9689 Other specified bacterial agents as the cause of diseases classified elsewhere: Secondary | ICD-10-CM | POA: Diagnosis not present

## 2019-10-31 DIAGNOSIS — Z1159 Encounter for screening for other viral diseases: Secondary | ICD-10-CM

## 2019-10-31 MED ORDER — BUPROPION HCL ER (SR) 150 MG PO TB12: ORAL_TABLET | ORAL | 2 refills | Status: DC

## 2019-10-31 NOTE — Telephone Encounter (Signed)
[facility] Kitchen Requested Prescriptions  Pending Prescriptions Disp Refills   amLODipine (NORVASC) 5 MG tablet [Pharmacy Med Name: AMLODIPINE BESYLATE 5 MG TAB] 90 tablet 1    Sig: TAKE 1 TABLET BY MOUTH EVERY DAY     Cardiovascular:  Calcium Channel Blockers Passed - 10/31/2019  1:39 AM      Passed - Last BP in normal range    BP Readings from Last 1 Encounters:  08/09/19 126/84         Passed - Valid encounter within last 6 months    Recent Outpatient Visits          2 months ago Acute pain of right knee   [place], Darrick Penna, MD   8 months ago Essential hypertension   [place], [place] J, NP   1 year ago Essential hypertension   [place], Deborah B, MD   1 year ago Essential hypertension   [place], Vernia Buff, NP   1 year ago Essential hypertension   [place], MD      Future Appointments            Today Ladell Pier, MD [place]            atorvastatin (LIPITOR) 20 MG tablet [Pharmacy Med Name: ATORVASTATIN 20 MG TABLET] 90 tablet 0    Sig: TAKE 1 TABLET BY MOUTH EVERY DAY     Cardiovascular:  Antilipid - Statins Failed - 10/31/2019  1:39 AM      Failed - Total Cholesterol in normal range and within 360 days    Cholesterol, Total  Date Value Ref Range Status  02/18/2018 229 (H) 100 - 199 mg/dL Final         Failed - LDL in normal range and within 360 days    LDL Calculated  Date Value Ref Range Status  02/18/2018 162 (H) 0 - 99 mg/dL Final         Failed - HDL in normal range and within 360 days    HDL  Date Value Ref Range Status  02/18/2018 47 >39 mg/dL Final         Failed - Triglycerides in normal range and within 360 days    Triglycerides  Date Value Ref Range Status  02/18/2018 100 0 - 149 mg/dL Final          Passed - Patient is not pregnant      Passed - Valid encounter within last 12 months    Recent Outpatient Visits          2 months ago Acute pain of right knee   [place], Darrick Penna, MD   8 months ago Essential hypertension   [place], [place], NP   1 year ago Essential hypertension   [place], Deborah B, MD   1 year ago Essential hypertension   [place], Vernia Buff, NP   1 year ago Essential hypertension   [place], Deborah B, MD      Future Appointments            Today Ladell Pier, MD [place]

## 2019-10-31 NOTE — Progress Notes (Signed)
Patient ID: Diane [name], female    DOB: Dec 12, 1975  MRN: [id number]  CC: std testing   Subjective: Diane [name] is a 44 y.o. female who presents for STD testing Her concerns today include:  Patient with history of tob dep, morbid obesity, HTN, HL, CTS and anxiety  Pt wants STD testing including HIV. C/o having brown vaginal discgh x few days.  No vaginal itching.  Sexually active with one female partner.  HTN:  Compliant with meds and salt restriction No CP/SOB/LE edema  Obesity:  Wgh down 15 lbs from 07/2019. On Phentermine through her GYN since August to help suppress appetite.    -Baking and air frying more. However her son lives with her and loves fried foods.  Having to eat later at nights due to time she gets done with work in the evenings -drinking more water Still not interested in referral to nutritionist -she dances with her kids 2 x a wk for exercise  Tob dep:  "my kids get on me every day."  Reports she plans to slow down after her BD later this mth with plan to quit by end Dec 2021.  Did not tolerate Chantix.  Tried Wellbutrin in the past but did not stick with it.  Would like to try again.   HL:  Compliant with Lipitor  Patient Active Problem List   Diagnosis Date Noted  . Knee pain, right 08/09/2019  . Hyperlipidemia 03/24/2018  . Essential hypertension 11/18/2017  . Carpal tunnel syndrome of left wrist 11/18/2017  . Anxiety 06/27/2012  . Preventative health care 12/08/2011  . Tobacco abuse 11/02/2011  . Morbid obesity ([place]) 11/02/2011     Current Outpatient Medications on File Prior to Visit  Medication Sig Dispense Refill  . ALPRAZolam (XANAX) 0.5 MG tablet Take 0.5 mg by mouth 3 (three) times daily as needed.    Marland Kitchen atorvastatin (LIPITOR) 20 MG tablet Take 1 tablet (20 mg total) by mouth daily. 90 tablet 0  . hydrochlorothiazide (HYDRODIURIL) 25 MG tablet Take 1 tablet (25 mg total) by mouth daily. 90 tablet 0  . naproxen (NAPROSYN) 500 MG  tablet Take 1 tablet (500 mg total) by mouth 2 (two) times daily with a meal. 30 tablet 0  . phentermine (ADIPEX-P) 37.5 MG tablet Take 1 tablet by mouth daily.    . Potassium Chloride CR (MICRO-K) 8 MEQ CPCR capsule CR Take 1 capsule (8 mEq total) by mouth daily. 90 capsule 0   No current facility-administered medications on file prior to visit.    Allergies  Allergen Reactions  . Lisinopril Swelling    Lip swelling    Social History   Socioeconomic History  . Marital status: Single    Spouse name: Not on file  . Number of children: 4  . Years of education: Not on file  . Highest education level: Not on file  Occupational History  . Occupation: [employer]  Tobacco Use  . Smoking status: Current Every Day Smoker    Packs/day: 0.50    Years: 15.00    Pack years: 7.50    Types: Cigarettes  . Smokeless tobacco: Never Used  . Tobacco comment: pt trying to quit down to 3 -4 cig's daily from 1ppd  Substance and Sexual Activity  . Alcohol use: Yes    Comment:  occasionally  . Drug use: No  . Sexual activity: Yes    Birth control/protection: Surgical  Other Topics Concern  . Not on file  Social History  Narrative   [employer]- works from home.     3 children- (74 year old adopted child) 27, 4 and 1.    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 Difficulty of Paying Living Expenses: Not on file  Food Insecurity:   . Worried About Charity fundraiser in the Last Year: Not on file  . Ran Out of Food in the Last Year: Not on file  Transportation Needs:   . Lack of Transportation (Medical): Not on file  . Lack of Transportation (Non-Medical): Not on file  Physical Activity:   . Days of Exercise per Week: Not on file  . Minutes of Exercise per Session: Not on file  Stress:   . Feeling of Stress : Not on file  Social Connections:   . Frequency of Communication with  Friends and Family: Not on file  . Frequency of Social Gatherings with Friends and Family: Not on file  . Attends Religious Services: Not on file  . Active Member of Clubs or Organizations: Not on file  . Attends Archivist Meetings: Not on file  . Marital Status: Not on file  Intimate Partner Violence:   . Fear of Current or Ex-Partner: Not on file  . Emotionally Abused: Not on file  . Physically Abused: Not on file  . Sexually Abused: Not on file    Family History  Problem Relation Age of Onset  . Cancer Mother        throat cancer  . Hypertension Mother   . Heart disease Father 27  . Stroke Father   . Cancer Maternal Aunt        breast cancer  . Cancer Maternal Uncle        prostate  . Diabetes Maternal Grandmother   . Cancer Maternal Uncle        throat cancer  . Cancer Maternal Aunt        breast cancer--in remision  . Kidney disease Neg Hx     Past Surgical History:  Procedure Laterality Date  . CESAREAN SECTION  02-04-2010;  11-30-2006;  07-12-2001  . DILITATION & CURRETTAGE/HYSTROSCOPY WITH HYDROTHERMAL ABLATION N/A 10/01/2016   Procedure: DILATATION & CURETTAGE/HYSTEROSCOPY WITH HYDROTHERMAL ABLATION;  Surgeon: Dian Queen, MD;  Location: [facility];  Service: Gynecology;  Laterality: N/A;  . LAPAROSCOPIC TUBAL LIGATION Bilateral 03/02/2014   Procedure: BILATERAL LAPAROSCOPIC TUBAL LIGATION;  Surgeon: Cyril Mourning, MD;  Location: [facility];  Service: Gynecology;  Laterality: Bilateral;  . TUBAL LIGATION  2014  . WISDOM TOOTH EXTRACTION      ROS: Review of Systems Negative except as stated above  PHYSICAL EXAM: BP 115/81   Pulse (!) 104   Resp 16   Wt 275 lb 9.6 oz (125 kg)   SpO2 (!) 9%   BMI 55.66 kg/m   Wt Readings from Last 3 Encounters:  10/31/19 275 lb 9.6 oz (125 kg)  08/09/19 (!) 290 lb (131.5 kg)  02/17/19 291 lb (132 kg)    Physical Exam  General appearance - alert, well appearing, morbidly obese  African-American female and in no distress Mental status - normal mood, behavior, speech, dress, motor activity, and thought processes Neck - supple, no significant adenopathy Chest - clear to auscultation, no wheezes, rales or rhonchi, symmetric air entry Heart - normal rate, regular rhythm, normal S1, S2,  no murmurs, rubs, clicks or gallops Extremities - peripheral pulses normal, no pedal edema, no clubbing or cyanosis  CMP Latest Ref Rng & Units 02/17/2019 03/24/2018 02/16/2018  Glucose 65 - 99 mg/dL 73 - 91  BUN 6 - 24 mg/dL 9 - 7  Creatinine 0.57 - 1.00 mg/dL 0.74 - 0.87  Sodium 134 - 144 mmol/L 140 - 138  Potassium 3.5 - 5.2 mmol/L 3.4(L) 3.4(L) 3.4(L)  Chloride 96 - 106 mmol/L 100 - 98  CO2 20 - 29 mmol/L 25 - 27  Calcium 8.7 - 10.2 mg/dL 8.8 - 9.1  Total Protein 6.0 - 8.5 g/dL 7.4 - -  Total Bilirubin 0.0 - 1.2 mg/dL <0.2 - -  Alkaline Phos 39 - 117 IU/L 70 - -  AST 0 - 40 IU/L 19 - -  ALT 0 - 32 IU/L 16 - -   Lipid Panel     Component Value Date/Time   CHOL 229 (H) 02/18/2018 1223   TRIG 100 02/18/2018 1223   HDL 47 02/18/2018 1223   CHOLHDL 4.9 (H) 02/18/2018 1223   CHOLHDL 4.7 11/02/2011 1051   VLDL 16 11/02/2011 1051   LDLCALC 162 (H) 02/18/2018 1223    CBC    Component Value Date/Time   WBC 8.0 02/16/2018 1829   RBC 4.73 02/16/2018 1829   HGB 14.1 02/16/2018 1829   HGB 13.4 11/18/2017 1432   HCT 43.7 02/16/2018 1829   HCT 40.6 11/18/2017 1432   PLT 347 02/16/2018 1829   PLT 352 11/18/2017 1432   MCV 92.4 02/16/2018 1829   MCV 91 11/18/2017 1432   MCH 29.8 02/16/2018 1829   MCHC 32.3 02/16/2018 1829   RDW 12.9 02/16/2018 1829   RDW 13.1 11/18/2017 1432   LYMPHSABS 1.5 02/16/2018 1829   MONOABS 0.3 02/16/2018 1829   EOSABS 0.0 02/16/2018 1829   BASOSABS 0.0 02/16/2018 1829    ASSESSMENT AND PLAN:  1. Screen for STD (sexually transmitted disease) - Cervicovaginal ancillary only - RPR  2. Morbid obesity ([place]) Commended her on weight loss so far.   Encouraged her to set a weight goal.  Dietary counseling given.  Encouraged her to exercise at least 3 to 4 days a week for 30 to 45 minutes.  3. Essential hypertension At goal.  Continue current medications and low-salt diet. - CBC  4. Hyperlipidemia, unspecified hyperlipidemia type Continue Lipitor. - Lipid panel  5. Tobacco dependence Advised to quit.  Discussed health risks associated with smoking.  Patient wants to try bupropion again.  Prescription written.  Less than 5 minutes spent on counseling. - buPROPion (WELLBUTRIN SR) 150 MG 12 hr tablet; 1 tab PO daily x 7 days then 1 tab PO BID  Dispense: 60 tablet; Refill: 2  6. Need for hepatitis C screening test Patient agreeable to screening.  7. Screening for HIV (human immunodeficiency virus) Agreeable to screening. - HIV Antibody (routine testing w rflx)  8. Need for influenza vaccination Given today. - Hepatitis C Antibody   Patient was given the opportunity to ask questions.  Patient verbalized understanding of the plan and was able to repeat key elements of the plan.   No orders of the defined types were placed in this encounter.    Requested Prescriptions    No prescriptions requested or ordered in this encounter    No follow-ups on file.  Karle Plumber, MD, FACP

## 2019-10-31 NOTE — Patient Instructions (Addendum)
I have sent the prescription to your pharmacy for Bupropion to help with smoking cessation.  Obesity, Adult Obesity is having too much body fat. Being obese means that your weight is more than what is healthy for you. BMI is a number that explains how much body fat you have. If you have a BMI of 30 or more, you are obese. Obesity is often caused by eating or drinking more calories than your body uses. Changing your lifestyle can help you lose weight. Obesity can cause serious health problems, such as:  Stroke.  Coronary artery disease (CAD).  Type 2 diabetes.  Some types of cancer, including cancers of the colon, breast, uterus, and gallbladder.  Osteoarthritis.  High blood pressure (hypertension).  High cholesterol.  Sleep apnea.  Gallbladder stones.  Infertility problems. What are the causes?  Eating meals each day that are high in calories, sugar, and fat.  Being born with genes that may make you more likely to become obese.  Having a medical condition that causes obesity.  Taking certain medicines.  Sitting a lot (having a sedentary lifestyle).  Not getting enough sleep.  Drinking a lot of drinks that have sugar in them. What increases the risk?  Having a family history of obesity.  Being an [place] American woman.  Being a Hispanic man.  Living in an area with limited access to: ? Romilda Garret, recreation centers, or sidewalks. ? Healthy food choices, such as grocery stores and farmers' markets. What are the signs or symptoms? The main sign is having too much body fat. How is this treated?  Treatment for this condition often includes changing your lifestyle. Treatment may include: ? Changing your diet. This may include making a healthy meal plan. ? Exercise. This may include activity that causes your heart to beat faster (aerobic exercise) and strength training. Work with your doctor to design a program that works for you. ? Medicine to help you lose weight.  This may be used if you are not able to lose 1 pound a week after 6 weeks of healthy eating and more exercise. ? Treating conditions that cause the obesity. ? Surgery. Options may include gastric banding and gastric bypass. This may be done if:  Other treatments have not helped to improve your condition.  You have a BMI of 40 or higher.  You have life-threatening health problems related to obesity. Follow these instructions at home: Eating and drinking   Follow advice from your doctor about what to eat and drink. Your doctor may tell you to: ? Limit fast food, sweets, and processed snack foods. ? Choose low-fat options. For example, choose low-fat milk instead of whole milk. ? Eat 5 or more servings of fruits or vegetables each day. ? Eat at home more often. This gives you more control over what you eat. ? Choose healthy foods when you eat out. ? Learn to read food labels. This will help you learn how much food is in 1 serving. ? Keep low-fat snacks available. ? Avoid drinks that have a lot of sugar in them. These include soda, fruit juice, iced tea with sugar, and flavored milk.  Drink enough water to keep your pee (urine) pale yellow.  Do not go on fad diets. Physical activity  Exercise often, as told by your doctor. Most adults should get up to 150 minutes of moderate-intensity exercise every week.Ask your doctor: ? What types of exercise are safe for you. ? How often you should exercise.  Warm up and  stretch before being active.  Do slow stretching after being active (cool down).  Rest between times of being active. Lifestyle  Work with your doctor and a food expert (dietitian) to set a weight-loss goal that is best for you.  Limit your screen time.  Find ways to reward yourself that do not involve food.  Do not drink alcohol if: ? Your doctor tells you not to drink. ? You are pregnant, may be pregnant, or are planning to become pregnant.  If you drink  alcohol: ? Limit how much you use to:  0-1 drink a day for women.  0-2 drinks a day for men. ? Be aware of how much alcohol is in your drink. In the [place] one drink equals one 12 oz bottle of beer (355 mL), one 5 oz glass of wine (148 mL), or one 1 oz glass of hard liquor (44 mL). General instructions  Keep a weight-loss journal. This can help you keep track of: ? The food that you eat. ? How much exercise you get.  Take over-the-counter and prescription medicines only as told by your doctor.  Take vitamins and supplements only as told by your doctor.  Think about joining a support group.  Keep all follow-up visits as told by your doctor. This is important. Contact a doctor if:  You cannot meet your weight loss goal after you have changed your diet and lifestyle for 6 weeks. Get help right away if you:  Are having trouble breathing.  Are having thoughts of harming yourself. Summary  Obesity is having too much body fat.  Being obese means that your weight is more than what is healthy for you.  Work with your doctor to set a weight-loss goal.  Get regular exercise as told by your doctor. This information is not intended to replace advice given to you by your health care provider. Make sure you discuss any questions you have with your health care provider. Document Revised: 09/02/2017 Document Reviewed: 09/02/2017 Elsevier Patient Education  [place].   Influenza Virus Vaccine injection (Fluarix) What is this medicine? INFLUENZA VIRUS VACCINE (in floo EN zuh VAHY ruhs vak SEEN) helps to reduce the risk of getting influenza also known as the flu. This medicine may be used for other purposes; ask your health care provider or pharmacist if you have questions. COMMON BRAND NAME(S): Fluarix, Fluzone What should I tell my health care provider before I take this medicine? They need to know if you have any of these conditions:  bleeding disorder like  hemophilia  fever or infection  Guillain-Barre syndrome or other neurological problems  immune system problems  infection with the human immunodeficiency virus (HIV) or AIDS  low blood platelet counts  multiple sclerosis  an unusual or allergic reaction to influenza virus vaccine, eggs, chicken proteins, latex, gentamicin, other medicines, foods, dyes or preservatives  pregnant or trying to get pregnant  breast-feeding How should I use this medicine? This vaccine is for injection into a muscle. It is given by a health care professional. A copy of Vaccine Information Statements will be given before each vaccination. Read this sheet carefully each time. The sheet may change frequently. Talk to your pediatrician regarding the use of this medicine in children. Special care may be needed. Overdosage: If you think you have taken too much of this medicine contact a poison control center or emergency room at once. NOTE: This medicine is only for you. Do not share this medicine with others. What  if I miss a dose? This does not apply. What may interact with this medicine?  chemotherapy or radiation therapy  medicines that lower your immune system like etanercept, anakinra, infliximab, and adalimumab  medicines that treat or prevent blood clots like warfarin  phenytoin  steroid medicines like prednisone or cortisone  theophylline  vaccines This list may not describe all possible interactions. Give your health care provider a list of all the medicines, herbs, non-prescription drugs, or dietary supplements you use. Also tell them if you smoke, drink alcohol, or use illegal drugs. Some items may interact with your medicine. What should I watch for while using this medicine? Report any side effects that do not go away within 3 days to your doctor or health care professional. Call your health care provider if any unusual symptoms occur within 6 weeks of receiving this vaccine. You may  still catch the flu, but the illness is not usually as bad. You cannot get the flu from the vaccine. The vaccine will not protect against colds or other illnesses that may cause fever. The vaccine is needed every year. What side effects may I notice from receiving this medicine? Side effects that you should report to your doctor or health care professional as soon as possible:  allergic reactions like skin rash, itching or hives, swelling of the face, lips, or tongue Side effects that usually do not require medical attention (report to your doctor or health care professional if they continue or are bothersome):  fever  headache  muscle aches and pains  pain, tenderness, redness, or swelling at site where injected  weak or tired This list may not describe all possible side effects. Call your doctor for medical advice about side effects. You may report side effects to FDA at 1-800-FDA-1088. Where should I keep my medicine? This vaccine is only given in a clinic, pharmacy, doctor's office, or other health care setting and will not be stored at home. NOTE: This sheet is a summary. It may not cover all possible information. If you have questions about this medicine, talk to your doctor, pharmacist, or health care provider.  2020 Elsevier/Gold Standard (2007-07-27 09:30:40)

## 2019-10-31 NOTE — Telephone Encounter (Signed)
Requested medications are due for refill today?  Yes  Requested medications are on active medication list?  Yes  Last Refill:   08/09/2019  # 90 with no refills   Future visit scheduled?  Yes today   Notes to Clinic:  Medication failed Rx refill protocol due to no labs within the past 360 days.  Last labs were performed on 02/18/2018.

## 2019-10-31 NOTE — Progress Notes (Signed)
Patient presents for vaccination against influenza per orders of Dr. Johnson. Consent given. Counseling provided. No contraindications exists. Vaccine administered without incident.  ° °Luke Van Ausdall, PharmD, CPP °Clinical Pharmacist °[facility] °[phone] ° °

## 2019-11-01 ENCOUNTER — Encounter: Payer: Self-pay | Admitting: Internal Medicine

## 2019-11-01 LAB — LIPID PANEL
Chol/HDL Ratio: 3.7 ratio (ref 0.0–4.4)
Cholesterol, Total: 150 mg/dL (ref 100–199)
HDL: 41 mg/dL (ref >39)
LDL Chol Calc (NIH): 94 mg/dL (ref 0–99)
Triglycerides: 79 mg/dL (ref 0–149)
VLDL Cholesterol Cal: 15 mg/dL (ref 5–40)

## 2019-11-01 LAB — CERVICOVAGINAL ANCILLARY ONLY
Bacterial Vaginitis (gardnerella): POSITIVE — AB
Candida Glabrata: NEGATIVE
Candida Vaginitis: POSITIVE — AB
Chlamydia: NEGATIVE
Comment: NEGATIVE
Comment: NEGATIVE
Comment: NEGATIVE
Comment: NEGATIVE
Comment: NEGATIVE
Comment: NORMAL
Neisseria Gonorrhea: NEGATIVE
Trichomonas: NEGATIVE

## 2019-11-01 LAB — CBC
Hematocrit: 44.2 % (ref 34.0–46.6)
Hemoglobin: 14.5 g/dL (ref 11.1–15.9)
MCH: 30.3 pg (ref 26.6–33.0)
MCHC: 32.8 g/dL (ref 31.5–35.7)
MCV: 92 fL (ref 79–97)
Platelets: 321 10*3/uL (ref 150–450)
RBC: 4.79 x10E6/uL (ref 3.77–5.28)
RDW: 13.3 % (ref 11.7–15.4)
WBC: 6.5 10*3/uL (ref 3.4–10.8)

## 2019-11-01 LAB — HEPATITIS C ANTIBODY: Hep C Virus Ab: <0.1 s/co ratio (ref 0.0–0.9)

## 2019-11-01 LAB — RPR: RPR Ser Ql: NONREACTIVE

## 2019-11-01 LAB — HIV ANTIBODY (ROUTINE TESTING W REFLEX): HIV Screen 4th Generation wRfx: NONREACTIVE

## 2019-11-02 ENCOUNTER — Other Ambulatory Visit: Payer: Self-pay | Admitting: Internal Medicine

## 2019-11-02 MED ORDER — FLUCONAZOLE 150 MG PO TABS: 150.0000 mg | ORAL_TABLET | Freq: Once | ORAL | 0 refills | Status: AC

## 2019-11-02 MED ORDER — METRONIDAZOLE 500 MG PO TABS: 500.0000 mg | ORAL_TABLET | Freq: Two times a day (BID) | ORAL | 0 refills | Status: DC

## 2019-11-10 ENCOUNTER — Other Ambulatory Visit: Payer: Self-pay | Admitting: Family Medicine

## 2019-11-10 DIAGNOSIS — E876 Hypokalemia: Secondary | ICD-10-CM

## 2019-11-23 ENCOUNTER — Other Ambulatory Visit: Payer: Self-pay | Admitting: Internal Medicine

## 2019-11-23 DIAGNOSIS — F17200 Nicotine dependence, unspecified, uncomplicated: Secondary | ICD-10-CM

## 2019-11-23 NOTE — Telephone Encounter (Signed)
  Notes to clinic:  Patient requesting a 90 day supply Review for change   Requested Prescriptions  Pending Prescriptions Disp Refills   buPROPion (WELLBUTRIN SR) 150 MG 12 hr tablet [Pharmacy Med Name: BUPROPION HCL SR 150 MG TABLET] 180 tablet 1    Sig: TAKE 1 TABLET BY MOUTH EVERY DAY FOR 7 DAYS THEN TWICE DAILY      Psychiatry: Antidepressants - bupropion Passed - 11/23/2019  2:35 PM      Passed - Last BP in normal range    BP Readings from Last 1 Encounters:  10/31/19 115/81          Passed - Valid encounter within last 6 months    Recent Outpatient Visits           3 weeks ago Need for influenza vaccination   [place], RPH-CPP   3 weeks ago Screen for STD (sexually transmitted disease)   [place], MD   3 months ago Acute pain of right knee   [place], Darrick Penna, MD   9 months ago Essential hypertension   [place], [place], NP   1 year ago Essential hypertension   [place], MD       Future Appointments             In 3 months Wynetta Emery, Dalbert Batman, MD [place]

## 2019-12-05 ENCOUNTER — Other Ambulatory Visit: Payer: Self-pay | Admitting: Family Medicine

## 2019-12-05 DIAGNOSIS — I10 Essential (primary) hypertension: Secondary | ICD-10-CM

## 2020-01-14 ENCOUNTER — Other Ambulatory Visit: Payer: Self-pay | Admitting: Family Medicine

## 2020-01-14 DIAGNOSIS — I10 Essential (primary) hypertension: Secondary | ICD-10-CM

## 2020-01-29 ENCOUNTER — Encounter: Payer: Self-pay | Admitting: Internal Medicine

## 2020-01-31 MED ORDER — METRONIDAZOLE 500 MG PO TABS
500.0000 mg | ORAL_TABLET | Freq: Two times a day (BID) | ORAL | 0 refills | Status: DC
Start: 2020-01-31 — End: 2020-03-04

## 2020-03-04 ENCOUNTER — Ambulatory Visit: Payer: No Typology Code available for payment source | Attending: Internal Medicine | Admitting: Internal Medicine

## 2020-03-04 ENCOUNTER — Other Ambulatory Visit: Payer: Self-pay

## 2020-03-04 DIAGNOSIS — F17200 Nicotine dependence, unspecified, uncomplicated: Secondary | ICD-10-CM

## 2020-03-04 DIAGNOSIS — I10 Essential (primary) hypertension: Secondary | ICD-10-CM | POA: Diagnosis not present

## 2020-03-04 DIAGNOSIS — E785 Hyperlipidemia, unspecified: Secondary | ICD-10-CM | POA: Diagnosis not present

## 2020-03-04 DIAGNOSIS — Z1211 Encounter for screening for malignant neoplasm of colon: Secondary | ICD-10-CM

## 2020-03-04 MED ORDER — ATORVASTATIN CALCIUM 20 MG PO TABS: 20.0000 mg | ORAL_TABLET | Freq: Every day | ORAL | 1 refills | Status: DC

## 2020-03-04 MED ORDER — HYDROCHLOROTHIAZIDE 25 MG PO TABS: 25.0000 mg | ORAL_TABLET | Freq: Every day | ORAL | 1 refills | Status: DC

## 2020-03-04 NOTE — Progress Notes (Signed)
Virtual Visit via Telephone Note  I connected with Diane [name] on 03/04/20 at 3:42 p.m by telephone and verified that I am speaking with the correct person using two identifiers.  Location: Patient: home  Provider: office The patient, my CMA Ms. Diane [name] and myself participated in this visit. I discussed the limitations, risks, security and privacy concerns of performing an evaluation and management service by telephone and the availability of in person appointments. I also discussed with the patient that there may be a patient responsible charge related to this service. The patient expressed understanding and agreed to proceed.   History of Present Illness: Patient with history of tob dep, morbid obesity, HTN, HL, CTSand anxiety  Tob dep:  On last visit, her plan was to quit end December.  She has slowed down to 4 cig/day from 1/2 pk/day but has not quit completely.  Still plans to quit.  States that her children really get on her about smoking and she is trying her best to quit..  Just started the Wellbutrin 1 wk ago  Obesity: still on Phentermine through her gyn. It really helps in curbing appetite.  Down 1 pants size; no scale at home to wgh self.  She tries to move as much as she can.   HTN: compliant with Norvasc and HCTZ.  Not taking Potassium.  Does not like taking it.  No device to check BP.  No CP/SOB.  Blood pressure has been good on visits.  HL: Taking and tolerating atorvastatin  HM: due for colon CA screen.  Has a maternal aunt and uncle with colon CA. Uncle dx in his 44s and aunt in her 3's  Outpatient Encounter Medications as of 03/04/2020  Medication Sig  . ALPRAZolam (XANAX) 0.5 MG tablet Take 0.5 mg by mouth 3 (three) times daily as needed.  Marland Kitchen amLODipine (NORVASC) 5 MG tablet TAKE 1 TABLET BY MOUTH EVERY DAY  . atorvastatin (LIPITOR) 20 MG tablet TAKE 1 TABLET BY MOUTH EVERY DAY  . buPROPion (WELLBUTRIN SR) 150 MG 12 hr tablet Take 1 tablet (150 mg total) by  mouth 2 (two) times daily. TAKE 1 TABLET BY MOUTH EVERY DAY FOR 7 DAYS THEN TWICE DAILY  . hydrochlorothiazide (HYDRODIURIL) 25 MG tablet TAKE 1 TABLET BY MOUTH EVERY DAY  . phentermine (ADIPEX-P) 37.5 MG tablet Take 1 tablet by mouth daily.  . metroNIDAZOLE (FLAGYL) 500 MG tablet Take 1 tablet (500 mg total) by mouth 2 (two) times daily. (Patient not taking: Reported on 03/04/2020)  . naproxen (NAPROSYN) 500 MG tablet TAKE 1 TABLET (500 MG TOTAL) BY MOUTH 2 (TWO) TIMES DAILY WITH A MEAL. (Patient not taking: Reported on 03/04/2020)  . Potassium Chloride CR (MICRO-K) 8 MEQ CPCR capsule CR TAKE 1 CAPSULE (8 MEQ TOTAL) BY MOUTH DAILY. (Patient not taking: Reported on 03/04/2020)   No facility-administered encounter medications on file as of 03/04/2020.      Observations/Objective: Results for orders placed or performed in visit on 10/31/19  HIV Antibody (routine testing w rflx)  Result Value Ref Range   HIV Screen 4th Generation wRfx Non Reactive Non Reactive  RPR  Result Value Ref Range   RPR Ser Ql Non Reactive Non Reactive  Hepatitis C Antibody  Result Value Ref Range   Hep C Virus Ab <0.1 0.0 - 0.9 s/co ratio  CBC  Result Value Ref Range   WBC 6.5 3.4 - 10.8 x10E3/uL   RBC 4.79 3.77 - 5.28 x10E6/uL   Hemoglobin 14.5 11.1 -  15.9 g/dL   Hematocrit 44.2 34.0 - 46.6 %   MCV 92 79 - 97 fL   MCH 30.3 26.6 - 33.0 pg   MCHC 32.8 31.5 - 35.7 g/dL   RDW 13.3 11.7 - 15.4 %   Platelets 321 150 - 450 x10E3/uL  Lipid panel  Result Value Ref Range   Cholesterol, Total 150 100 - 199 mg/dL   Triglycerides 79 0 - 149 mg/dL   HDL 41 >39 mg/dL   VLDL Cholesterol Cal 15 5 - 40 mg/dL   LDL Chol Calc (NIH) 94 0 - 99 mg/dL   Chol/HDL Ratio 3.7 0.0 - 4.4 ratio  Cervicovaginal ancillary only  Result Value Ref Range   Neisseria Gonorrhea Negative    Chlamydia Negative    Trichomonas Negative    Bacterial Vaginitis (gardnerella) Positive (A)    Candida Vaginitis Positive (A)    Candida Glabrata  Negative    Comment      Normal Reference Range Bacterial Vaginosis - Negative   Comment Normal Reference Range Candida Species - Negative    Comment Normal Reference Range Candida Galbrata - Negative    Comment Normal Reference Range Trichomonas - Negative    Comment Normal Reference Ranger Chlamydia - Negative    Comment      Normal Reference Range Neisseria Gonorrhea - Negative     Assessment and Plan: 1. Essential hypertension Continue current medications.  Have encouraged her to take the potassium medication as long as she is on the hydrochlorothiazide.  Advised that the hydrochlorothiazide causes potassium loss in the urine.  Low potassium can cause abnormal heart rhythms that can be fatal if the potassium level is too low.  She expresses understanding. - Comprehensive metabolic panel; Future - hydrochlorothiazide (HYDRODIURIL) 25 MG tablet; Take 1 tablet (25 mg total) by mouth daily.  Dispense: 90 tablet; Refill: 1  2. Hyperlipidemia, unspecified hyperlipidemia type Continue Lipitor. - atorvastatin (LIPITOR) 20 MG tablet; Take 1 tablet (20 mg total) by mouth daily.  Dispense: 90 tablet; Refill: 1  3. Morbid obesity ([place]) She does not have a scale at home but feels she has lost more weight because she is down 1 pant size.  Encouraged her to continue trying to eat healthy and to move as much as she can.  4. Tobacco dependence Commended her on cutting back.  Her goal is still to quit.  She has just gotten started on the Wellbutrin.  Encouraged her to set a quit date.  5. Screening for colon cancer Discussed colon cancer screening.  She is now at the age where she will qualify.  She would like to have colonoscopy done. - Ambulatory referral to Gastroenterology   Follow Up Instructions: 4 mths   I discussed the assessment and treatment plan with the patient. The patient was provided an opportunity to ask questions and all were answered. The patient agreed with the plan and  demonstrated an understanding of the instructions.   The patient was advised to call back or seek an in-person evaluation if the symptoms worsen or if the condition fails to improve as anticipated.  I provided 10 minutes of non-face-to-face time during this encounter.   Karle Plumber, MD

## 2020-03-15 ENCOUNTER — Encounter: Payer: Self-pay | Admitting: Gastroenterology

## 2020-03-18 ENCOUNTER — Other Ambulatory Visit: Payer: Self-pay | Admitting: Family Medicine

## 2020-03-21 ENCOUNTER — Other Ambulatory Visit: Payer: Self-pay | Admitting: Internal Medicine

## 2020-03-21 DIAGNOSIS — I10 Essential (primary) hypertension: Secondary | ICD-10-CM

## 2020-03-26 ENCOUNTER — Telehealth: Payer: Self-pay

## 2020-03-26 NOTE — Telephone Encounter (Signed)
Last recording of BMI on 10/31/19 was 55.66.  Pt is noted to be 4'11".  PMH includes HTN and HLD.  Needs screening colonoscopy with [facility] (mat aunt and uncle in 76's-50's).  OK for direct hospital or OV?  Please advise

## 2020-03-26 NOTE — Telephone Encounter (Signed)
Okay for direct. Only needs office visit if it is patient preference. Thanks.

## 2020-03-26 NOTE — Telephone Encounter (Signed)
Diane [name] can you please call this patient to see if she would like an OV or direct colon at hospital.

## 2020-03-26 NOTE — Telephone Encounter (Signed)
Per Barbera Setters you should call the pt and see if she wants an office visit first. If not then it comes back to me to schedule the procedure. [facility] next hospital date is 05/21/20. Please see if that date will works for her.

## 2020-03-28 NOTE — Telephone Encounter (Signed)
Called patient to offer OV and inform that she will be required to have her procedure in the hospital setting.  NALM to call [phone] to discuss her upcoming procedure.

## 2020-03-28 NOTE — Telephone Encounter (Signed)
Patient called back and scheduled an office visit for 04/11/2020.  Previsit and procedure canceled.

## 2020-04-11 ENCOUNTER — Encounter: Payer: Self-pay | Admitting: Physician Assistant

## 2020-04-11 ENCOUNTER — Ambulatory Visit (INDEPENDENT_AMBULATORY_CARE_PROVIDER_SITE_OTHER): Payer: No Typology Code available for payment source | Admitting: Physician Assistant

## 2020-04-11 VITALS — BP 132/80 | Ht 59.0 in | Wt 245.0 lb

## 2020-04-11 DIAGNOSIS — Z1212 Encounter for screening for malignant neoplasm of rectum: Secondary | ICD-10-CM | POA: Diagnosis not present

## 2020-04-11 DIAGNOSIS — Z1211 Encounter for screening for malignant neoplasm of colon: Secondary | ICD-10-CM

## 2020-04-11 NOTE — Progress Notes (Signed)
Chief Complaint: Discuss colonoscopy  HPI:    Diane [name] is a 45 year old African-American female with a past medical history as listed below, who was referred to me by Ladell Pier, MD for discussion of colonoscopy.      Today, the patient presents to clinic and tells me that she is here to discuss a colonoscopy.  Initially she was brought in because her BMI was over 50 and she was needing a procedure in the hospital but she has been on Phentermine and lost 30 to 40 pounds and currently her BMI is only 49.48.  Tells me that she has been doing well really well with this medication.  Denies any GI complaints or concerns.    Denies fever, chills, blood in her stool or change in bowel habits.  Past Medical History:  Diagnosis Date  . Anxiety   . Cellulitis   . Hypertension     Past Surgical History:  Procedure Laterality Date  . CESAREAN SECTION  02-04-2010;  11-30-2006;  07-12-2001  . DILITATION & CURRETTAGE/HYSTROSCOPY WITH HYDROTHERMAL ABLATION N/A 10/01/2016   Procedure: DILATATION & CURETTAGE/HYSTEROSCOPY WITH HYDROTHERMAL ABLATION;  Surgeon: Dian Queen, MD;  Location: [facility];  Service: Gynecology;  Laterality: N/A;  . LAPAROSCOPIC TUBAL LIGATION Bilateral 03/02/2014   Procedure: BILATERAL LAPAROSCOPIC TUBAL LIGATION;  Surgeon: Cyril Mourning, MD;  Location: [facility];  Service: Gynecology;  Laterality: Bilateral;  . TUBAL LIGATION  2014  . WISDOM TOOTH EXTRACTION      Current Outpatient Medications  Medication Sig Dispense Refill  . ALPRAZolam (XANAX) 0.5 MG tablet Take 0.5 mg by mouth 3 (three) times daily as needed.    Marland Kitchen amLODipine (NORVASC) 5 MG tablet TAKE 1 TABLET BY MOUTH EVERY DAY 90 tablet 1  . atorvastatin (LIPITOR) 20 MG tablet Take 1 tablet (20 mg total) by mouth daily. 90 tablet 1  . buPROPion (WELLBUTRIN SR) 150 MG 12 hr tablet Take 1 tablet (150 mg total) by mouth 2 (two) times daily. TAKE 1 TABLET BY MOUTH EVERY DAY FOR 7 DAYS THEN  TWICE DAILY 180 tablet 1  . hydrochlorothiazide (HYDRODIURIL) 25 MG tablet Take 1 tablet (25 mg total) by mouth daily. 90 tablet 1  . phentermine (ADIPEX-P) 37.5 MG tablet Take 1 tablet by mouth daily.    . Potassium Chloride CR (MICRO-K) 8 MEQ CPCR capsule CR TAKE 1 CAPSULE (8 MEQ TOTAL) BY MOUTH DAILY. (Patient not taking: Reported on 03/04/2020) 90 capsule 0   No current facility-administered medications for this visit.    Allergies as of 04/11/2020 - Review Complete 03/04/2020  Allergen Reaction Noted  . Lisinopril Swelling 02/19/2018    Family History  Problem Relation Age of Onset  . Cancer Mother        throat cancer  . Hypertension Mother   . Heart disease Father 72  . Stroke Father   . Cancer Maternal Aunt        breast cancer  . Cancer Maternal Uncle        prostate  . Diabetes Maternal Grandmother   . Cancer Maternal Uncle        throat cancer  . Cancer Maternal Aunt        breast cancer--in remision  . Kidney disease Neg Hx     Social History   Socioeconomic History  . Marital status: Single    Spouse name: Not on file  . Number of children: 4  . Years of education: Not on file  .  Highest education level: Not on file  Occupational History  . Occupation: [employer]  Tobacco Use  . Smoking status: Current Every Day Smoker    Packs/day: 0.50    Years: 15.00    Pack years: 7.50    Types: Cigarettes  . Smokeless tobacco: Never Used  . Tobacco comment: pt trying to quit down to 3 -4 cig's daily from 1ppd  Substance and Sexual Activity  . Alcohol use: Yes    Comment:  occasionally  . Drug use: No  . Sexual activity: Yes    Birth control/protection: Surgical  Other Topics Concern  . Not on file  Social History Narrative   [employer]- works from home.     3 children- (36 year old adopted child) 27, 4 and 1.    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)   Single   Lives with sister and children   Social Determinants of Health   Financial Resource  Strain: Not on file  Food Insecurity: Not on file  Transportation Needs: Not on file  Physical Activity: Not on file  Stress: Not on file  Social Connections: Not on file  Intimate Partner Violence: Not on file    Review of Systems:    Constitutional: No fever or chills Skin: No rash Cardiovascular: No chest pain Respiratory: No SOB  Gastrointestinal: See HPI and otherwise negative Genitourinary: No dysuria Neurological: No headache, dizziness or syncope Musculoskeletal: No new muscle or joint pain Hematologic: No bleeding  Psychiatric: No history of depression or anxiety   Physical Exam:  Vital signs: BP 132/80   Ht 4\' 11"  (1.499 m)   Wt 245 lb (111.1 kg)   BMI 49.48 kg/m   Constitutional:   Pleasant obese AA female appears to be in NAD, Well developed, Well nourished, alert and cooperative Head:  Normocephalic and atraumatic. Eyes:   PEERL, EOMI. No icterus. Conjunctiva pink. Ears:  Normal auditory acuity. Neck:  Supple Throat: Oral cavity and pharynx without inflammation, swelling or lesion.  Respiratory: Respirations even and unlabored. Lungs clear to auscultation bilaterally.   No wheezes, crackles, or rhonchi.  Cardiovascular: Normal S1, S2. No MRG. Regular rate and rhythm. No peripheral edema, cyanosis or pallor.  Gastrointestinal:  Soft, nondistended, nontender. No rebound or guarding. Normal bowel sounds. No appreciable masses or hepatomegaly. Rectal:  Not performed.  Msk:  Symmetrical without gross deformities. Without edema, no deformity or joint abnormality.  Neurologic:  Alert and  oriented x4;  grossly normal neurologically.  Skin:   Dry and intact without significant lesions or rashes. Psychiatric: Demonstrates good judgement and reason without abnormal affect or behaviors.  No recent labs or imaging  Assessment: 1.  Screening for colorectal cancer: Patient is 76 and due for a screening colonoscopy, her BMI is now under 50 with the use of Phentermine 2.   Obesity  Plan: 1.  Discussed with patient that the reason she was initially brought in was due to her BMI, but congratulated her on her weight loss.  We can now schedule her in the [place].  She requested Dr. Tarri Glenn.  Scheduled patient for a screening colonoscopy with Dr. Tarri Glenn.  Did provide the patient with a detailed list of risks for procedure and she agrees to proceed.  She does request a Friday, there is no urgency to this procedure so allowed her to pick one that was available. 2.  Told the patient she would need to hold her Phentermine for 10 days prior to time of procedure.  She  verbalized understanding. 3.  Patient to follow in clinic per recommendations from Dr. Tarri Glenn after time of procedure.  Ellouise Newer, PA-C [facility] 04/11/2020, 3:17 PM  Cc: Ladell Pier, MD

## 2020-04-11 NOTE — Patient Instructions (Signed)
If you are age 46 or older, your body mass index should be between 23-30. Your Body mass index is 49.48 kg/m. If this is out of the aforementioned range listed, please consider follow up with your Primary Care Provider.  If you are age 13 or younger, your body mass index should be between 19-25. Your Body mass index is 49.48 kg/m. If this is out of the aformentioned range listed, please consider follow up with your Primary Care Provider.   Hold Phentermine 10 days prior to your procedure.  Thank you for choosing me and [facility].  Ellouise Newer, PA-C

## 2020-04-12 NOTE — Progress Notes (Signed)
Reviewed and agree with management plans. ? ?Nelly Scriven L. Jorryn Hershberger, MD, MPH  ?

## 2020-04-21 ENCOUNTER — Other Ambulatory Visit: Payer: Self-pay | Admitting: Family Medicine

## 2020-05-03 ENCOUNTER — Encounter: Payer: No Typology Code available for payment source | Admitting: Gastroenterology

## 2020-05-07 ENCOUNTER — Encounter: Payer: Self-pay | Admitting: Internal Medicine

## 2020-05-07 ENCOUNTER — Other Ambulatory Visit: Payer: Self-pay

## 2020-05-07 DIAGNOSIS — F17200 Nicotine dependence, unspecified, uncomplicated: Secondary | ICD-10-CM

## 2020-05-07 MED ORDER — BUPROPION HCL ER (SR) 150 MG PO TB12: 150.0000 mg | ORAL_TABLET | Freq: Two times a day (BID) | ORAL | 1 refills | Status: DC

## 2020-06-20 ENCOUNTER — Encounter: Payer: Self-pay | Admitting: Internal Medicine

## 2020-06-21 ENCOUNTER — Other Ambulatory Visit: Payer: Self-pay

## 2020-06-21 ENCOUNTER — Ambulatory Visit: Payer: No Typology Code available for payment source | Attending: Internal Medicine | Admitting: Internal Medicine

## 2020-06-21 ENCOUNTER — Encounter: Payer: Self-pay | Admitting: Internal Medicine

## 2020-06-21 DIAGNOSIS — N898 Other specified noninflammatory disorders of vagina: Secondary | ICD-10-CM

## 2020-06-21 MED ORDER — METRONIDAZOLE 500 MG PO TABS: 500.0000 mg | ORAL_TABLET | Freq: Two times a day (BID) | ORAL | 0 refills | Status: DC

## 2020-06-21 NOTE — Progress Notes (Signed)
Virtual Visit via Telephone Note  I connected with Diane [name] on 06/21/2020 at 8:15 a.m by telephone and verified that I am speaking with the correct person using two identifiers  Location: Patient: home Provider: office  Participants: Myself Patient   I discussed the limitations, risks, security and privacy concerns of performing an evaluation and management service by telephone and the availability of in person appointments. I also discussed with the patient that there may be a patient responsible charge related to this service. The patient expressed understanding and agreed to proceed.   History of Present Illness: Patient with history of tob dep, morbid obesity, HTN, HL, CTS and anxiety  Pt c/o vaginal dischg with odor x 5 days Thinks it is BV which she has had before. She does douch every 3-4 mths.  Sexually active with 1 female partner.  She declines coming in for vaginal swab.    Outpatient Encounter Medications as of 06/21/2020  Medication Sig   ALPRAZolam (XANAX) 0.5 MG tablet Take 0.5 mg by mouth 3 (three) times daily as needed.   amLODipine (NORVASC) 5 MG tablet TAKE 1 TABLET BY MOUTH EVERY DAY   atorvastatin (LIPITOR) 20 MG tablet Take 1 tablet (20 mg total) by mouth daily.   buPROPion (WELLBUTRIN SR) 150 MG 12 hr tablet Take 1 tablet (150 mg total) by mouth 2 (two) times daily. TAKE 1 TABLET BY MOUTH EVERY DAY FOR 7 DAYS THEN TWICE DAILY   hydrochlorothiazide (HYDRODIURIL) 25 MG tablet Take 1 tablet (25 mg total) by mouth daily.   phentermine (ADIPEX-P) 37.5 MG tablet Take 1 tablet by mouth daily.   Potassium Chloride CR (MICRO-K) 8 MEQ CPCR capsule CR TAKE 1 CAPSULE (8 MEQ TOTAL) BY MOUTH DAILY.   No facility-administered encounter medications on file as of 06/21/2020.      Observations/Objective: No direct observation done  Assessment and Plan: 1. Vaginal discharge Advised to discontinue douching.  We will treat empirically for BV with Flagyl. -  metroNIDAZOLE (FLAGYL) 500 MG tablet; Take 1 tablet (500 mg total) by mouth 2 (two) times daily.  Dispense: 14 tablet; Refill: 0  Follow Up Instructions: PRN   I discussed the assessment and treatment plan with the patient. The patient was provided an opportunity to ask questions and all were answered. The patient agreed with the plan and demonstrated an understanding of the instructions.   The patient was advised to call back or seek an in-person evaluation if the symptoms worsen or if the condition fails to improve as anticipated.  I  Spent 5 minutes on this telephone encounter  Karle Plumber, MD

## 2020-06-23 ENCOUNTER — Other Ambulatory Visit: Payer: Self-pay | Admitting: Internal Medicine

## 2020-06-23 DIAGNOSIS — E785 Hyperlipidemia, unspecified: Secondary | ICD-10-CM

## 2020-06-23 NOTE — Telephone Encounter (Signed)
Requested Prescriptions  Pending Prescriptions Disp Refills  . atorvastatin (LIPITOR) 20 MG tablet [Pharmacy Med Name: ATORVASTATIN 20 MG TABLET] 90 tablet 0    Sig: TAKE 1 TABLET BY MOUTH EVERY DAY     Cardiovascular:  Antilipid - Statins Passed - 06/23/2020  3:27 PM      Passed - Total Cholesterol in normal range and within 360 days    Cholesterol, Total  Date Value Ref Range Status  10/31/2019 150 100 - 199 mg/dL Final         Passed - LDL in normal range and within 360 days    LDL Chol Calc (NIH)  Date Value Ref Range Status  10/31/2019 94 0 - 99 mg/dL Final         Passed - HDL in normal range and within 360 days    HDL  Date Value Ref Range Status  10/31/2019 41 >39 mg/dL Final         Passed - Triglycerides in normal range and within 360 days    Triglycerides  Date Value Ref Range Status  10/31/2019 79 0 - 149 mg/dL Final         Passed - Patient is not pregnant      Passed - Valid encounter within last 12 months    Recent Outpatient Visits          2 days ago Vaginal discharge   [place], Deborah B, MD   3 months ago Essential hypertension   [place], Deborah B, MD   7 months ago Need for influenza vaccination   [place], RPH-CPP   7 months ago Screen for STD (sexually transmitted disease)   [place] Ladell Pier, MD   10 months ago Acute pain of right knee   [facility] [place], Darrick Penna, MD

## 2020-07-02 ENCOUNTER — Encounter: Payer: Self-pay | Admitting: Gastroenterology

## 2020-07-05 ENCOUNTER — Ambulatory Visit (AMBULATORY_SURGERY_CENTER): Payer: No Typology Code available for payment source | Admitting: Gastroenterology

## 2020-07-05 ENCOUNTER — Encounter: Payer: Self-pay | Admitting: Gastroenterology

## 2020-07-05 ENCOUNTER — Other Ambulatory Visit: Payer: Self-pay

## 2020-07-05 VITALS — BP 121/74 | HR 83 | Temp 98.0°F | Resp 20 | Ht 59.0 in | Wt 245.0 lb

## 2020-07-05 DIAGNOSIS — Z1211 Encounter for screening for malignant neoplasm of colon: Secondary | ICD-10-CM | POA: Diagnosis not present

## 2020-07-05 DIAGNOSIS — Z1212 Encounter for screening for malignant neoplasm of rectum: Secondary | ICD-10-CM

## 2020-07-05 DIAGNOSIS — D122 Benign neoplasm of ascending colon: Secondary | ICD-10-CM

## 2020-07-05 MED ORDER — SODIUM CHLORIDE 0.9 % IV SOLN: 500.0000 mL | Freq: Once | INTRAVENOUS | Status: DC

## 2020-07-05 NOTE — Op Note (Signed)
[facility] Patient Name: Diane [name] Procedure Date: 07/05/2020 3:02 PM MRN: [id number] Endoscopist: Thornton Park MD, MD Age: 45 Referring MD:  Date of Birth: 06-17-1975 Gender: Female Account #: [id number] Procedure:                Colonoscopy Indications:              Screening for colorectal malignant neoplasm, This                            is the patient's first colonoscopy Medicines:                Monitored Anesthesia Care Procedure:                Pre-Anesthesia Assessment:                           - Prior to the procedure, a History and Physical                            was performed, and patient medications and                            allergies were reviewed. The patient's tolerance of                            previous anesthesia was also reviewed. The risks                            and benefits of the procedure and the sedation                            options and risks were discussed with the patient.                            All questions were answered, and informed consent                            was obtained. Prior Anticoagulants: The patient has                            taken no previous anticoagulant or antiplatelet                            agents. ASA Grade Assessment: II - A patient with                            mild systemic disease. After reviewing the risks                            and benefits, the patient was deemed in                            satisfactory condition to undergo the procedure.  After obtaining informed consent, the colonoscope                            was passed under direct vision. Throughout the                            procedure, the patient's blood pressure, pulse, and                            oxygen saturations were monitored continuously. The                            Olympus CF-HQ190L (Serial# [id number]) Colonoscope was                            introduced through  the anus and advanced to the the                            cecum, identified by appendiceal orifice and                            ileocecal valve. The colonoscopy was performed                            without difficulty. The patient tolerated the                            procedure well. The quality of the bowel                            preparation was good. Over 717mL of liquid was                            removed from the colon during the procedure. The                            terminal ileum, ileocecal valve, appendiceal                            orifice, and rectum were photographed. Scope In: 3:16:04 PM Scope Out: 3:30:19 PM Scope Withdrawal Time: 0 hours 9 minutes 2 seconds  Total Procedure Duration: 0 hours 14 minutes 15 seconds  Findings:                 The perianal and digital rectal examinations were                            normal.                           A 2 mm polyp was found in the ascending colon. The                            polyp was sessile. The polyp was removed with a  cold snare. Resection and retrieval were complete.                            Estimated blood loss was minimal.                           The exam was otherwise without abnormality on                            direct and retroflexion views except for internal                            hemorrhoids. Complications:            No immediate complications. Estimated blood loss:                            Minimal. Estimated Blood Loss:     Estimated blood loss was minimal. Impression:               - One 2 mm polyp in the ascending colon, removed                            with a cold snare. Resected and retrieved.                           - The examination was otherwise normal on direct                            and retroflexion views. Recommendation:           - Patient has a contact number available for                            emergencies. The signs and  symptoms of potential                            delayed complications were discussed with the                            patient. Return to normal activities tomorrow.                            Written discharge instructions were provided to the                            patient.                           - Resume previous diet.                           - Continue present medications.                           - Await pathology results.                           -  Repeat colonoscopy date to be determined after                            pending pathology results are reviewed for                            surveillance. Consider a different bowel prep in                            the future.                           - Emerging evidence supports eating a diet of                            fruits, vegetables, grains, calcium, and yogurt                            while reducing red meat and alcohol may reduce the                            risk of colon cancer.                           - Thank you for allowing me to be involved in your                            colon cancer prevention. Thornton Park MD, MD 07/05/2020 3:34:26 PM This report has been signed electronically.

## 2020-07-05 NOTE — Progress Notes (Signed)
Medical history reviewed with no changes noted. VS assessed by C.W 

## 2020-07-05 NOTE — Progress Notes (Signed)
pt tolerated well. VSS. awake and to recovery. Report given to RN.  

## 2020-07-05 NOTE — Progress Notes (Signed)
Called to room to assist during endoscopic procedure.  Patient ID and intended procedure confirmed with present staff. Received instructions for my participation in the procedure from the performing physician.  

## 2020-07-05 NOTE — Patient Instructions (Signed)
Handout on polyps given to you to today  Await pathology results   YOU HAD AN ENDOSCOPIC PROCEDURE TODAY AT [place]:   Refer to the procedure report that was given to you for any specific questions about what was found during the examination.  If the procedure report does not answer your questions, please call your gastroenterologist to clarify.  If you requested that your care partner not be given the details of your procedure findings, then the procedure report has been included in a sealed envelope for you to review at your convenience later.  YOU SHOULD EXPECT: Some feelings of bloating in the abdomen. Passage of more gas than usual.  Walking can help get rid of the air that was put into your GI tract during the procedure and reduce the bloating. If you had a lower endoscopy (such as a colonoscopy or flexible sigmoidoscopy) you may notice spotting of blood in your stool or on the toilet paper. If you underwent a bowel prep for your procedure, you may not have a normal bowel movement for a few days.  Please Note:  You might notice some irritation and congestion in your nose or some drainage.  This is from the oxygen used during your procedure.  There is no need for concern and it should clear up in a day or so.  SYMPTOMS TO REPORT IMMEDIATELY:  Following lower endoscopy (colonoscopy or flexible sigmoidoscopy):  Excessive amounts of blood in the stool  Significant tenderness or worsening of abdominal pains  Swelling of the abdomen that is new, acute  Fever of 100F or higher  For urgent or emergent issues, a gastroenterologist can be reached at any hour by calling [phone]. Do not use MyChart messaging for urgent concerns.    DIET:  We do recommend a small meal at first, but then you may proceed to your regular diet.  Drink plenty of fluids but you should avoid alcoholic beverages for 24 hours.  ACTIVITY:  You should plan to take it easy for the rest of today and  you should NOT DRIVE or use heavy machinery until tomorrow (because of the sedation medicines used during the test).    FOLLOW UP: Our staff will call the number listed on your records 48-72 hours following your procedure to check on you and address any questions or concerns that you may have regarding the information given to you following your procedure. If we do not reach you, we will leave a message.  We will attempt to reach you two times.  During this call, we will ask if you have developed any symptoms of COVID 19. If you develop any symptoms (ie: fever, flu-like symptoms, shortness of breath, cough etc.) before then, please call [phone].  If you test positive for Covid 19 in the 2 weeks post procedure, please call and report this information to [place].    If any biopsies were taken you will be contacted by phone or by letter within the next 1-3 weeks.  Please call us at [phone] if you have not heard about the biopsies in 3 weeks.    SIGNATURES/CONFIDENTIALITY: You and/or your care partner have signed paperwork which will be entered into your electronic medical record.  These signatures attest to the fact that that the information above on your After Visit Summary has been reviewed and is understood.  Full responsibility of the confidentiality of this discharge information lies with you and/or your care-partner.

## 2020-07-09 ENCOUNTER — Telehealth: Payer: Self-pay | Admitting: *Deleted

## 2020-07-09 NOTE — Telephone Encounter (Signed)
First f/u call attempt. LVM

## 2020-07-09 NOTE — Telephone Encounter (Signed)
Unable to reach patient phone rang busy for follow up call.

## 2020-07-17 ENCOUNTER — Encounter: Payer: Self-pay | Admitting: Gastroenterology

## 2020-09-22 ENCOUNTER — Other Ambulatory Visit: Payer: Self-pay | Admitting: Family Medicine

## 2020-09-22 DIAGNOSIS — E876 Hypokalemia: Secondary | ICD-10-CM

## 2020-09-22 NOTE — Telephone Encounter (Signed)
Requested medication (s) are due for refill today: yes  Requested medication (s) are on the active medication list: yes  Last refill:  11/02/19 #90  Future visit scheduled: no  Notes to clinic:  Do not see any further refills past 11/10/19 #90-    Requested Prescriptions  Pending Prescriptions Disp Refills   Potassium Chloride CR (MICRO-K) 8 MEQ CPCR capsule CR [Pharmacy Med Name: POTASSIUM CL ER 8 MEQ CAPSULE] 90 capsule 0    Sig: TAKE 1 CAPSULE (8 MEQ TOTAL) BY MOUTH DAILY.     Endocrinology:  Minerals - Potassium Supplementation Failed - 09/22/2020 10:31 AM      Failed - K in normal range and within 360 days    Potassium  Date Value Ref Range Status  02/17/2019 3.4 (L) 3.5 - 5.2 mmol/L Final          Failed - Cr in normal range and within 360 days    Creat  Date Value Ref Range Status  11/02/2011 0.63 0.50 - 1.10 mg/dL Final   Creatinine, Ser  Date Value Ref Range Status  02/17/2019 0.74 0.57 - 1.00 mg/dL Final   Creatinine, Urine  Date Value Ref Range Status  01/03/2014 119.8 mg/dL Final    Comment:    No reference range established. Performed at [facility] - Valid encounter within last 12 months    Recent Outpatient Visits           3 months ago Vaginal discharge   [place], MD   6 months ago Essential hypertension   [place], MD   10 months ago Need for influenza vaccination   [place], RPH-CPP   10 months ago Screen for STD (sexually transmitted disease)   [place] Ladell Pier, MD   1 year ago Acute pain of right knee   [facility], Darrick Penna, MD

## 2020-09-30 ENCOUNTER — Other Ambulatory Visit: Payer: Self-pay | Admitting: Internal Medicine

## 2020-09-30 DIAGNOSIS — I10 Essential (primary) hypertension: Secondary | ICD-10-CM

## 2020-09-30 NOTE — Telephone Encounter (Signed)
Approved per protocol.  Requested Prescriptions  Pending Prescriptions Disp Refills  . amLODipine (NORVASC) 5 MG tablet [Pharmacy Med Name: AMLODIPINE BESYLATE 5 MG TAB] 90 tablet 0    Sig: TAKE 1 TABLET BY MOUTH EVERY DAY     Cardiovascular:  Calcium Channel Blockers Passed - 09/30/2020  1:42 AM      Passed - Last BP in normal range    BP Readings from Last 1 Encounters:  07/05/20 121/74         Passed - Valid encounter within last 6 months    Recent Outpatient Visits          3 months ago Vaginal discharge   [place], MD   7 months ago Essential hypertension   [place], MD   11 months ago Need for influenza vaccination   [place], RPH-CPP   11 months ago Screen for STD (sexually transmitted disease)   [place] Ladell Pier, MD   1 year ago Acute pain of right knee   [facility], Darrick Penna, MD

## 2020-10-19 ENCOUNTER — Other Ambulatory Visit: Payer: Self-pay | Admitting: Internal Medicine

## 2020-10-19 DIAGNOSIS — I10 Essential (primary) hypertension: Secondary | ICD-10-CM

## 2020-10-19 NOTE — Telephone Encounter (Signed)
Requested medication (s) are due for refill today: yes  Requested medication (s) are on the active medication list: yes  Last refill:  03/04/20 #90 1 RF  Future visit scheduled: no  Notes to clinic:  called pt nd LM on VM to call office to make appt to follow up- call back number provided   Requested Prescriptions  Pending Prescriptions Disp Refills   hydrochlorothiazide (HYDRODIURIL) 25 MG tablet [Pharmacy Med Name: HYDROCHLOROTHIAZIDE 25 MG TAB] 90 tablet 1    Sig: Take 1 tablet (25 mg total) by mouth daily.     Cardiovascular: Diuretics - Thiazide Failed - 10/19/2020  8:40 AM      Failed - Ca in normal range and within 360 days    Calcium  Date Value Ref Range Status  02/17/2019 8.8 8.7 - 10.2 mg/dL Final          Failed - Cr in normal range and within 360 days    Creat  Date Value Ref Range Status  11/02/2011 0.63 0.50 - 1.10 mg/dL Final   Creatinine, Ser  Date Value Ref Range Status  02/17/2019 0.74 0.57 - 1.00 mg/dL Final   Creatinine, Urine  Date Value Ref Range Status  01/03/2014 119.8 mg/dL Final    Comment:    No reference range established. Performed at [facility]           Failed - K in normal range and within 360 days    Potassium  Date Value Ref Range Status  02/17/2019 3.4 (L) 3.5 - 5.2 mmol/L Final          Failed - Na in normal range and within 360 days    Sodium  Date Value Ref Range Status  02/17/2019 140 134 - 144 mmol/L Final          Passed - Last BP in normal range    BP Readings from Last 1 Encounters:  07/05/20 121/74          Passed - Valid encounter within last 6 months    Recent Outpatient Visits           4 months ago Vaginal discharge   [employer], MD   7 months ago Essential hypertension   [place], MD   11 months ago Need for influenza vaccination   [place], RPH-CPP   11 months ago Screen for STD (sexually transmitted disease)   [place] Ladell Pier, MD   1 year ago Acute pain of right knee   [facility], Darrick Penna, MD

## 2020-10-22 NOTE — Telephone Encounter (Signed)
Last K checked in 02/2019. Will route request to patient's PCP.

## 2020-10-22 NOTE — Telephone Encounter (Signed)
Pt has an appt scheduled for 11/06/20. Please advise.

## 2020-10-22 NOTE — Addendum Note (Signed)
Addended by: Daisy Blossom, Annie Main L on: 10/22/2020 05:24 PM   Modules accepted: Orders

## 2020-10-23 MED ORDER — HYDROCHLOROTHIAZIDE 25 MG PO TABS: 25.0000 mg | ORAL_TABLET | Freq: Every day | ORAL | 0 refills | Status: DC

## 2020-11-06 ENCOUNTER — Encounter: Payer: Self-pay | Admitting: Physician Assistant

## 2020-11-06 ENCOUNTER — Ambulatory Visit: Payer: No Typology Code available for payment source | Attending: Physician Assistant | Admitting: Physician Assistant

## 2020-11-06 ENCOUNTER — Other Ambulatory Visit (HOSPITAL_COMMUNITY)
Admission: RE | Admit: 2020-11-06 | Discharge: 2020-11-06 | Disposition: A | Discharge status: Home / Self Care | Payer: No Typology Code available for payment source | Source: Ambulatory Visit | Attending: Physician Assistant | Admitting: Physician Assistant

## 2020-11-06 ENCOUNTER — Other Ambulatory Visit: Payer: Self-pay

## 2020-11-06 VITALS — BP 138/87 | HR 97 | Resp 16 | Wt 232.0 lb

## 2020-11-06 DIAGNOSIS — I10 Essential (primary) hypertension: Secondary | ICD-10-CM

## 2020-11-06 DIAGNOSIS — E876 Hypokalemia: Secondary | ICD-10-CM

## 2020-11-06 DIAGNOSIS — Z131 Encounter for screening for diabetes mellitus: Secondary | ICD-10-CM | POA: Insufficient documentation

## 2020-11-06 DIAGNOSIS — N898 Other specified noninflammatory disorders of vagina: Secondary | ICD-10-CM | POA: Diagnosis present

## 2020-11-06 DIAGNOSIS — E785 Hyperlipidemia, unspecified: Secondary | ICD-10-CM | POA: Diagnosis not present

## 2020-11-06 MED ORDER — ATORVASTATIN CALCIUM 20 MG PO TABS: 20.0000 mg | ORAL_TABLET | Freq: Every day | ORAL | 1 refills | Status: DC

## 2020-11-06 MED ORDER — AMLODIPINE BESYLATE 5 MG PO TABS: 5.0000 mg | ORAL_TABLET | Freq: Every day | ORAL | 1 refills | Status: DC

## 2020-11-06 MED ORDER — POTASSIUM CHLORIDE ER 8 MEQ PO CPCR: 8.0000 meq | ORAL_CAPSULE | Freq: Every day | ORAL | 1 refills | Status: DC

## 2020-11-06 MED ORDER — HYDROCHLOROTHIAZIDE 25 MG PO TABS: 25.0000 mg | ORAL_TABLET | Freq: Every day | ORAL | 1 refills | Status: DC

## 2020-11-06 NOTE — Progress Notes (Signed)
Patient ID: Diane [name], female   DOB: 1975-11-30, 45 y.o.   MRN: [id number]   Sandar Krinke, is a 45 y.o. female  YBO:[id number]  IDP:[id number]  DOB - [birth year]  Chief Complaint  Patient presents with   Hypertension       Subjective:   Diane [name] is a 45 y.o. female here today for med RF and STD screening.  Scant vaginal d/c.  No pelvic pain, urinary s/sx, or fever.    Patient has No headache, No chest pain, No abdominal pain - No Nausea, No new weakness tingling or numbness, No Cough - SOB.  No problems updated.  ALLERGIES: Allergies  Allergen Reactions   Lisinopril Swelling    Lip swelling    PAST MEDICAL HISTORY: Past Medical History:  Diagnosis Date   Anxiety    Cellulitis    Elevated cholesterol    Hypertension     MEDICATIONS AT HOME: Prior to Admission medications   Medication Sig Start Date End Date Taking? Authorizing Provider  ALPRAZolam Duanne Moron) 0.5 MG tablet Take 0.5 mg by mouth 3 (three) times daily as needed. 08/07/19   [provider]  amLODipine (NORVASC) 5 MG tablet Take 1 tablet (5 mg total) by mouth daily. 11/06/20   Argentina Donovan, PA-C  atorvastatin (LIPITOR) 20 MG tablet Take 1 tablet (20 mg total) by mouth daily. 11/06/20   Argentina Donovan, PA-C  buPROPion (WELLBUTRIN SR) 150 MG 12 hr tablet Take 1 tablet (150 mg total) by mouth 2 (two) times daily. TAKE 1 TABLET BY MOUTH EVERY DAY FOR 7 DAYS THEN TWICE DAILY 05/07/20   Ladell Pier, MD  hydrochlorothiazide (HYDRODIURIL) 25 MG tablet Take 1 tablet (25 mg total) by mouth daily. 11/06/20   Argentina Donovan, PA-C  metroNIDAZOLE (FLAGYL) 500 MG tablet Take 1 tablet (500 mg total) by mouth 2 (two) times daily. Patient not taking: No sig reported 06/21/20   Ladell Pier, MD  phentermine (ADIPEX-P) 37.5 MG tablet Take 1 tablet by mouth daily.    [provider]  Potassium Chloride CR (MICRO-K) 8 MEQ CPCR capsule CR Take 1 capsule (8 mEq total) by mouth  daily. 11/06/20   Raynor Calcaterra, Dionne Bucy, PA-C    ROS: Neg HEENT Neg resp Neg cardiac Neg GI Neg MS Neg psych Neg neuro  Objective:   Vitals:   11/06/20 1510  BP: 138/87  Pulse: 97  Resp: 16  SpO2: 100%  Weight: 232 lb (105.2 kg)   Exam General appearance : Awake, alert, not in any distress. Speech Clear. Not toxic looking HEENT: Atraumatic and NormocephaliNeck: Supple, no JVD. No cervical lymphadenopathy.  Chest: Good air entry bilaterally, CTAB.  No rales/rhonchi/wheezing CVS: S1 S2 regular, no murmurs.  Extremities: B/L Lower Ext shows no edema, both legs are warm to touch Neurology: Awake alert, and oriented X 3, CN II-XII intact, Non focal Skin: No Rash  Data Review Lab Results  Component Value Date   HGBA1C 5.7 (H) 01/05/2014    Assessment & Plan   1. Essential hypertension - amLODipine (NORVASC) 5 MG tablet; Take 1 tablet (5 mg total) by mouth daily.  Dispense: 90 tablet; Refill: 1 - Comprehensive metabolic panel - hydrochlorothiazide (HYDRODIURIL) 25 MG tablet; Take 1 tablet (25 mg total) by mouth daily.  Dispense: 90 tablet; Refill: 1  2. Hypokalemia - Potassium Chloride CR (MICRO-K) 8 MEQ CPCR capsule CR; Take 1 capsule (8 mEq total) by mouth daily.  Dispense: 90 capsule; Refill: 1  3. Vaginal  discharge - Cervicovaginal ancillary only - HIV antibody (with reflex) - RPR  4. Screening for diabetes mellitus I have had a lengthy discussion and provided education about insulin resistance and the intake of too much sugar/refined carbohydrates.  I have advised the patient to work at a goal of eliminating sugary drinks, candy, desserts, sweets, refined sugars, processed foods, and white carbohydrates.  The patient expresses understanding.  - Hemoglobin A1c  5. Hyperlipidemia, unspecified hyperlipidemia type - atorvastatin (LIPITOR) 20 MG tablet; Take 1 tablet (20 mg total) by mouth daily.  Dispense: 90 tablet; Refill: 1  Patient have been counseled extensively  about nutrition and exercise. Other issues discussed during this visit include: low cholesterol diet, weight control and daily exercise, foot care, annual eye examinations at Ophthalmology, importance of adherence with medications and regular follow-up. We also discussed long term complications of uncontrolled diabetes and hypertension.   Return in about 6 months (around 05/07/2021) for PCP-chronic conditions.  The patient was given clear instructions to go to ER or return to medical center if symptoms don't improve, worsen or new problems develop. The patient verbalized understanding. The patient was told to call to get lab results if they haven't heard anything in the next week.      Freeman Caldron, PA-C [facility] [place], [place]   11/06/2020, 3:16 PM

## 2020-11-07 ENCOUNTER — Other Ambulatory Visit: Payer: Self-pay | Admitting: Physician Assistant

## 2020-11-07 DIAGNOSIS — N898 Other specified noninflammatory disorders of vagina: Secondary | ICD-10-CM

## 2020-11-07 LAB — CERVICOVAGINAL ANCILLARY ONLY
Bacterial Vaginitis (gardnerella): POSITIVE — AB
Candida Glabrata: NEGATIVE
Candida Vaginitis: NEGATIVE
Chlamydia: NEGATIVE
Comment: NEGATIVE
Comment: NEGATIVE
Comment: NEGATIVE
Comment: NEGATIVE
Comment: NEGATIVE
Comment: NORMAL
Neisseria Gonorrhea: NEGATIVE
Trichomonas: NEGATIVE

## 2020-11-07 LAB — COMPREHENSIVE METABOLIC PANEL WITH GFR
ALT: 16 IU/L (ref 0–32)
AST: 21 IU/L (ref 0–40)
Albumin/Globulin Ratio: 1.2 (ref 1.2–2.2)
Albumin: 4.2 g/dL (ref 3.8–4.8)
Alkaline Phosphatase: 70 IU/L (ref 44–121)
BUN/Creatinine Ratio: 17 (ref 9–23)
BUN: 13 mg/dL (ref 6–24)
Bilirubin Total: <0.2 mg/dL (ref 0.0–1.2)
CO2: 27 mmol/L (ref 20–29)
Calcium: 9 mg/dL (ref 8.7–10.2)
Chloride: 98 mmol/L (ref 96–106)
Creatinine, Ser: 0.78 mg/dL (ref 0.57–1.00)
Globulin, Total: 3.5 g/dL (ref 1.5–4.5)
Glucose: 77 mg/dL (ref 70–99)
Potassium: 3.7 mmol/L (ref 3.5–5.2)
Sodium: 138 mmol/L (ref 134–144)
Total Protein: 7.7 g/dL (ref 6.0–8.5)
eGFR: 95 mL/min/1.73 (ref >59)

## 2020-11-07 LAB — HEMOGLOBIN A1C
Est. average glucose Bld gHb Est-mCnc: 114 mg/dL
Hgb A1c MFr Bld: 5.6 % (ref 4.8–5.6)

## 2020-11-07 LAB — RPR: RPR Ser Ql: NONREACTIVE

## 2020-11-07 LAB — HIV ANTIBODY (ROUTINE TESTING W REFLEX): HIV Screen 4th Generation wRfx: NONREACTIVE

## 2020-11-07 MED ORDER — METRONIDAZOLE 500 MG PO TABS: 500.0000 mg | ORAL_TABLET | Freq: Two times a day (BID) | ORAL | 0 refills | Status: DC

## 2020-11-08 ENCOUNTER — Telehealth (INDEPENDENT_AMBULATORY_CARE_PROVIDER_SITE_OTHER): Payer: Self-pay

## 2020-11-08 NOTE — Telephone Encounter (Signed)
Per DPR left message notifying patient of normal/negative labs. Informed of BV and prescription being sent to pharmacy. Asked to return call to [facility] at [phone] with any questions or concerns. Nat Christen, CMA

## 2020-11-08 NOTE — Telephone Encounter (Signed)
-----   Message from Argentina Donovan, [place] sent at 11/07/2020  2:53 PM EDT ----- Your vaginal swabs showed bacterial vaginosis.  This is NOT and STD.  You are negative for chlamydia, gonorrhea and trichomonas and yeast.  I sent you a prescription to [facility].  Follow up as planned.  Thanks, Freeman Caldron, PA-C

## 2020-12-16 ENCOUNTER — Other Ambulatory Visit: Payer: Self-pay | Admitting: Internal Medicine

## 2020-12-16 DIAGNOSIS — F17200 Nicotine dependence, unspecified, uncomplicated: Secondary | ICD-10-CM

## 2020-12-17 NOTE — Telephone Encounter (Signed)
Requested medication (s) are due for refill today: too early  Requested medication (s) are on the active medication list: yes  Last refill:  10/19/20 for 90 day supply  Future visit scheduled: no  Notes to clinic:  requesting early, please assess.  Requested Prescriptions  Pending Prescriptions Disp Refills   buPROPion (WELLBUTRIN SR) 150 MG 12 hr tablet [Pharmacy Med Name: BUPROPION HCL SR 150 MG TABLET] 180 tablet 1    Sig: TAKE 1 TABLET BY MOUTH EVERY DAY FOR 7 DAYS THEN TWICE DAILY     Psychiatry: Antidepressants - bupropion Passed - 12/16/2020  4:37 PM      Passed - Last BP in normal range    BP Readings from Last 1 Encounters:  11/06/20 138/87          Passed - Valid encounter within last 6 months    Recent Outpatient Visits           1 month ago Vaginal discharge   [place], [place]   5 months ago Vaginal discharge   [place], Deborah B, MD   9 months ago Essential hypertension   [place] Ladell Pier, MD   1 year ago Need for influenza vaccination   [place], RPH-CPP   1 year ago Screen for STD (sexually transmitted disease)   [facility] Ladell Pier, MD

## 2021-01-23 ENCOUNTER — Other Ambulatory Visit: Payer: Self-pay | Admitting: Family Medicine

## 2021-01-23 NOTE — Telephone Encounter (Signed)
Not on current med profile. Discontinued 03/04/20

## 2021-01-29 DIAGNOSIS — Z0289 Encounter for other administrative examinations: Secondary | ICD-10-CM

## 2021-02-06 ENCOUNTER — Other Ambulatory Visit: Payer: Self-pay

## 2021-02-06 ENCOUNTER — Encounter (INDEPENDENT_AMBULATORY_CARE_PROVIDER_SITE_OTHER): Payer: Self-pay | Admitting: Family Medicine

## 2021-02-06 ENCOUNTER — Encounter: Payer: Self-pay | Admitting: Internal Medicine

## 2021-02-06 ENCOUNTER — Ambulatory Visit (INDEPENDENT_AMBULATORY_CARE_PROVIDER_SITE_OTHER): Payer: No Typology Code available for payment source | Admitting: Family Medicine

## 2021-02-06 VITALS — BP 122/80 | HR 87 | Temp 97.8°F | Ht 59.0 in | Wt 227.0 lb

## 2021-02-06 DIAGNOSIS — R7303 Prediabetes: Secondary | ICD-10-CM

## 2021-02-06 DIAGNOSIS — Z720 Tobacco use: Secondary | ICD-10-CM

## 2021-02-06 DIAGNOSIS — I10 Essential (primary) hypertension: Secondary | ICD-10-CM | POA: Diagnosis not present

## 2021-02-06 DIAGNOSIS — Z9189 Other specified personal risk factors, not elsewhere classified: Secondary | ICD-10-CM

## 2021-02-06 DIAGNOSIS — Z6841 Body Mass Index (BMI) 40.0 and over, adult: Secondary | ICD-10-CM

## 2021-02-06 DIAGNOSIS — R0602 Shortness of breath: Secondary | ICD-10-CM

## 2021-02-06 DIAGNOSIS — F419 Anxiety disorder, unspecified: Secondary | ICD-10-CM

## 2021-02-06 DIAGNOSIS — Z1331 Encounter for screening for depression: Secondary | ICD-10-CM | POA: Diagnosis not present

## 2021-02-06 DIAGNOSIS — R5383 Other fatigue: Secondary | ICD-10-CM

## 2021-02-06 DIAGNOSIS — E669 Obesity, unspecified: Secondary | ICD-10-CM

## 2021-02-06 DIAGNOSIS — E7849 Other hyperlipidemia: Secondary | ICD-10-CM | POA: Diagnosis not present

## 2021-02-07 LAB — COMPREHENSIVE METABOLIC PANEL
ALT: 21 IU/L (ref 0–32)
AST: 18 IU/L (ref 0–40)
Albumin/Globulin Ratio: 1.2 (ref 1.2–2.2)
Albumin: 4.1 g/dL (ref 3.8–4.8)
Alkaline Phosphatase: 77 IU/L (ref 44–121)
BUN/Creatinine Ratio: 18 (ref 9–23)
BUN: 14 mg/dL (ref 6–24)
Bilirubin Total: 0.3 mg/dL (ref 0.0–1.2)
CO2: 28 mmol/L (ref 20–29)
Calcium: 9.3 mg/dL (ref 8.7–10.2)
Chloride: 99 mmol/L (ref 96–106)
Creatinine, Ser: 0.77 mg/dL (ref 0.57–1.00)
Globulin, Total: 3.4 g/dL (ref 1.5–4.5)
Glucose: 75 mg/dL (ref 70–99)
Potassium: 4 mmol/L (ref 3.5–5.2)
Sodium: 140 mmol/L (ref 134–144)
Total Protein: 7.5 g/dL (ref 6.0–8.5)
eGFR: 96 mL/min/{1.73_m2} (ref >59)

## 2021-02-07 LAB — CBC WITH DIFFERENTIAL/PLATELET
Basophils Absolute: 0 10*3/uL (ref 0.0–0.2)
Basos: 1 %
EOS (ABSOLUTE): 0.1 10*3/uL (ref 0.0–0.4)
Eos: 1 %
Hematocrit: 40.7 % (ref 34.0–46.6)
Hemoglobin: 13.5 g/dL (ref 11.1–15.9)
Immature Grans (Abs): 0 10*3/uL (ref 0.0–0.1)
Immature Granulocytes: 0 %
Lymphocytes Absolute: 1.3 10*3/uL (ref 0.7–3.1)
Lymphs: 27 %
MCH: 30.5 pg (ref 26.6–33.0)
MCHC: 33.2 g/dL (ref 31.5–35.7)
MCV: 92 fL (ref 79–97)
Monocytes Absolute: 0.4 10*3/uL (ref 0.1–0.9)
Monocytes: 8 %
Neutrophils Absolute: 3.1 10*3/uL (ref 1.4–7.0)
Neutrophils: 63 %
Platelets: 320 10*3/uL (ref 150–450)
RBC: 4.43 x10E6/uL (ref 3.77–5.28)
RDW: 12.6 % (ref 11.7–15.4)
WBC: 4.9 10*3/uL (ref 3.4–10.8)

## 2021-02-07 LAB — HEMOGLOBIN A1C
Est. average glucose Bld gHb Est-mCnc: 120 mg/dL
Hgb A1c MFr Bld: 5.8 % — ABNORMAL HIGH (ref 4.8–5.6)

## 2021-02-07 LAB — LIPID PANEL WITH LDL/HDL RATIO
Cholesterol, Total: 165 mg/dL (ref 100–199)
HDL: 55 mg/dL (ref >39)
LDL Chol Calc (NIH): 96 mg/dL (ref 0–99)
LDL/HDL Ratio: 1.7 ratio (ref 0.0–3.2)
Triglycerides: 72 mg/dL (ref 0–149)
VLDL Cholesterol Cal: 14 mg/dL (ref 5–40)

## 2021-02-07 LAB — T4, FREE: Free T4: 1.16 ng/dL (ref 0.82–1.77)

## 2021-02-07 LAB — TSH: TSH: 1.74 u[IU]/mL (ref 0.450–4.500)

## 2021-02-07 LAB — INSULIN, RANDOM: INSULIN: 12.1 u[IU]/mL (ref 2.6–24.9)

## 2021-02-07 LAB — FOLATE: Folate: 11.7 ng/mL (ref >3.0)

## 2021-02-07 LAB — VITAMIN D 25 HYDROXY (VIT D DEFICIENCY, FRACTURES): Vit D, 25-Hydroxy: 8.4 ng/mL — ABNORMAL LOW (ref 30.0–100.0)

## 2021-02-07 LAB — VITAMIN B12: Vitamin B-12: 344 pg/mL (ref 232–1245)

## 2021-02-07 LAB — T3: T3, Total: 118 ng/dL (ref 71–180)

## 2021-02-10 NOTE — Progress Notes (Signed)
Chief Complaint:   OBESITY Diane [name] (MR# [id number]) is a 46 y.o. female who presents for evaluation and treatment of obesity and related comorbidities. Current BMI is Body mass index is 45.85 kg/m. Diane [name] has been struggling with her weight for many years and has been unsuccessful in either losing weight, maintaining weight loss, or reaching her healthy weight goal.  [name] is currently in the action stage of change and ready to dedicate time achieving and maintaining a healthier weight. [name] is interested in becoming our patient and working on intensive lifestyle modifications including (but not limited to) diet and exercise for weight loss.  Pt's sister is a pt here. Pt was on Phentermine 37.5 mg for about a year and lost 65 lbs in 2020. She works from home M-F 8-5. Pt is only eating once a day. Morning breakfast- 1-2 bags cheez-its or orange + banana (hungry; feels satisfied). Chicken salad bowl from [employer] + crackers or scalloped potatoes and green beans.  Diane [name]'s habits were reviewed today and are as follows: she thinks her family will eat healthier with her, her desired weight loss is 67 lbs, she started gaining weight after her 2nd pregnancy, her heaviest weight ever was 285 pounds, she skips meals frequently, she is frequently drinking liquids with calories, she frequently makes poor food choices, she frequently eats larger portions than normal, and she struggles with emotional eating.  Depression Screen Diane [name]'s Food and Mood (modified PHQ-9) score was 6.  Depression screen [facility] 2/9 02/06/2021  Decreased Interest 0  Down, Depressed, Hopeless 1  PHQ - 2 Score 1  Altered sleeping 1  Tired, decreased energy 2  Change in appetite 1  Feeling bad or failure about yourself  1  Trouble concentrating 0  Moving slowly or fidgety/restless 0  Suicidal thoughts 0  PHQ-9 Score 6  Difficult doing work/chores Not difficult at all   Subjective:   1. Other fatigue Pt  admits to daytime and morning sleepiness. She admits to waking up tired. Pt gets 6 hours of sleep at night. Poor sleep quality. Pt reports morning headaches once or twice a week. She admits to snoring and denies apneic episodes. Epworth Score: 8. EKG normal sinus rhythm at 87 bpm.      2. SOBOE (shortness of breath on exertion) Diane [name] notes increasing shortness of breath with exercising and seems to be worsening over time with weight gain. She notes getting out of breath sooner with activity than she used to. This has gotten worse recently. Diane [name] denies shortness of breath at rest or orthopnea.  3. Essential hypertension Symptoms started in 2nd pregnancy (pt did deliver early). Pt has no h/o preeclampsia. BP well controlled on HCTZ and amlodipine.  4. Other hyperlipidemia Dx recently. Pt is on atorvastatin and last LDL was within normal limits.  5. Prediabetes A1c in October 2022 was within normal limits. Pt is not on meds.  6. Anxiety She takes Xanax as needed.   7. Tobacco abuse Pt smokes 1/2 ppd currently (previously, at least 1 ppd). She wants to quit and is on Wellbutrin BID.  8. At risk for deficient intake of food Leler is at risk for deficient intake of food due to inadequate intake.  Assessment/Plan:   1. Other fatigue Opel does feel that her weight is causing her energy to be lower than it should be. Fatigue may be related to obesity, depression or many other causes. Labs will be ordered, and in the meanwhile, Sitlali will focus  on self care including making healthy food choices, increasing physical activity and focusing on stress reduction. Check labs today.  - EKG 12-Lead - Vitamin B12 - Folate - T3 - T4, free - TSH - VITAMIN D 25 Hydroxy (Vit-D Deficiency, Fractures)  2. SOBOE (shortness of breath on exertion) [name] does feel that she gets out of breath more easily that she used to when she exercises. [name]'s shortness of breath appears to be obesity related and  exercise induced. She has agreed to work on weight loss and gradually increase exercise to treat her exercise induced shortness of breath. Will continue to monitor closely. Check labs today.  - CBC with Differential/Platelet  3. Essential hypertension Diane [name] is working on healthy weight loss and exercise to improve blood pressure control. We will watch for signs of hypotension as she continues her lifestyle modifications. Check labs today.  - Comprehensive metabolic panel  4. Other hyperlipidemia Cardiovascular risk and specific lipid/LDL goals reviewed.  We discussed several lifestyle modifications today and [name] will continue to work on diet, exercise and weight loss efforts. Orders and follow up as documented in patient record.   Counseling Intensive lifestyle modifications are the first line treatment for this issue. Dietary changes: Increase soluble fiber. Decrease simple carbohydrates. Exercise changes: Moderate to vigorous-intensity aerobic activity 150 minutes per week if tolerated. Lipid-lowering medications: see documented in medical record. Check labs today. - Lipid Panel With LDL/HDL Ratio  5. Prediabetes Diane [name] will continue to work on weight loss, exercise, and decreasing simple carbohydrates to help decrease the risk of diabetes. Check labs today.  - Hemoglobin A1c - Insulin, random  6. Anxiety Behavior modification techniques were discussed today to help Diane [name] deal with her anxiety.  Orders and follow up as documented in patient record. Follow up at subsequent appts.  7. Tobacco abuse Decrease intake daily. F/u on cessation at next appt.  8. Depression screening Diane [name] had a positive depression screening. Depression is commonly associated with obesity and often results in emotional eating behaviors. We will monitor this closely and work on CBT to help improve the non-hunger eating patterns. Referral to Psychology may be required if no improvement is seen as she  continues in our clinic.  9. At risk for deficient intake of food Diane [name] was given approximately 15 minutes of deficient intake of food prevention counseling today. [name] is at risk for eating too few calories based on current food recall. She was encouraged to focus on meeting caloric and protein goals according to her recommended meal plan.  10. Obesity with current BMI of 46.0 Shelby is currently in the action stage of change and her goal is to continue with weight loss efforts. I recommend Pammie begin the structured treatment plan as follows:  She has agreed to the Category 3 Plan and keeping a food journal and adhering to recommended goals of 450-600 calories and 40+ grams protein with supper.  Exercise goals: No exercise has been prescribed at this time.   Behavioral modification strategies: increasing lean protein intake, no skipping meals, and meal planning and cooking strategies.  She was informed of the importance of frequent follow-up visits to maximize her success with intensive lifestyle modifications for her multiple health conditions. She was informed we would discuss her lab results at her next visit unless there is a critical issue that needs to be addressed sooner. Graceanna agreed to keep her next visit at the agreed upon time to discuss these results.  Objective:   Blood pressure 122/80, pulse  87, temperature 97.8 F (36.6 C), height 4\' 11"  (1.499 m), weight 227 lb (103 kg), SpO2 100 %. Body mass index is 45.85 kg/m.  EKG: Normal sinus rhythm, rate 87.  Indirect Calorimeter completed today shows a VO2 of 270 and a REE of 1858.  Her calculated basal metabolic rate is 1448 thus her basal metabolic rate is better than expected.  General: Cooperative, alert, well developed, in no acute distress. HEENT: Conjunctivae and lids unremarkable. Cardiovascular: Regular rhythm.  Lungs: Normal work of breathing. Neurologic: No focal deficits.   Lab Results  Component Value Date    CREATININE 0.77 02/06/2021   BUN 14 02/06/2021   NA 140 02/06/2021   K 4.0 02/06/2021   CL 99 02/06/2021   CO2 28 02/06/2021   Lab Results  Component Value Date   ALT 21 02/06/2021   AST 18 02/06/2021   ALKPHOS 77 02/06/2021   BILITOT 0.3 02/06/2021   Lab Results  Component Value Date   HGBA1C 5.8 (H) 02/06/2021   HGBA1C 5.6 11/06/2020   HGBA1C 5.7 (H) 01/05/2014   Lab Results  Component Value Date   INSULIN 12.1 02/06/2021   Lab Results  Component Value Date   TSH 1.740 02/06/2021   Lab Results  Component Value Date   CHOL 165 02/06/2021   HDL 55 02/06/2021   LDLCALC 96 02/06/2021   TRIG 72 02/06/2021   CHOLHDL 3.7 10/31/2019   Lab Results  Component Value Date   WBC 4.9 02/06/2021   HGB 13.5 02/06/2021   HCT 40.7 02/06/2021   MCV 92 02/06/2021   PLT 320 02/06/2021    Attestation Statements:   Reviewed by clinician on day of visit: allergies, medications, problem list, medical history, surgical history, family history, social history, and previous encounter notes.  Coral Ceo, CMA, am acting as transcriptionist for Coralie Common, MD.   This is the patient's first visit at [facility]. The patient's NEW PATIENT PACKET was reviewed at length. Included in the packet: current and past health history, medications, allergies, ROS, gynecologic history (women only), surgical history, family history, social history, weight history, weight loss surgery history (for those that have had weight loss surgery), nutritional evaluation, mood and food questionnaire, PHQ9, Epworth questionnaire, sleep habits questionnaire, patient life and health improvement goals questionnaire. These will all be scanned into the patient's chart under media.   During the visit, I independently reviewed the patient's EKG, bioimpedance scale results, and indirect calorimeter results. I used this information to tailor a meal plan for the patient that will help her to  lose weight and will improve her obesity-related conditions going forward. I performed a medically necessary appropriate examination and/or evaluation. I discussed the assessment and treatment plan with the patient. The patient was provided an opportunity to ask questions and all were answered. The patient agreed with the plan and demonstrated an understanding of the instructions. Labs were ordered at this visit and will be reviewed at the next visit unless more critical results need to be addressed immediately. Clinical information was updated and documented in the EMR.   Time spent on visit including pre-visit chart review and post-visit care was 45 minutes.   A separate 15 minutes was spent on risk counseling (see above).  I have reviewed the above documentation for accuracy and completeness, and I agree with the above. - Coralie Common, MD

## 2021-02-20 ENCOUNTER — Encounter (INDEPENDENT_AMBULATORY_CARE_PROVIDER_SITE_OTHER): Payer: Self-pay | Admitting: Family Medicine

## 2021-02-20 ENCOUNTER — Other Ambulatory Visit: Payer: Self-pay

## 2021-02-20 ENCOUNTER — Ambulatory Visit (INDEPENDENT_AMBULATORY_CARE_PROVIDER_SITE_OTHER): Payer: No Typology Code available for payment source | Admitting: Family Medicine

## 2021-02-20 VITALS — BP 124/66 | HR 98 | Temp 98.6°F | Ht 59.0 in | Wt 225.0 lb

## 2021-02-20 DIAGNOSIS — E559 Vitamin D deficiency, unspecified: Secondary | ICD-10-CM

## 2021-02-20 DIAGNOSIS — E7849 Other hyperlipidemia: Secondary | ICD-10-CM | POA: Diagnosis not present

## 2021-02-20 DIAGNOSIS — Z720 Tobacco use: Secondary | ICD-10-CM

## 2021-02-20 DIAGNOSIS — E669 Obesity, unspecified: Secondary | ICD-10-CM

## 2021-02-20 DIAGNOSIS — R7303 Prediabetes: Secondary | ICD-10-CM | POA: Diagnosis not present

## 2021-02-20 DIAGNOSIS — Z9189 Other specified personal risk factors, not elsewhere classified: Secondary | ICD-10-CM

## 2021-02-20 DIAGNOSIS — Z6841 Body Mass Index (BMI) 40.0 and over, adult: Secondary | ICD-10-CM

## 2021-02-20 MED ORDER — VITAMIN D (ERGOCALCIFEROL) 1.25 MG (50000 UNIT) PO CAPS: 50000.0000 [IU] | ORAL_CAPSULE | ORAL | 0 refills | Status: AC

## 2021-02-20 NOTE — Progress Notes (Signed)
Chief Complaint:   OBESITY Diane [name] is here to discuss her progress with her obesity treatment plan along with follow-up of her obesity related diagnoses. Diane [name] is on the Category 3 Plan and keeping a food journal and adhering to recommended goals of 450-600 calories and 40+ grams protein and states she is following her eating plan approximately 40% of the time. Diane [name] states she is dancing 30-35 minutes 1 times per week.  Today's visit was #: 2 Starting weight: 227 lbs Starting date: 02/06/2021 Today's weight: 225 lbs Today's date: 02/20/2021 Total lbs lost to date: 2 Total lbs lost since last in-office visit: 2  Interim History: Pt voices there is still a lot to get used to and she is still not getting all food in. She got some cereal, milk, and Greek yogurt. She thinks prep would be easiest, especially for lunch time. She is getting in 2 meals a day. Pt did cereal and [employer], [place] sandwich on wheat bread, and chicken and vegetables. She has no plans for the next few weeks.  Subjective:   1. Vitamin D deficiency Pt has a Vit D level of 8 and reports fatigue.  2. Other hyperlipidemia Diane [name] has an LDL of 96, HDL 55, and triglycerides 72. She is on Lipitor.  3. Prediabetes Diane [name]'s A1c is 5.8 with an insulin level of 12.1. She is not on meds.  4. Tobacco abuse Pt has a prescription for Wellbutrin SR. She is still smoking 1.5 ppd.  5. At risk for coronary artery disease and diabetes Discussed the effects of comorbidities and tobacco.  Assessment/Plan:   1. Vitamin D deficiency Low Vitamin D level contributes to fatigue and are associated with obesity, breast, and colon cancer. She agrees to start to take prescription Vitamin D 50,000 IU every week and will follow-up for routine testing of Vitamin D, at least 2-3 times per year to avoid over-replacement.  Start- Vitamin D, Ergocalciferol, (DRISDOL) 1.25 MG (50000 UNIT) CAPS capsule; Take 1 capsule (50,000 Units total) by  mouth 2 (two) times a week.  Dispense: 8 capsule; Refill: 0  2. Other hyperlipidemia Cardiovascular risk and specific lipid/LDL goals reviewed.  We discussed several lifestyle modifications today and Diane [name] will continue to work on diet, exercise and weight loss efforts. Orders and follow up as documented in patient record. Continue current treatment plan.  Counseling Intensive lifestyle modifications are the first line treatment for this issue. Dietary changes: Increase soluble fiber. Decrease simple carbohydrates. Exercise changes: Moderate to vigorous-intensity aerobic activity 150 minutes per week if tolerated. Lipid-lowering medications: see documented in medical record.  3. Prediabetes Diane [name] will continue to work on weight loss, exercise, and decreasing simple carbohydrates to help decrease the risk of diabetes. Repeat labs in 3 months. No meds at this time, as we will determine medication with cravings and weight loss.  4. Tobacco abuse Pt is contemplating quitting. We discussed quitting strategies of medication, TR therapy/products.  5. At risk for coronary artery disease and diabetes Discussed tobacco cessation options: Make a plan and quit date by next appt.  6. Obesity with current BMI of 45.6 Diane [name] is currently in the action stage of change. As such, her goal is to continue with weight loss efforts. She has agreed to the Category 3 Plan and keeping a food journal and adhering to recommended goals of 450-600 calories and 40+ grams protein with supper.   Exercise goals:  As is  Behavioral modification strategies: increasing lean protein intake, no skipping meals, meal  planning and cooking strategies, and planning for success.  Diane [name] has agreed to follow-up with our clinic in 2 weeks. She was informed of the importance of frequent follow-up visits to maximize her success with intensive lifestyle modifications for her multiple health conditions.   Objective:   Blood pressure  124/66, pulse 98, temperature 98.6 F (37 C), height 4\' 11"  (1.499 m), weight 225 lb (102.1 kg), SpO2 100 %. Body mass index is 45.44 kg/m.  General: Cooperative, alert, well developed, in no acute distress. HEENT: Conjunctivae and lids unremarkable. Cardiovascular: Regular rhythm.  Lungs: Normal work of breathing. Neurologic: No focal deficits.   Lab Results  Component Value Date   CREATININE 0.77 02/06/2021   BUN 14 02/06/2021   NA 140 02/06/2021   K 4.0 02/06/2021   CL 99 02/06/2021   CO2 28 02/06/2021   Lab Results  Component Value Date   ALT 21 02/06/2021   AST 18 02/06/2021   ALKPHOS 77 02/06/2021   BILITOT 0.3 02/06/2021   Lab Results  Component Value Date   HGBA1C 5.8 (H) 02/06/2021   HGBA1C 5.6 11/06/2020   HGBA1C 5.7 (H) 01/05/2014   Lab Results  Component Value Date   INSULIN 12.1 02/06/2021   Lab Results  Component Value Date   TSH 1.740 02/06/2021   Lab Results  Component Value Date   CHOL 165 02/06/2021   HDL 55 02/06/2021   LDLCALC 96 02/06/2021   TRIG 72 02/06/2021   CHOLHDL 3.7 10/31/2019   Lab Results  Component Value Date   VD25OH 8.4 (L) 02/06/2021   Lab Results  Component Value Date   WBC 4.9 02/06/2021   HGB 13.5 02/06/2021   HCT 40.7 02/06/2021   MCV 92 02/06/2021   PLT 320 02/06/2021   Attestation Statements:   Reviewed by clinician on day of visit: allergies, medications, problem list, medical history, surgical history, family history, social history, and previous encounter notes.  Coral Ceo, CMA, am acting as transcriptionist for Coralie Common, MD.   I have reviewed the above documentation for accuracy and completeness, and I agree with the above. - Coralie Common, MD

## 2021-03-10 ENCOUNTER — Encounter: Payer: Self-pay | Admitting: Internal Medicine

## 2021-03-12 ENCOUNTER — Ambulatory Visit: Payer: Self-pay

## 2021-03-12 ENCOUNTER — Ambulatory Visit: Payer: No Typology Code available for payment source | Attending: Internal Medicine | Admitting: Internal Medicine

## 2021-03-12 ENCOUNTER — Other Ambulatory Visit: Payer: Self-pay

## 2021-03-12 DIAGNOSIS — N898 Other specified noninflammatory disorders of vagina: Secondary | ICD-10-CM | POA: Diagnosis not present

## 2021-03-12 MED ORDER — FLUCONAZOLE 150 MG PO TABS: 150.0000 mg | ORAL_TABLET | Freq: Once | ORAL | 0 refills | Status: AC

## 2021-03-12 NOTE — Telephone Encounter (Signed)
Pt has been taking amoxicillin and has a yeast infection and is asking for medication for it/ Dr. Wynetta Emery advised her to call the office today to get an urgent care appt / crm sent to office about this appt /please advise  ? ?Left message to call back. ?

## 2021-03-12 NOTE — Progress Notes (Signed)
Virtual Visit via Video Note ? ?I connected with Diane [name] on 03/12/2021 at 3:12 PM by a video enabled telemedicine application and verified that I am speaking with the correct person using two identifiers. ? ?Location: ?Patient: home ?Provider: Office ?  ?I discussed the limitations of evaluation and management by telemedicine and the availability of in person appointments. The patient expressed understanding and agreed to proceed. ? ?History of Present Illness: ?Patient with history of tob dep, morbid obesity, HTN, HL, CTS and anxiety.  This is an urgent care visit. ?She complains of having an off-white vaginal discharge and vaginal itching x8 to 9 days.  She reports having taken amoxicillin that was prescribed by the dentist after a tooth extraction.  Symptoms started a few days after that.  She is sexually active with 1 female partner for the past 1 year.  She does not douche.  She reports lack of transportation to be able to come into the office. ? ?Outpatient Encounter Medications as of 03/12/2021  ?Medication Sig  ? ALPRAZolam (XANAX) 0.5 MG tablet Take 0.5 mg by mouth 3 (three) times daily as needed.  ? amLODipine (NORVASC) 5 MG tablet Take 1 tablet (5 mg total) by mouth daily.  ? atorvastatin (LIPITOR) 20 MG tablet Take 1 tablet (20 mg total) by mouth daily.  ? buPROPion (WELLBUTRIN SR) 150 MG 12 hr tablet Take 1 tablet (150 mg total) by mouth 2 (two) times daily.  ? hydrochlorothiazide (HYDRODIURIL) 25 MG tablet Take 1 tablet (25 mg total) by mouth daily.  ? phentermine (ADIPEX-P) 37.5 MG tablet Take 1 tablet by mouth daily.  ? Potassium Chloride CR (MICRO-K) 8 MEQ CPCR capsule CR Take 1 capsule (8 mEq total) by mouth daily.  ? Vitamin D, Ergocalciferol, (DRISDOL) 1.25 MG (50000 UNIT) CAPS capsule Take 1 capsule (50,000 Units total) by mouth 2 (two) times a week.  ? ?No facility-administered encounter medications on file as of 03/12/2021.  ? ? ?Observations/Objective: ?84 African-American female  in NAD. ? ?Assessment and Plan: ?1. Vaginal discharge ?-Informed patient that if she is having vaginal itching with discharge post amoxicillin, she most likely has vaginal yeast infection.  We will do one-time Diflucan pill for treatment.  Follow-up if no improvement.  Patient is agreeable to this. ?- fluconazole (DIFLUCAN) 150 MG tablet; Take 1 tablet (150 mg total) by mouth once for 1 dose.  Dispense: 1 tablet; Refill: 0 ? ? ?Follow Up Instructions: ?As needed ?  ?I discussed the assessment and treatment plan with the patient. The patient was provided an opportunity to ask questions and all were answered. The patient agreed with the plan and demonstrated an understanding of the instructions. ?  ?The patient was advised to call back or seek an in-person evaluation if the symptoms worsen or if the condition fails to improve as anticipated. ? ?I spent 7 minutes dedicated to the care of this patient on the date of this encounter to include previsit review of my chart message, face-to-face time with patient discussing diagnosis and management and post visit ordering of medication. ? ?This note has been created with Surveyor, quantity. Any transcriptional errors are unintentional. ? ?Karle Plumber, MD ? ?

## 2021-03-12 NOTE — Telephone Encounter (Signed)
Patient returned call regarding appt for sx of yeast infection. Appt noted in My Chart VV with PCP today at 3:10. Patient aware and verbalized understanding if sx worsen call back or go to UC/ ED.  ?

## 2021-03-15 ENCOUNTER — Other Ambulatory Visit (INDEPENDENT_AMBULATORY_CARE_PROVIDER_SITE_OTHER): Payer: Self-pay | Admitting: Family Medicine

## 2021-03-15 DIAGNOSIS — E559 Vitamin D deficiency, unspecified: Secondary | ICD-10-CM

## 2021-03-17 NOTE — Telephone Encounter (Signed)
Dr.Ukleja 

## 2021-03-20 ENCOUNTER — Ambulatory Visit (INDEPENDENT_AMBULATORY_CARE_PROVIDER_SITE_OTHER): Payer: No Typology Code available for payment source | Admitting: Family Medicine

## 2021-04-23 ENCOUNTER — Other Ambulatory Visit (INDEPENDENT_AMBULATORY_CARE_PROVIDER_SITE_OTHER): Payer: Self-pay | Admitting: Family Medicine

## 2021-04-23 DIAGNOSIS — E559 Vitamin D deficiency, unspecified: Secondary | ICD-10-CM

## 2021-05-01 ENCOUNTER — Other Ambulatory Visit: Payer: Self-pay | Admitting: Physician Assistant

## 2021-05-01 DIAGNOSIS — I10 Essential (primary) hypertension: Secondary | ICD-10-CM

## 2021-05-01 NOTE — Telephone Encounter (Signed)
Requested Prescriptions  ?Pending Prescriptions Disp Refills  ?? hydrochlorothiazide (HYDRODIURIL) 25 MG tablet [Pharmacy Med Name: HYDROCHLOROTHIAZIDE 25 MG TAB] 90 tablet 0  ?  Sig: TAKE 1 TABLET (25 MG TOTAL) BY MOUTH DAILY.  ?  ? Cardiovascular: Diuretics - Thiazide Passed - 05/01/2021  2:43 AM  ?  ?  Passed - Cr in normal range and within 180 days  ?  Creat  ?Date Value Ref Range Status  ?11/02/2011 0.63 0.50 - 1.10 mg/dL Final  ? ?Creatinine, Ser  ?Date Value Ref Range Status  ?02/06/2021 0.77 0.57 - 1.00 mg/dL Final  ? ?Creatinine, Urine  ?Date Value Ref Range Status  ?01/03/2014 119.8 mg/dL Final  ?  Comment:  ?  No reference range established. ?Performed at [facility] ?  ?   ?  ?  Passed - K in normal range and within 180 days  ?  Potassium  ?Date Value Ref Range Status  ?02/06/2021 4.0 3.5 - 5.2 mmol/L Final  ?   ?  ?  Passed - Na in normal range and within 180 days  ?  Sodium  ?Date Value Ref Range Status  ?02/06/2021 140 134 - 144 mmol/L Final  ?   ?  ?  Passed - Last BP in normal range  ?  BP Readings from Last 1 Encounters:  ?02/20/21 124/66  ?   ?  ?  Passed - Valid encounter within last 6 months  ?  Recent Outpatient Visits   ?      ? 1 month ago Vaginal discharge  ? [place] Ladell Pier, [place]  ? 5 months ago Vaginal discharge  ? [place], [place]  ? 10 months ago Vaginal discharge  ? [place] Ladell Pier, [place]  ? 1 year ago Essential hypertension  ? [place] Ladell Pier, MD  ? 1 year ago Need for influenza vaccination  ? [place], [facility]  ?  ?  ?Future Appointments   ?        ? In 3 months Ladell Pier, MD [place]  ?  ? ?  ?  ?  ? ?

## 2021-06-20 ENCOUNTER — Other Ambulatory Visit: Payer: Self-pay | Admitting: Internal Medicine

## 2021-06-20 ENCOUNTER — Encounter: Payer: Self-pay | Admitting: Internal Medicine

## 2021-06-24 ENCOUNTER — Encounter: Payer: Self-pay | Admitting: Physician Assistant

## 2021-06-24 ENCOUNTER — Ambulatory Visit: Payer: Self-pay | Admitting: *Deleted

## 2021-06-24 ENCOUNTER — Telehealth: Payer: No Typology Code available for payment source | Admitting: Physician Assistant

## 2021-06-24 DIAGNOSIS — J020 Streptococcal pharyngitis: Secondary | ICD-10-CM

## 2021-06-24 DIAGNOSIS — T3695XA Adverse effect of unspecified systemic antibiotic, initial encounter: Secondary | ICD-10-CM | POA: Diagnosis not present

## 2021-06-24 DIAGNOSIS — Z20818 Contact with and (suspected) exposure to other bacterial communicable diseases: Secondary | ICD-10-CM | POA: Diagnosis not present

## 2021-06-24 DIAGNOSIS — B379 Candidiasis, unspecified: Secondary | ICD-10-CM | POA: Diagnosis not present

## 2021-06-24 MED ORDER — AMOXICILLIN 500 MG PO CAPS: 500.0000 mg | ORAL_CAPSULE | Freq: Two times a day (BID) | ORAL | 0 refills | Status: AC

## 2021-06-24 MED ORDER — FLUCONAZOLE 150 MG PO TABS: 150.0000 mg | ORAL_TABLET | Freq: Once | ORAL | 0 refills | Status: AC

## 2021-06-24 NOTE — Patient Instructions (Signed)
You are going to take amoxicillin twice a day for the next 10 days.  I sent a prescription for Diflucan in the event that you do have a yeast infection from taking this antibiotic.  I encourage you to stay well-hydrated, you can use Tylenol or ibuprofen to help you with the throat pain.  I hope that you feel better soon  Kennieth Rad, PA-C Physician Assistant [facility] [email or web address]   Strep Throat, Adult Strep throat is an infection in the throat that is caused by bacteria. It is common during the cold months of the year. It mostly affects children who are 85-58 years old. However, people of all ages can get it at any time of the year. This infection spreads from person to person (is contagious) through coughing, sneezing, or having close contact. Your health care provider may use other names to describe the infection. When strep throat affects the tonsils, it is called tonsillitis. When it affects the back of the throat, it is called pharyngitis. What are the causes? This condition is caused by the Streptococcus pyogenes bacteria. What increases the risk? You are more likely to develop this condition if: You care for school-age children, or are around school-age children. Children are more likely to get strep throat and may spread it to others. You spend time in crowded places where the infection can spread easily. You have close contact with someone who has strep throat. What are the signs or symptoms? Symptoms of this condition include: Fever or chills. Redness, swelling, or pain in the tonsils or throat. Pain or difficulty when swallowing. White or yellow spots on the tonsils or throat. Tender glands in the neck and under the jaw. Bad smelling breath. Red rash all over the body. This is rare. How is this diagnosed? This condition is diagnosed by tests that check for the presence and the amount of bacteria that cause  strep throat. They are: Rapid strep test. Your throat is swabbed and checked for the presence of bacteria. Results are usually ready in minutes. Throat culture test. Your throat is swabbed. The sample is placed in a cup that allows infections to grow. Results are usually ready in 1 or 2 days. How is this treated? This condition may be treated with: Medicines that kill germs (antibiotics). Medicines that relieve pain or fever. These include: Ibuprofen or acetaminophen. Aspirin, only for people who are over the age of 67. Throat lozenges. Throat sprays. Follow these instructions at home: Medicines  Take over-the-counter and prescription medicines only as told by your health care provider. Take your antibiotic medicine as told by your health care provider. Do not stop taking the antibiotic even if you start to feel better. Eating and drinking  If you have trouble swallowing, try eating soft foods until your sore throat feels better. Drink enough fluid to keep your urine pale yellow. To help relieve pain, you may have: Warm fluids, such as soup and tea. Cold fluids, such as frozen desserts or popsicles. General instructions Gargle with a salt-water mixture 3-4 times a day or as needed. To make a salt-water mixture, completely dissolve -1 tsp (3-6 g) of salt in 1 cup (237 mL) of warm water. Get plenty of rest. Stay home from work or school until you have been taking antibiotics for 24 hours. Do not use any products that contain nicotine or tobacco. These products include cigarettes, chewing tobacco, and vaping devices, such as e-cigarettes. If you need help quitting, ask your health  care provider. It is up to you to get your test results. Ask your health care provider, or the department that is doing the test, when your results will be ready. Keep all follow-up visits. This is important. How is this prevented?  Do not share food, drinking cups, or personal items that could cause the  infection to spread to other people. Wash your hands often with soap and water for at least 20 seconds. If soap and water are not available, use hand sanitizer. Make sure that all people in your house wash their hands well. Have family members tested if they have a sore throat or fever. They may need an antibiotic if they have strep throat. Contact a health care provider if: You have swelling in your neck that keeps getting bigger. You develop a rash, cough, or earache. You cough up a thick mucus that is green, yellow-brown, or bloody. You have pain or discomfort that does not get better with medicine. Your symptoms seem to be getting worse. You have a fever. Get help right away if: You have new symptoms, such as vomiting, severe headache, stiff or painful neck, chest pain, or shortness of breath. You have severe throat pain, drooling, or changes in your voice. You have swelling of the neck, or the skin on the neck becomes red and tender. You have signs of dehydration, such as tiredness (fatigue), dry mouth, and decreased urination. You become increasingly sleepy, or you cannot wake up completely. Your joints become red or painful. These symptoms may represent a serious problem that is an emergency. Do not wait to see if the symptoms will go away. Get medical help right away. Call your local emergency services (911 in the [place] Do not drive yourself to the hospital. Summary Strep throat is an infection in the throat that is caused by the Streptococcus pyogenes bacteria. This infection is spread from person to person (is contagious) through coughing, sneezing, or having close contact. Take your medicines, including antibiotics, as told by your health care provider. Do not stop taking the antibiotic even if you start to feel better. To prevent the spread of germs, wash your hands well with soap and water. Have others do the same. Do not share food, drinking cups, or personal items. Get help  right away if you have new symptoms, such as vomiting, severe headache, stiff or painful neck, chest pain, or shortness of breath. This information is not intended to replace advice given to you by your health care provider. Make sure you discuss any questions you have with your health care provider. Document Revised: 04/23/2020 Document Reviewed: 04/23/2020 Elsevier Patient Education  [place].

## 2021-06-24 NOTE — Progress Notes (Signed)
Virtual Visit Consent   Diane [name], you are scheduled for a virtual visit with a [facility] provider today. Just as with appointments in the office, your consent must be obtained to participate. Your consent will be active for this visit and any virtual visit you may have with one of our providers in the next 365 days. If you have a MyChart account, a copy of this consent can be sent to you electronically.  As this is a virtual visit, video technology does not allow for your provider to perform a traditional examination. This may limit your provider's ability to fully assess your condition. If your provider identifies any concerns that need to be evaluated in person or the need to arrange testing (such as labs, EKG, etc.), we will make arrangements to do so. Although advances in technology are sophisticated, we cannot ensure that it will always work on either your end or our end. If the connection with a video visit is poor, the visit may have to be switched to a telephone visit. With either a video or telephone visit, we are not always able to ensure that we have a secure connection.  By engaging in this virtual visit, you consent to the provision of healthcare and authorize for your insurance to be billed (if applicable) for the services provided during this visit. Depending on your insurance coverage, you may receive a charge related to this service.  I need to obtain your verbal consent now. Are you willing to proceed with your visit today? Diane [name] has provided verbal consent on 06/24/2021 for a virtual visit (video or telephone). Kennieth Rad, PA-C  Date: 06/24/2021 2:04 PM  Virtual Visit via Video Note   I, [place], connected with  Diane [name]  ([id number], Jun 21, 1975) on 06/24/21 at  2:20 PM EDT by a video-enabled telemedicine application and verified that I am speaking with the correct person using two identifiers.  Location: Patient: Virtual Visit Location  Patient: Home Provider: Virtual Visit Location Provider: Office/Clinic   I discussed the limitations of evaluation and management by telemedicine and the availability of in person appointments. The patient expressed understanding and agreed to proceed.    History of Present Illness: Diane [name] is a 46 y.o. who identifies as a female who was assigned female at birth, and is being seen today for a sore throat.  States that she started having a sore throat with white spots on her tonsils yesterday.  States that she felt warm, but did not measure her temperature.  States that it has been difficult to drink fluids, and her appetite has been low.  States that she has been using TheraFlu with a small amount of relief.  States that her son is currently being treated for strep throat.  Review of Systems  Constitutional:  Negative for chills.  HENT:  Positive for sore throat. Negative for ear pain and sinus pain.   Eyes: Negative.   Respiratory:  Negative for cough and shortness of breath.   Cardiovascular:  Negative for chest pain.  Gastrointestinal: Negative.   Genitourinary: Negative.   Musculoskeletal:  Negative for myalgias.  Skin: Negative.   Neurological: Negative.   Endo/Heme/Allergies: Negative.   Psychiatric/Behavioral: Negative.      Problems:  Patient Active Problem List   Diagnosis Date Noted   Tobacco dependence 10/31/2019   Knee pain, right 08/09/2019   Hyperlipidemia 03/24/2018   Essential hypertension 11/18/2017   Carpal tunnel syndrome of left wrist  11/18/2017   Anxiety 06/27/2012   Preventative health care 12/08/2011   Tobacco abuse 11/02/2011   Morbid obesity ([place]) 11/02/2011    Allergies:  Allergies  Allergen Reactions   Lisinopril Swelling    Lip swelling   Medications:  Current Outpatient Medications:    ALPRAZolam (XANAX) 0.5 MG tablet, Take 0.5 mg by mouth 3 (three) times daily as needed., Disp: , Rfl:    amLODipine (NORVASC) 5 MG tablet, Take 1  tablet (5 mg total) by mouth daily., Disp: 90 tablet, Rfl: 1   atorvastatin (LIPITOR) 20 MG tablet, Take 1 tablet (20 mg total) by mouth daily., Disp: 90 tablet, Rfl: 1   buPROPion (WELLBUTRIN SR) 150 MG 12 hr tablet, Take 1 tablet (150 mg total) by mouth 2 (two) times daily., Disp: 180 tablet, Rfl: 0   hydrochlorothiazide (HYDRODIURIL) 25 MG tablet, TAKE 1 TABLET (25 MG TOTAL) BY MOUTH DAILY., Disp: 90 tablet, Rfl: 0   phentermine (ADIPEX-P) 37.5 MG tablet, Take 1 tablet by mouth daily., Disp: , Rfl:    Potassium Chloride CR (MICRO-K) 8 MEQ CPCR capsule CR, Take 1 capsule (8 mEq total) by mouth daily., Disp: 90 capsule, Rfl: 1   Vitamin D, Ergocalciferol, (DRISDOL) 1.25 MG (50000 UNIT) CAPS capsule, Take 1 capsule (50,000 Units total) by mouth 2 (two) times a week., Disp: 8 capsule, Rfl: 0  Observations/Objective: Patient is well-developed, well-nourished in no acute distress.  Resting comfortably  at home.  Head is normocephalic, atraumatic.  No labored breathing.  Speech is clear and coherent with logical content.  Patient is alert and oriented at baseline.    Assessment and Plan: There are no diagnoses linked to this encounter. 1. Pharyngitis due to Streptococcus species Given close exposure, and patient's clinical presentation, will do trial amoxicillin.  Patient education given on supportive care, red flags given for prompt reevaluation - amoxicillin (AMOXIL) 500 MG capsule; Take 1 capsule (500 mg total) by mouth 2 (two) times daily for 10 days.  Dispense: 20 capsule; Refill: 0  2. Exposure to strep throat   3. Antibiotic-induced yeast infection Trial Diflucan, encourage patient to use only if needed. - fluconazole (DIFLUCAN) 150 MG tablet; Take 1 tablet (150 mg total) by mouth once for 1 dose.  Dispense: 1 tablet; Refill: 0   Follow Up Instructions: I discussed the assessment and treatment plan with the patient. The patient was provided an opportunity to ask questions and all  were answered. The patient agreed with the plan and demonstrated an understanding of the instructions.  A copy of instructions were sent to the patient via MyChart unless otherwise noted below.     The patient was advised to call back or seek an in-person evaluation if the symptoms worsen or if the condition fails to improve as anticipated.  Time:  I spent 12 minutes with the patient via telehealth technology discussing the above problems/concerns.    Loraine Grip Mayers, PA-C

## 2021-06-24 NOTE — Telephone Encounter (Signed)
  Chief Complaint: Sore throat Symptoms: Sore throat, exposure to strep, son  Frequency: yesterday Pertinent Negatives: Patient denies cough,fever Disposition: '[]'$ ED /'[]'$ Urgent Care (no appt availability in office) / '[]'$ Appointment(In office/virtual)/ '[]'$  [place] [facility]/ '[]'$ Home Care/ '[]'$ Refused Recommended Disposition /'[x]'$ [place] [facility]/ '[]'$  Follow-up with PCP Additional Notes: [facility]. States will follow disposition. Care advise provided. Reason for Disposition  SEVERE (e.g., excruciating) throat pain  Answer Assessment - Initial Assessment Questions 1. ONSET: "When did the throat start hurting?" (Hours or days ago)      yesterday 2. SEVERITY: "How bad is the sore throat?" (Scale 1-10; mild, moderate or severe)   - MILD (1-3):  doesn't interfere with eating or normal activities   - MODERATE (4-7): interferes with eating some solids and normal activities   - SEVERE (8-10):  excruciating pain, interferes with most normal activities   - SEVERE DYSPHAGIA: can't swallow liquids, drooling     8/10 3. STREP EXPOSURE: "Has there been any exposure to strep within the past week?" If Yes, ask: "What type of contact occurred?"      Son just had strept throat 4.  VIRAL SYMPTOMS: "Are there any symptoms of a cold, such as a runny nose, cough, hoarse voice or red eyes?"      No 5. FEVER: "Do you have a fever?" If Yes, ask: "What is your temperature, how was it measured, and when did it start?"     no 6. PUS ON THE TONSILS: "Is there pus on the tonsils in the back of your throat?"     Haven't looked 7. OTHER SYMPTOMS: "Do you have any other symptoms?" (e.g., difficulty breathing, headache, rash)     no  Protocols used: Sore Throat-A-AH

## 2021-06-25 NOTE — Telephone Encounter (Signed)
Noted  

## 2021-07-23 ENCOUNTER — Other Ambulatory Visit: Payer: Self-pay | Admitting: Internal Medicine

## 2021-07-23 DIAGNOSIS — I10 Essential (primary) hypertension: Secondary | ICD-10-CM

## 2021-08-01 ENCOUNTER — Other Ambulatory Visit: Payer: Self-pay | Admitting: Internal Medicine

## 2021-08-01 DIAGNOSIS — I10 Essential (primary) hypertension: Secondary | ICD-10-CM

## 2021-08-06 ENCOUNTER — Other Ambulatory Visit (INDEPENDENT_AMBULATORY_CARE_PROVIDER_SITE_OTHER): Payer: Self-pay | Admitting: Family Medicine

## 2021-08-06 DIAGNOSIS — E559 Vitamin D deficiency, unspecified: Secondary | ICD-10-CM

## 2021-08-08 ENCOUNTER — Ambulatory Visit: Payer: No Typology Code available for payment source | Attending: Internal Medicine | Admitting: Internal Medicine

## 2021-08-08 VITALS — BP 119/79 | HR 80 | Temp 98.4°F | Ht 59.0 in | Wt 234.8 lb

## 2021-08-08 DIAGNOSIS — I10 Essential (primary) hypertension: Secondary | ICD-10-CM

## 2021-08-08 DIAGNOSIS — F17200 Nicotine dependence, unspecified, uncomplicated: Secondary | ICD-10-CM | POA: Diagnosis not present

## 2021-08-08 DIAGNOSIS — G5603 Carpal tunnel syndrome, bilateral upper limbs: Secondary | ICD-10-CM

## 2021-08-08 DIAGNOSIS — E785 Hyperlipidemia, unspecified: Secondary | ICD-10-CM | POA: Diagnosis not present

## 2021-08-08 MED ORDER — ATORVASTATIN CALCIUM 20 MG PO TABS: 20.0000 mg | ORAL_TABLET | Freq: Every day | ORAL | 1 refills | Status: DC

## 2021-08-08 MED ORDER — WEGOVY 0.25 MG/0.5ML ~~LOC~~ SOAJ: 0.2500 mg | SUBCUTANEOUS | 2 refills | Status: DC

## 2021-08-08 MED ORDER — AMLODIPINE BESYLATE 2.5 MG PO TABS: 2.5000 mg | ORAL_TABLET | Freq: Every day | ORAL | 1 refills | Status: DC

## 2021-08-08 NOTE — Progress Notes (Signed)
Patient ID: Diane [name], female    DOB: 09/11/1975  MRN: [id number]  CC: Hypertension   Subjective: Diane [name] is a 46 y.o. female who presents for chronic ds management Her concerns today include:  Patient with history of tob dep, morbid obesity, preDM, HTN, HL, CTS and anxiety  HTN:  out of both BP meds x 2 wks.  She was on Norvasc 10 mg and HCTZ 25 mg daily Not checking BP.  No device Limits salt in foods  HL:  out of Lipitor x 2 wks  HM:  Gets MMG at through her gyn at [facility].  Last one done 09/12/2021 and last pap was also done same day.  She was able to access her MyChart for this practice on her phone while in the exam room with me today to confirm these dates.  I have updated her health maintenance with this information.  Tob dep:  still smoking about less than 1/2 pk a day.  It is a stress reliever for her.  Not mentally ready to quit.  Obesity:  was going to [facility] but had stopped due to lack of transportation Was on Wegovy through her GYN for 2 mths earlier this year and found it helpful in decreasing her appetite but they will not refill because she owns a bill Tries to stay busy.  Walks around a track 4 x a wk while her son is at Patent attorney.  About 1 mile each time Works from home.  Eats about 2 x day.  Does not drink any sugary drinks.  Diagnosed with carpal tunnel syndrome in the past.  Right hand has been bothering her recently.  Waking up at night with numbness and tingling in the hand.  She just recently bought a cock-up wrist splint a few days ago and has started using it.  She finds it helpful.  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  Patient Active Problem List   Diagnosis Date Noted   Tobacco dependence 10/31/2019   Knee pain, right 08/09/2019   Hyperlipidemia 03/24/2018   Essential hypertension 11/18/2017   Carpal tunnel syndrome of left wrist 11/18/2017   Anxiety 06/27/2012    Preventative health care 12/08/2011   Tobacco abuse 11/02/2011   Morbid obesity ([place]) 11/02/2011     Current Outpatient Medications on File Prior to Visit  Medication Sig Dispense Refill   ALPRAZolam (XANAX) 0.5 MG tablet Take 0.5 mg by mouth 3 (three) times daily as needed.     buPROPion (WELLBUTRIN SR) 150 MG 12 hr tablet Take 1 tablet (150 mg total) by mouth 2 (two) times daily. 180 tablet 0   Potassium Chloride CR (MICRO-K) 8 MEQ CPCR capsule CR Take 1 capsule (8 mEq total) by mouth daily. 90 capsule 1   Vitamin D, Ergocalciferol, (DRISDOL) 1.25 MG (50000 UNIT) CAPS capsule Take 1 capsule (50,000 Units total) by mouth 2 (two) times a week. (Patient not taking: Reported on 08/08/2021) 8 capsule 0   No current facility-administered medications on file prior to visit.    Allergies  Allergen Reactions   Lisinopril Swelling    Lip swelling    Social History   Socioeconomic History   Marital status: Single    Spouse name: Not on file   Number of children: 4   Years of education: Not on file   Highest education level: Not on file  Occupational History   Occupation: [employer]  Tobacco Use  Smoking status: Every Day    Packs/day: 0.50    Years: 15.00    Total pack years: 7.50    Types: Cigarettes   Smokeless tobacco: Current   Tobacco comments:    pt trying to quit down to 3 -4 cig's daily from 1ppd  Substance and Sexual Activity   Alcohol use: Yes    Comment:  occasionally   Drug use: No   Sexual activity: Yes    Birth control/protection: Surgical  Other Topics Concern   Not on file  Social History Narrative   [employer]- works from home.     3 children- (48 year old adopted child) 50, 4 and 1.    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)   Single   Lives with sister and children   Social Determinants of Health   Financial Resource Strain: Not on file  Food Insecurity: Not on file  Transportation Needs: Not on file  Physical Activity: Not on file  Stress:  Not on file  Social Connections: Not on file  Intimate Partner Violence: Not on file    Family History  Problem Relation Age of Onset   Hyperlipidemia Mother    Cancer Mother        throat cancer   Hypertension Mother    Throat cancer Mother    Anxiety disorder Mother    Bipolar disorder Mother    Alcoholism Mother    Drug abuse Mother    Alcoholism Father    Hyperlipidemia Father    Hypertension Father    Heart disease Father 48   Stroke Father    Obesity Father    Diabetes Maternal Grandmother    Cancer Maternal Aunt        breast cancer   Cancer Maternal Aunt        breast cancer--in remision   Breast cancer Maternal Aunt    Pancreatic cancer Maternal Aunt    Cancer Maternal Uncle        prostate   Colon cancer Maternal Uncle    Cancer Maternal Uncle        throat cancer   Kidney disease Neg Hx     Past Surgical History:  Procedure Laterality Date   CESAREAN SECTION  02-04-2010;  11-30-2006;  07-12-2001   DILITATION & CURRETTAGE/HYSTROSCOPY WITH HYDROTHERMAL ABLATION N/A 10/01/2016   Procedure: DILATATION & CURETTAGE/HYSTEROSCOPY WITH HYDROTHERMAL ABLATION;  Surgeon: Dian Queen, MD;  Location: [facility];  Service: Gynecology;  Laterality: N/A;   LAPAROSCOPIC TUBAL LIGATION Bilateral 03/02/2014   Procedure: BILATERAL LAPAROSCOPIC TUBAL LIGATION;  Surgeon: Cyril Mourning, MD;  Location: [place];  Service: Gynecology;  Laterality: Bilateral;   TUBAL LIGATION  2014   WISDOM TOOTH EXTRACTION      ROS: Review of Systems Negative except as stated above  PHYSICAL EXAM: BP 119/79   Pulse 80   Temp 98.4 F (36.9 C) (Oral)   Ht '4\' 11"'$  (1.499 m)   Wt 234 lb 12.8 oz (106.5 kg)   SpO2 99%   BMI 47.42 kg/m   Wt Readings from Last 3 Encounters:  08/08/21 234 lb 12.8 oz (106.5 kg)  02/20/21 225 lb (102.1 kg)  02/06/21 227 lb (103 kg)   Repeat blood pressure 133/85 Physical Exam  General appearance - alert, well appearing, morbidly obese  African-American female and in no distress Mental status - normal mood, behavior, speech, dress, motor activity, and thought processes Chest - clear to auscultation, no wheezes, rales or rhonchi, symmetric  air entry Heart - normal rate, regular rhythm, normal S1, S2, no murmurs, rubs, clicks or gallops Musculoskeletal -no intrinsic muscle wasting of the hands. Extremities - peripheral pulses normal, no pedal edema, no clubbing or cyanosis      Latest Ref Rng & Units 02/06/2021    9:30 AM 11/06/2020    3:22 PM 02/17/2019    2:54 PM  CMP  Glucose 70 - 99 mg/dL 75  77  73   BUN 6 - 24 mg/dL '14  13  9   '$ Creatinine 0.57 - 1.00 mg/dL 0.77  0.78  0.74   Sodium 134 - 144 mmol/L 140  138  140   Potassium 3.5 - 5.2 mmol/L 4.0  3.7  3.4   Chloride 96 - 106 mmol/L 99  98  100   CO2 20 - 29 mmol/L '28  27  25   '$ Calcium 8.7 - 10.2 mg/dL 9.3  9.0  8.8   Total Protein 6.0 - 8.5 g/dL 7.5  7.7  7.4   Total Bilirubin 0.0 - 1.2 mg/dL 0.3  <0.2  <0.2   Alkaline Phos 44 - 121 IU/L 77  70  70   AST 0 - 40 IU/L '18  21  19   '$ ALT 0 - 32 IU/L '21  16  16    '$ Lipid Panel     Component Value Date/Time   CHOL 165 02/06/2021 0930   TRIG 72 02/06/2021 0930   HDL 55 02/06/2021 0930   CHOLHDL 3.7 10/31/2019 1113   CHOLHDL 4.7 11/02/2011 1051   VLDL 16 11/02/2011 1051   LDLCALC 96 02/06/2021 0930    CBC    Component Value Date/Time   WBC 4.9 02/06/2021 0930   WBC 8.0 02/16/2018 1829   RBC 4.43 02/06/2021 0930   RBC 4.73 02/16/2018 1829   HGB 13.5 02/06/2021 0930   HCT 40.7 02/06/2021 0930   PLT 320 02/06/2021 0930   MCV 92 02/06/2021 0930   MCH 30.5 02/06/2021 0930   MCH 29.8 02/16/2018 1829   MCHC 33.2 02/06/2021 0930   MCHC 32.3 02/16/2018 1829   RDW 12.6 02/06/2021 0930   LYMPHSABS 1.3 02/06/2021 0930   MONOABS 0.3 02/16/2018 1829   EOSABS 0.1 02/06/2021 0930   BASOSABS 0.0 02/06/2021 0930    ASSESSMENT AND PLAN: 1. Essential hypertension Patient has been out of both Norvasc and HCTZ for 2  weeks per her report.  Blood pressure does not look bad.  At this point I will only restart Norvasc and at a lower dose.  DASH diet encouraged. - amLODipine (NORVASC) 2.5 MG tablet; Take 1 tablet (2.5 mg total) by mouth daily.  Dispense: 90 tablet; Refill: 1  2. Hyperlipidemia, unspecified hyperlipidemia type Refill given on atorvastatin.  Last LDL was 96 in January of this year which is good. - atorvastatin (LIPITOR) 20 MG tablet; Take 1 tablet (20 mg total) by mouth daily.  Dispense: 90 tablet; Refill: 1  3. Tobacco dependence Advised to quit.  Discussed health risks associated with smoking.  4. Morbid obesity ([place]) Patient advised to eliminate sugary drinks from the diet, cut back on portion sizes especially of white carbohydrates, eat more white lean meat like chicken [place] and seafood instead of beef or pork and incorporate fresh fruits and vegetables into the diet daily. Encouraged her to continue walking with the goal of getting in at least 30 minutes 3 to 5 days a week. Discussed restarting her on Wegovy.  She is agreeable to this.  I went over with her how the medication works and possible side effects.  Advised that if she develops any vomiting or pain in the upper abdomen she should stop the medication and come in.  No personal or family history of medullary thyroid cancer.  I will have her follow-up with me in about 6 weeks to see how she is doing on the medicine - Semaglutide-Weight Management (WEGOVY) 0.25 MG/0.5ML SOAJ; Inject 0.25 mg into the skin once a week.  Dispense: 2 mL; Refill: 2  5. Bilateral carpal tunnel syndrome Discussed diagnoses.  Encouraged her to continue using the cock-up wrist splints.  If symptoms progress, we can refer her to orthopedics.     Patient was given the opportunity to ask questions.  Patient verbalized understanding of the plan and was able to repeat key elements of the plan.   This documentation was completed using Management consultant.  Any transcriptional errors are unintentional.  No orders of the defined types were placed in this encounter.    Requested Prescriptions   Signed Prescriptions Disp Refills   Semaglutide-Weight Management (WEGOVY) 0.25 MG/0.5ML SOAJ 2 mL 2    Sig: Inject 0.25 mg into the skin once a week.   atorvastatin (LIPITOR) 20 MG tablet 90 tablet 1    Sig: Take 1 tablet (20 mg total) by mouth daily.   amLODipine (NORVASC) 2.5 MG tablet 90 tablet 1    Sig: Take 1 tablet (2.5 mg total) by mouth daily.    Return in about 6 weeks (around 09/19/2021).  Karle Plumber, MD, FACP

## 2021-08-11 ENCOUNTER — Other Ambulatory Visit: Payer: Self-pay | Admitting: Internal Medicine

## 2021-08-12 NOTE — Telephone Encounter (Signed)
Med backordered Requested Prescriptions  Pending Prescriptions Disp Refills   WEGOVY 0.25 MG/0.5ML SOAJ [Pharmacy Med Name: WEGOVY 0.25 MG/0.5 ML PEN]  2    Sig: INJECT 0.'25MG'$  INTO THE SKIN ONE TIME PER WEEK     Endocrinology:  Diabetes - GLP-1 Receptor Agonists - semaglutide Failed - 08/11/2021 12:17 PM      Failed - HBA1C in normal range and within 180 days    Hgb A1c MFr Bld  Date Value Ref Range Status  02/06/2021 5.8 (H) 4.8 - 5.6 % Final    Comment:             Prediabetes: 5.7 - 6.4          Diabetes: >6.4          Glycemic control for adults with diabetes: <7.0          Passed - Cr in normal range and within 360 days    Creat  Date Value Ref Range Status  11/02/2011 0.63 0.50 - 1.10 mg/dL Final   Creatinine, Ser  Date Value Ref Range Status  02/06/2021 0.77 0.57 - 1.00 mg/dL Final   Creatinine, Urine  Date Value Ref Range Status  01/03/2014 119.8 mg/dL Final    Comment:    No reference range established. Performed at [facility] encounter within last 6 months    Recent Outpatient Visits           4 days ago Essential hypertension   [place], [place]   5 months ago Vaginal discharge   [place], [place]   9 months ago Vaginal discharge   [place], [place]   1 year ago Vaginal discharge   [place] Ladell Pier, MD   1 year ago Essential hypertension   [place] Ladell Pier, MD

## 2021-08-20 ENCOUNTER — Encounter (INDEPENDENT_AMBULATORY_CARE_PROVIDER_SITE_OTHER): Payer: Self-pay

## 2021-08-27 ENCOUNTER — Encounter: Payer: Self-pay | Admitting: Internal Medicine

## 2021-08-28 ENCOUNTER — Other Ambulatory Visit: Payer: Self-pay | Admitting: Internal Medicine

## 2021-08-28 MED ORDER — WEGOVY 0.5 MG/0.5ML ~~LOC~~ SOAJ: 0.5000 mg | SUBCUTANEOUS | 1 refills | Status: DC

## 2021-08-28 NOTE — Addendum Note (Signed)
Addended by: Daisy Blossom, Annie Main L on: 08/28/2021 08:32 AM   Modules accepted: Orders

## 2021-08-28 NOTE — Telephone Encounter (Signed)
Diane [name] can you address and adjust RX?

## 2021-08-29 NOTE — Telephone Encounter (Signed)
Requested medication (s) are due for refill today: no  Requested medication (s) are on the active medication list: yes  Last refill:  yesterday  Future visit scheduled: no  Notes to clinic:  Product Backordered/Unavailable:THIS DOSE IS ON LONG TERM BACK ORDER.     Requested Prescriptions  Pending Prescriptions Disp Refills   WEGOVY 0.5 MG/0.5ML SOAJ [Pharmacy Med Name: WEGOVY 0.5 MG/0.5 ML PEN]  1    Sig: Inject 0.5 mg into the skin once a week.     Endocrinology:  Diabetes - GLP-1 Receptor Agonists - semaglutide Failed - 08/28/2021  6:28 PM      Failed - HBA1C in normal range and within 180 days    Hgb A1c MFr Bld  Date Value Ref Range Status  02/06/2021 5.8 (H) 4.8 - 5.6 % Final    Comment:             Prediabetes: 5.7 - 6.4          Diabetes: >6.4          Glycemic control for adults with diabetes: <7.0          Passed - Cr in normal range and within 360 days    Creat  Date Value Ref Range Status  11/02/2011 0.63 0.50 - 1.10 mg/dL Final   Creatinine, Ser  Date Value Ref Range Status  02/06/2021 0.77 0.57 - 1.00 mg/dL Final   Creatinine, Urine  Date Value Ref Range Status  01/03/2014 119.8 mg/dL Final    Comment:    No reference range established. Performed at [facility] - Valid encounter within last 6 months    Recent Outpatient Visits           3 weeks ago Essential hypertension   [place], [place]   5 months ago Vaginal discharge   [place], [place]   9 months ago Vaginal discharge   [place], [place]   1 year ago Vaginal discharge   [place] Ladell Pier, MD   1 year ago Essential hypertension   [place] Ladell Pier, MD

## 2021-09-02 ENCOUNTER — Encounter: Payer: Self-pay | Admitting: Internal Medicine

## 2021-09-02 ENCOUNTER — Other Ambulatory Visit: Payer: Self-pay | Admitting: Internal Medicine

## 2021-09-02 MED ORDER — HYDROCHLOROTHIAZIDE 12.5 MG PO TABS: 12.5000 mg | ORAL_TABLET | Freq: Every day | ORAL | 3 refills | Status: DC

## 2021-09-12 ENCOUNTER — Encounter: Payer: Self-pay | Admitting: Internal Medicine

## 2021-09-17 ENCOUNTER — Other Ambulatory Visit: Payer: Self-pay | Admitting: Internal Medicine

## 2021-09-17 MED ORDER — PHENTERMINE HCL 30 MG PO CAPS: 30.0000 mg | ORAL_CAPSULE | ORAL | 0 refills | Status: DC

## 2021-10-20 ENCOUNTER — Other Ambulatory Visit: Payer: Self-pay | Admitting: Internal Medicine

## 2021-10-20 ENCOUNTER — Encounter: Payer: Self-pay | Admitting: Internal Medicine

## 2021-10-25 ENCOUNTER — Other Ambulatory Visit: Payer: Self-pay | Admitting: Internal Medicine

## 2021-10-25 MED ORDER — PHENTERMINE HCL 37.5 MG PO CAPS
37.5000 mg | ORAL_CAPSULE | ORAL | 0 refills | Status: DC
Start: 2021-10-25 — End: 2021-11-26

## 2021-11-21 ENCOUNTER — Encounter: Payer: Self-pay | Admitting: Internal Medicine

## 2021-11-24 ENCOUNTER — Other Ambulatory Visit: Payer: Self-pay | Admitting: Internal Medicine

## 2021-11-25 NOTE — Telephone Encounter (Signed)
Requested medication (s) are due for refill today - provider review   Requested medication (s) are on the active medication list -yes  Future visit scheduled -yes  Last refill: 10/25/21 #30  Notes to clinic: non delegated Rx  Requested Prescriptions  Pending Prescriptions Disp Refills   phentermine 37.5 MG capsule [Pharmacy Med Name: PHENTERMINE 37.5 MG CAPSULE] 30 capsule 0    Sig: TAKE 1 CAPSULE BY MOUTH EVERY MORNING     Not Delegated - Neurology: Anticonvulsants - Controlled - phentermine hydrochloride Failed - 11/24/2021  1:48 PM      Failed - This refill cannot be delegated      Failed - Weight completed in the last 3 months    Wt Readings from Last 1 Encounters:  08/08/21 234 lb 12.8 oz (106.5 kg)         Passed - eGFR in normal range and within 360 days    GFR, Est African American  Date Value Ref Range Status  11/02/2011 >89 mL/min Final   GFR calc Af Amer  Date Value Ref Range Status  02/17/2019 114 >59 mL/min/1.73 Final   GFR, Est Non African American  Date Value Ref Range Status  11/02/2011 >89 mL/min Final    Comment:      The estimated GFR is a calculation valid for adults (50 to 47 years old) that uses the CKD-EPI algorithm to adjust for age and sex. It is not to be used for children, pregnant women, hospitalized patients, patients on dialysis, or with rapidly changing kidney function. According to the NKDEP, eGFR >89 is normal, 60-89 shows mild impairment, 30-59 shows moderate impairment, 15-29 shows severe impairment and <15 is ESRD.   GFR calc non Af Amer  Date Value Ref Range Status  02/17/2019 99 >59 mL/min/1.73 Final   eGFR  Date Value Ref Range Status  02/06/2021 96 >59 mL/min/1.73 Final         Passed - Cr in normal range and within 360 days    Creat  Date Value Ref Range Status  11/02/2011 0.63 0.50 - 1.10 mg/dL Final   Creatinine, Ser  Date Value Ref Range Status  02/06/2021 0.77 0.57 - 1.00 mg/dL Final   Creatinine, Urine   Date Value Ref Range Status  01/03/2014 119.8 mg/dL Final    Comment:    No reference range established. Performed at [facility] - Last BP in normal range    BP Readings from Last 1 Encounters:  08/08/21 119/79         Passed - Valid encounter within last 6 months    Recent Outpatient Visits           3 months ago Essential hypertension   [place], [place]   8 months ago Vaginal discharge   [place], [place]   1 year ago Vaginal discharge   [place], [place]   1 year ago Vaginal discharge   [place], Deborah B, MD   1 year ago Essential hypertension   [place], MD       Future Appointments             In 3 days Ladell Pier, MD [place]  Requested Prescriptions  Pending Prescriptions Disp Refills   phentermine 37.5 MG capsule [Pharmacy Med Name: PHENTERMINE 37.5 MG CAPSULE] 30 capsule 0    Sig: TAKE 1 CAPSULE BY MOUTH EVERY MORNING     Not Delegated - Neurology: Anticonvulsants - Controlled - phentermine hydrochloride Failed - 11/24/2021  1:48 PM      Failed - This refill cannot be delegated      Failed - Weight completed in the last 3 months    Wt Readings from Last 1 Encounters:  08/08/21 234 lb 12.8 oz (106.5 kg)         Passed - eGFR in normal range and within 360 days    GFR, Est African American  Date Value Ref Range Status  11/02/2011 >89 mL/min Final   GFR calc Af Amer  Date Value Ref Range Status  02/17/2019 114 >59 mL/min/1.73 Final   GFR, Est Non African American  Date Value Ref Range Status  11/02/2011 >89 mL/min Final    Comment:      The estimated GFR is a calculation valid for adults (7 to 47 years old) that uses the  CKD-EPI algorithm to adjust for age and sex. It is not to be used for children, pregnant women, hospitalized patients, patients on dialysis, or with rapidly changing kidney function. According to the NKDEP, eGFR >89 is normal, 60-89 shows mild impairment, 30-59 shows moderate impairment, 15-29 shows severe impairment and <15 is ESRD.   GFR calc non Af Amer  Date Value Ref Range Status  02/17/2019 99 >59 mL/min/1.73 Final   eGFR  Date Value Ref Range Status  02/06/2021 96 >59 mL/min/1.73 Final         Passed - Cr in normal range and within 360 days    Creat  Date Value Ref Range Status  11/02/2011 0.63 0.50 - 1.10 mg/dL Final   Creatinine, Ser  Date Value Ref Range Status  02/06/2021 0.77 0.57 - 1.00 mg/dL Final   Creatinine, Urine  Date Value Ref Range Status  01/03/2014 119.8 mg/dL Final    Comment:    No reference range established. Performed at [facility] - Last BP in normal range    BP Readings from Last 1 Encounters:  08/08/21 119/79         Passed - Valid encounter within last 6 months    Recent Outpatient Visits           3 months ago Essential hypertension   [place], [place]   8 months ago Vaginal discharge   [place], MD   1 year ago Vaginal discharge   [place], [place]   1 year ago Vaginal discharge   [place], Deborah B, MD   1 year ago Essential hypertension   [place], MD       Future Appointments             In 3 days Ladell Pier, MD [place]

## 2021-11-28 ENCOUNTER — Ambulatory Visit: Payer: No Typology Code available for payment source | Admitting: Internal Medicine

## 2021-12-01 ENCOUNTER — Encounter: Payer: Self-pay | Admitting: Internal Medicine

## 2021-12-29 ENCOUNTER — Other Ambulatory Visit: Payer: Self-pay | Admitting: Pharmacist

## 2021-12-29 ENCOUNTER — Ambulatory Visit: Payer: No Typology Code available for payment source | Attending: Internal Medicine | Admitting: Internal Medicine

## 2021-12-29 ENCOUNTER — Encounter: Payer: Self-pay | Admitting: Internal Medicine

## 2021-12-29 ENCOUNTER — Other Ambulatory Visit: Payer: Self-pay

## 2021-12-29 VITALS — BP 138/82 | HR 78 | Temp 98.4°F | Ht 59.0 in | Wt 232.0 lb

## 2021-12-29 DIAGNOSIS — F17200 Nicotine dependence, unspecified, uncomplicated: Secondary | ICD-10-CM

## 2021-12-29 DIAGNOSIS — I10 Essential (primary) hypertension: Secondary | ICD-10-CM | POA: Diagnosis present

## 2021-12-29 DIAGNOSIS — Z716 Tobacco abuse counseling: Secondary | ICD-10-CM | POA: Insufficient documentation

## 2021-12-29 DIAGNOSIS — G5603 Carpal tunnel syndrome, bilateral upper limbs: Secondary | ICD-10-CM | POA: Insufficient documentation

## 2021-12-29 DIAGNOSIS — F419 Anxiety disorder, unspecified: Secondary | ICD-10-CM | POA: Diagnosis not present

## 2021-12-29 DIAGNOSIS — F17210 Nicotine dependence, cigarettes, uncomplicated: Secondary | ICD-10-CM | POA: Insufficient documentation

## 2021-12-29 DIAGNOSIS — Z23 Encounter for immunization: Secondary | ICD-10-CM | POA: Insufficient documentation

## 2021-12-29 DIAGNOSIS — E785 Hyperlipidemia, unspecified: Secondary | ICD-10-CM | POA: Insufficient documentation

## 2021-12-29 DIAGNOSIS — R7303 Prediabetes: Secondary | ICD-10-CM | POA: Insufficient documentation

## 2021-12-29 DIAGNOSIS — Z79899 Other long term (current) drug therapy: Secondary | ICD-10-CM | POA: Diagnosis not present

## 2021-12-29 DIAGNOSIS — Z6841 Body Mass Index (BMI) 40.0 and over, adult: Secondary | ICD-10-CM | POA: Diagnosis not present

## 2021-12-29 MED ORDER — WEGOVY 0.25 MG/0.5ML ~~LOC~~ SOAJ: 0.2500 mg | SUBCUTANEOUS | 1 refills | Status: DC

## 2021-12-29 MED ORDER — AMLODIPINE BESYLATE 5 MG PO TABS: 5.0000 mg | ORAL_TABLET | Freq: Every day | ORAL | 1 refills | Status: DC

## 2021-12-29 MED ORDER — VARENICLINE TARTRATE (STARTER) 0.5 MG X 11 & 1 MG X 42 PO TBPK: ORAL_TABLET | ORAL | 0 refills | Status: DC

## 2021-12-29 MED ORDER — WEGOVY 0.25 MG/0.5ML ~~LOC~~ SOAJ
0.2500 mg | SUBCUTANEOUS | 1 refills | Status: DC
  Filled 2021-12-29: qty 2, 28d supply, fill #0

## 2021-12-29 NOTE — Progress Notes (Addendum)
Patient ID: Diane [name], female    DOB: 05-05-1975  MRN: [id number]  CC: Hypertension (Htn f/u. Med refill./Unable to find Wegovy at pharmacy. [facility] 1.7 Wegovy rx./Numbness on fingers X1 yr/Yes to flu vax.)   Subjective: Diane [name] is a 46 y.o. female who presents for chronic ds management. Her concerns today include:  Patient with history of tob dep, morbid obesity, preDM, HTN, HL, CTS and anxiety   HTN: On last visit patient was restarted on amlodipine but at a lower dose of 2.5 mg daily. Continued HCTZ 12.5 mg daily.  Took already for today. Reports compliance with taking the medications and low-salt diet. No device to check BP No CP/SOB  C/o intermittent numbness in fingers for a yr.  Entire fingers both hands.  Wakes her up at nights. 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  Has intermittent numbness in the hands throughout the day. Dx with CTS LT several yrs ago.  Never got a wrist.  HL: Reports compliance with taking Lipitor 20 mg daily,  Obesity: Started on Wegovy on last visit.  Was not able to get it at all because pharmacy did not have it.  She sent me a MyChart message when we placed on phentermine instead. Feels it has not been working as well as when she was on it before.  Did not curb appetite as much Does a lot of walking.  Cleans a mulit level building Mon-Sat. Would like to get Diane [name], thinks she can get through our pharmacy.    She has smoked about half a pack of cigarettes a day for the past 15 years.  She is ready to quit.  She has tried the nicotine patches in the past and did not find them helpful.  She also tried Chantix in the past but states she did not really give it a good trial.  She is willing to try it again.    HM: Agrees to have the flu vaccine.  Patient Active Problem List   Diagnosis Date Noted   Tobacco dependence 10/31/2019   Knee pain, right 08/09/2019    Hyperlipidemia 03/24/2018   Essential hypertension 11/18/2017   Carpal tunnel syndrome of left wrist 11/18/2017   Anxiety 06/27/2012   Preventative health care 12/08/2011   Tobacco abuse 11/02/2011   Morbid obesity ([place]) 11/02/2011     Current Outpatient Medications on File Prior to Visit  Medication Sig Dispense Refill   ALPRAZolam (XANAX) 0.5 MG tablet Take 0.5 mg by mouth 3 (three) times daily as needed.     amLODipine (NORVASC) 2.5 MG tablet Take 1 tablet (2.5 mg total) by mouth daily. 90 tablet 1   atorvastatin (LIPITOR) 20 MG tablet Take 1 tablet (20 mg total) by mouth daily. 90 tablet 1   buPROPion (WELLBUTRIN SR) 150 MG 12 hr tablet Take 1 tablet (150 mg total) by mouth 2 (two) times daily. 180 tablet 0   hydrochlorothiazide (HYDRODIURIL) 12.5 MG tablet TAKE 1 TABLET BY MOUTH EVERY DAY 90 tablet 1   phentermine 37.5 MG capsule Take 1 capsule (37.5 mg total) by mouth every morning. Needs to be seen prior to next refill request. 30 capsule 0   Semaglutide-Weight Management (WEGOVY) 0.5 MG/0.5ML SOAJ Inject 0.5 mg into the skin once a week. 2 mL 1   Vitamin D, Ergocalciferol, (DRISDOL) 1.25 MG (50000 UNIT) CAPS capsule Take 1 capsule (50,000 Units total) by mouth 2 (two) times  a week. 8 capsule 0   Potassium Chloride CR (MICRO-K) 8 MEQ CPCR capsule CR Take 1 capsule (8 mEq total) by mouth daily. (Patient not taking: Reported on 12/29/2021) 90 capsule 1   No current facility-administered medications on file prior to visit.    Allergies  Allergen Reactions   Lisinopril Swelling    Lip swelling    Social History   Socioeconomic History   Marital status: Single    Spouse name: Not on file   Number of children: 4   Years of education: Not on file   Highest education level: Not on file  Occupational History   Occupation: [employer]  Tobacco Use   Smoking status: Every Day    Packs/day: 0.50    Years: 15.00    Total pack years: 7.50    Types: Cigarettes   Smokeless tobacco:  Current   Tobacco comments:    pt trying to quit down to 3 -4 cig's daily from 1ppd  Substance and Sexual Activity   Alcohol use: Yes    Comment:  occasionally   Drug use: No   Sexual activity: Yes    Birth control/protection: Surgical  Other Topics Concern   Not on file  Social History Narrative   [employer]- works from home.     3 children- (32 year old adopted child) 63, 4 and 1.    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)   Single   Lives with sister and children   Social Determinants of Health   Financial Resource Strain: Not on file  Food Insecurity: Not on file  Transportation Needs: Not on file  Physical Activity: Not on file  Stress: Not on file  Social Connections: Not on file  Intimate Partner Violence: Not on file    Family History  Problem Relation Age of Onset   Hyperlipidemia Mother    Cancer Mother        throat cancer   Hypertension Mother    Throat cancer Mother    Anxiety disorder Mother    Bipolar disorder Mother    Alcoholism Mother    Drug abuse Mother    Alcoholism Father    Hyperlipidemia Father    Hypertension Father    Heart disease Father 24   Stroke Father    Obesity Father    Diabetes Maternal Grandmother    Cancer Maternal Aunt        breast cancer   Cancer Maternal Aunt        breast cancer--in remision   Breast cancer Maternal Aunt    Pancreatic cancer Maternal Aunt    Cancer Maternal Uncle        prostate   Colon cancer Maternal Uncle    Cancer Maternal Uncle        throat cancer   Kidney disease Neg Hx     Past Surgical History:  Procedure Laterality Date   CESAREAN SECTION  02-04-2010;  11-30-2006;  07-12-2001   DILITATION & CURRETTAGE/HYSTROSCOPY WITH HYDROTHERMAL ABLATION N/A 10/01/2016   Procedure: DILATATION & CURETTAGE/HYSTEROSCOPY WITH HYDROTHERMAL ABLATION;  Surgeon: Dian Queen, MD;  Location: [facility];  Service: Gynecology;  Laterality: N/A;   LAPAROSCOPIC TUBAL LIGATION Bilateral 03/02/2014    Procedure: BILATERAL LAPAROSCOPIC TUBAL LIGATION;  Surgeon: Cyril Mourning, MD;  Location: [place];  Service: Gynecology;  Laterality: Bilateral;   TUBAL LIGATION  2014   WISDOM TOOTH EXTRACTION      ROS: Review of Systems Negative except as stated above  PHYSICAL EXAM: BP (!) 138/92 (BP Location: Left Arm, Patient Position: Sitting, Cuff Size: Large)   Pulse 78   Temp 98.4 F (36.9 C) (Oral)   Ht '4\' 11"'$  (1.499 m)   Wt 232 lb (105.2 kg)   SpO2 99%   BMI 46.86 kg/m   Wt Readings from Last 3 Encounters:  12/29/21 232 lb (105.2 kg)  08/08/21 234 lb 12.8 oz (106.5 kg)  02/20/21 225 lb (102.1 kg)    Physical Exam  General appearance - alert, well appearing, morbidly obese middle-aged African-American female and in no distress Mental status - normal mood, behavior, speech, dress, motor activity, and thought processes Neck - supple, no significant adenopathy Chest - clear to auscultation, no wheezes, rales or rhonchi, symmetric air entry Heart - normal rate, regular rhythm, normal S1, S2, no murmurs, rubs, clicks or gallops Extremities - peripheral pulses normal, no pedal edema, no clubbing or cyanosis Neuro: No muscle wasting noted in the hands.  Grip 5/5 bilaterally.  Tinel's sign negative.     Latest Ref Rng & Units 02/06/2021    9:30 AM 11/06/2020    3:22 PM 02/17/2019    2:54 PM  CMP  Glucose 70 - 99 mg/dL 75  77  73   BUN 6 - 24 mg/dL '14  13  9   '$ Creatinine 0.57 - 1.00 mg/dL 0.77  0.78  0.74   Sodium 134 - 144 mmol/L 140  138  140   Potassium 3.5 - 5.2 mmol/L 4.0  3.7  3.4   Chloride 96 - 106 mmol/L 99  98  100   CO2 20 - 29 mmol/L '28  27  25   '$ Calcium 8.7 - 10.2 mg/dL 9.3  9.0  8.8   Total Protein 6.0 - 8.5 g/dL 7.5  7.7  7.4   Total Bilirubin 0.0 - 1.2 mg/dL 0.3  <0.2  <0.2   Alkaline Phos 44 - 121 IU/L 77  70  70   AST 0 - 40 IU/L '18  21  19   '$ ALT 0 - 32 IU/L '21  16  16    '$ Lipid Panel     Component Value Date/Time   CHOL 165 02/06/2021  0930   TRIG 72 02/06/2021 0930   HDL 55 02/06/2021 0930   CHOLHDL 3.7 10/31/2019 1113   CHOLHDL 4.7 11/02/2011 1051   VLDL 16 11/02/2011 1051   LDLCALC 96 02/06/2021 0930    CBC    Component Value Date/Time   WBC 4.9 02/06/2021 0930   WBC 8.0 02/16/2018 1829   RBC 4.43 02/06/2021 0930   RBC 4.73 02/16/2018 1829   HGB 13.5 02/06/2021 0930   HCT 40.7 02/06/2021 0930   PLT 320 02/06/2021 0930   MCV 92 02/06/2021 0930   MCH 30.5 02/06/2021 0930   MCH 29.8 02/16/2018 1829   MCHC 33.2 02/06/2021 0930   MCHC 32.3 02/16/2018 1829   RDW 12.6 02/06/2021 0930   LYMPHSABS 1.3 02/06/2021 0930   MONOABS 0.3 02/16/2018 1829   EOSABS 0.1 02/06/2021 0930   BASOSABS 0.0 02/06/2021 0930    ASSESSMENT AND PLAN: 1. Essential hypertension Not at goal Continue hydrochlorothiazide 12.5 mg daily.  Increase amlodipine to 5 mg daily.  Continue to limit salt in the foods. - CBC - Comprehensive metabolic panel - amLODipine (NORVASC) 5 MG tablet; Take 1 tablet (5 mg total) by mouth daily.  Dispense: 90 tablet; Refill: 1  2. Hyperlipidemia, unspecified hyperlipidemia type Continue atorvastatin 20 mg daily.  Due  for lipid check. - Lipid panel  3. Morbid obesity ([place]) Encouraged her to try to eat healthy.  She struggles with curbing her appetite.  We will try to get the [facility] for her through our pharmacy.  It would likely have to be ordered.  Went over side effects of the Wegovy including nausea, vomiting, diarrhea.  Advised if she develops vomiting, or pain in the upper abdomen or unable to have bowel movement with abdominal pain, she should stop the medication as this can be indicative of pancreatitis or bowel blockage. Start Wegovy at 0.25 mg/week.  After 4 weeks, if she is tolerating the medicine, we can increase it to 0.5 mg/week. - Hemoglobin A1c  4. Carpal tunnel syndrome, bilateral Discussed diagnosis with patient.  Prescription given for her to get a pair of cock-up wrist splints from a  medical supply store.  If no improvement with the wrist splints, we can refer to neurology for EMG study - For home use only DME Other see comment  5. Tobacco dependence Advised to quit. Patient is ready to give a trial of quitting. Discussed methods to help her quit.  She is wanting to try the Chantix again.  Advised that the Chantix can cause mood swings and bad dreams.  She will let me know if this occurs.  We will start with the 1 month starter pack. Assess progress on subsequent visit. - Varenicline Tartrate, Starter, (CHANTIX STARTING MONTH PAK) 0.5 MG X 11 & 1 MG X 42 TBPK; Use as directed on box  Dispense: 53 each; Refill: 0  6. Need for immunization against influenza - Flu Vaccine QUAD 90moIM (Fluarix, Fluzone & Alfiuria Quad PF)   Addendum: I was informed by our clinical pharmacist that patient has to get the [name] on her insurance.  However [facility] not [facility] the starting dose of 0.25 or the 0.5 mg. I note that patient has requested refill on the for phentermine in the meantime while [name]  Refill will be sent.  Patient was given the opportunity to ask questions.  Patient verbalized understanding of the plan and was able to repeat key elements of the plan.   This documentation was completed using DRadio producer  Any transcriptional errors are unintentional.  No orders of the defined types were placed in this encounter.    Requested Prescriptions    No prescriptions requested or ordered in this encounter    No follow-ups on file.  DKarle Plumber MD, FACP

## 2021-12-29 NOTE — Patient Instructions (Addendum)
Start the [facility] at 0.25 mg once a week.  Once you have been on the medication for a month, if you are tolerating this dose then we can increase it to the 0.5 mg dose.  Once you get the [facility], please stop the phentermine.  I have sent a prescription to your pharmacy for the Chantix starter pack to help with smoking cessation.  Increase Norvasc to 5 mg daily

## 2021-12-30 ENCOUNTER — Other Ambulatory Visit: Payer: Self-pay | Admitting: Internal Medicine

## 2021-12-30 LAB — CBC
Hematocrit: 40.7 % (ref 34.0–46.6)
Hemoglobin: 13.2 g/dL (ref 11.1–15.9)
MCH: 30.3 pg (ref 26.6–33.0)
MCHC: 32.4 g/dL (ref 31.5–35.7)
MCV: 94 fL (ref 79–97)
Platelets: 292 10*3/uL (ref 150–450)
RBC: 4.35 x10E6/uL (ref 3.77–5.28)
RDW: 13 % (ref 11.7–15.4)
WBC: 5.4 10*3/uL (ref 3.4–10.8)

## 2021-12-30 LAB — LIPID PANEL
Chol/HDL Ratio: 2.8 ratio (ref 0.0–4.4)
Cholesterol, Total: 162 mg/dL (ref 100–199)
HDL: 58 mg/dL (ref >39)
LDL Chol Calc (NIH): 89 mg/dL (ref 0–99)
Triglycerides: 77 mg/dL (ref 0–149)
VLDL Cholesterol Cal: 15 mg/dL (ref 5–40)

## 2021-12-30 LAB — HEMOGLOBIN A1C
Est. average glucose Bld gHb Est-mCnc: 120 mg/dL
Hgb A1c MFr Bld: 5.8 % — ABNORMAL HIGH (ref 4.8–5.6)

## 2021-12-30 LAB — COMPREHENSIVE METABOLIC PANEL
ALT: 13 IU/L (ref 0–32)
AST: 16 IU/L (ref 0–40)
Albumin/Globulin Ratio: 1.2 (ref 1.2–2.2)
Albumin: 4.1 g/dL (ref 3.9–4.9)
Alkaline Phosphatase: 72 IU/L (ref 44–121)
BUN/Creatinine Ratio: 21 (ref 9–23)
BUN: 18 mg/dL (ref 6–24)
Bilirubin Total: 0.2 mg/dL (ref 0.0–1.2)
CO2: 26 mmol/L (ref 20–29)
Calcium: 9.4 mg/dL (ref 8.7–10.2)
Chloride: 103 mmol/L (ref 96–106)
Creatinine, Ser: 0.87 mg/dL (ref 0.57–1.00)
Globulin, Total: 3.5 g/dL (ref 1.5–4.5)
Glucose: 91 mg/dL (ref 70–99)
Potassium: 3.9 mmol/L (ref 3.5–5.2)
Sodium: 140 mmol/L (ref 134–144)
Total Protein: 7.6 g/dL (ref 6.0–8.5)
eGFR: 83 mL/min/{1.73_m2} (ref >59)

## 2022-01-13 ENCOUNTER — Other Ambulatory Visit: Payer: Self-pay | Admitting: Internal Medicine

## 2022-01-16 ENCOUNTER — Encounter: Payer: Self-pay | Admitting: Internal Medicine

## 2022-01-17 ENCOUNTER — Other Ambulatory Visit: Payer: Self-pay | Admitting: Internal Medicine

## 2022-01-17 DIAGNOSIS — G5603 Carpal tunnel syndrome, bilateral upper limbs: Secondary | ICD-10-CM

## 2022-01-20 ENCOUNTER — Ambulatory Visit: Payer: No Typology Code available for payment source | Admitting: Internal Medicine

## 2022-01-21 ENCOUNTER — Other Ambulatory Visit: Payer: Self-pay | Admitting: Internal Medicine

## 2022-01-21 DIAGNOSIS — E785 Hyperlipidemia, unspecified: Secondary | ICD-10-CM

## 2022-01-21 NOTE — Telephone Encounter (Signed)
Requested Prescriptions  Pending Prescriptions Disp Refills   atorvastatin (LIPITOR) 20 MG tablet [Pharmacy Med Name: ATORVASTATIN 20 MG TABLET] 90 tablet 3    Sig: TAKE 1 TABLET BY MOUTH EVERY DAY     Cardiovascular:  Antilipid - Statins Failed - 01/21/2022  1:36 AM      Failed - Lipid Panel in normal range within the last 12 months    Cholesterol, Total  Date Value Ref Range Status  12/29/2021 162 100 - 199 mg/dL Final   LDL Chol Calc (NIH)  Date Value Ref Range Status  12/29/2021 89 0 - 99 mg/dL Final   HDL  Date Value Ref Range Status  12/29/2021 58 >39 mg/dL Final   Triglycerides  Date Value Ref Range Status  12/29/2021 77 0 - 149 mg/dL Final         Passed - Patient is not pregnant      Passed - Valid encounter within last 12 months    Recent Outpatient Visits           3 weeks ago Essential hypertension   [place], MD   5 months ago Essential hypertension   [place], MD   10 months ago Vaginal discharge   [place], Deborah B, MD   1 year ago Vaginal discharge   [place], [place]   1 year ago Vaginal discharge   [place], MD       Future Appointments             In 1 month Wynetta Emery Dalbert Batman, MD [place]

## 2022-01-29 ENCOUNTER — Encounter: Payer: Self-pay | Admitting: Internal Medicine

## 2022-01-31 ENCOUNTER — Other Ambulatory Visit: Payer: Self-pay | Admitting: Internal Medicine

## 2022-01-31 MED ORDER — ZEPBOUND 2.5 MG/0.5ML ~~LOC~~ SOAJ: 2.5000 mg | SUBCUTANEOUS | 1 refills | Status: DC

## 2022-02-01 ENCOUNTER — Other Ambulatory Visit: Payer: Self-pay | Admitting: Internal Medicine

## 2022-02-01 ENCOUNTER — Encounter: Payer: Self-pay | Admitting: Internal Medicine

## 2022-02-01 DIAGNOSIS — G5603 Carpal tunnel syndrome, bilateral upper limbs: Secondary | ICD-10-CM

## 2022-02-03 ENCOUNTER — Ambulatory Visit: Payer: No Typology Code available for payment source | Admitting: Internal Medicine

## 2022-02-09 ENCOUNTER — Other Ambulatory Visit: Payer: Self-pay | Admitting: Internal Medicine

## 2022-02-10 NOTE — Telephone Encounter (Signed)
Requested medication (s) are due for refill today: yes  Requested medication (s) are on the active medication list: yes  Last refill:  11/02/21  Future visit scheduled: yes  Notes to clinic:  Unable to refill per protocol, cannot delegate.      Requested Prescriptions  Pending Prescriptions Disp Refills   phentermine 37.5 MG capsule [Pharmacy Med Name: PHENTERMINE 37.5 MG CAPSULE] 30 capsule 0    Sig: TAKE 1 CAPSULE (37.5 MG TOTAL) BY MOUTH EVERY MORNING. NEEDS APPT     Not Delegated - Neurology: Anticonvulsants - Controlled - phentermine hydrochloride Failed - 02/09/2022  1:05 PM      Failed - This refill cannot be delegated      Passed - eGFR in normal range and within 360 days    GFR, Est African American  Date Value Ref Range Status  11/02/2011 >89 mL/min Final   GFR calc Af Amer  Date Value Ref Range Status  02/17/2019 114 >59 mL/min/1.73 Final   GFR, Est Non African American  Date Value Ref Range Status  11/02/2011 >89 mL/min Final    Comment:      The estimated GFR is a calculation valid for adults (51 to 48 years old) that uses the CKD-EPI algorithm to adjust for age and sex. It is not to be used for children, pregnant women, hospitalized patients, patients on dialysis, or with rapidly changing kidney function. According to the NKDEP, eGFR >89 is normal, 60-89 shows mild impairment, 30-59 shows moderate impairment, 15-29 shows severe impairment and <15 is ESRD.   GFR calc non Af Amer  Date Value Ref Range Status  02/17/2019 99 >59 mL/min/1.73 Final   eGFR  Date Value Ref Range Status  12/29/2021 83 >59 mL/min/1.73 Final         Passed - Cr in normal range and within 360 days    Creat  Date Value Ref Range Status  11/02/2011 0.63 0.50 - 1.10 mg/dL Final   Creatinine, Ser  Date Value Ref Range Status  12/29/2021 0.87 0.57 - 1.00 mg/dL Final   Creatinine, Urine  Date Value Ref Range Status  01/03/2014 119.8 mg/dL Final    Comment:    No reference  range established. Performed at [facility] [facility] - Last BP in normal range    BP Readings from Last 1 Encounters:  12/29/21 138/82         Passed - Valid encounter within last 6 months    Recent Outpatient Visits           1 month ago Essential hypertension   [place], MD   6 months ago Essential hypertension   [place], [place]   11 months ago Vaginal discharge   [place], [place]   1 year ago Vaginal discharge   [place], [place]   1 year ago Vaginal discharge   [place], MD       Future Appointments             In 2 weeks Ladell Pier, MD [place] - Weight completed in the last 3 months  Wt Readings from Last 1 Encounters:  12/29/21 232 lb (105.2 kg)

## 2022-02-11 ENCOUNTER — Other Ambulatory Visit: Payer: Self-pay | Admitting: Internal Medicine

## 2022-02-16 ENCOUNTER — Encounter: Payer: Self-pay | Admitting: Physician Assistant

## 2022-02-16 ENCOUNTER — Ambulatory Visit (INDEPENDENT_AMBULATORY_CARE_PROVIDER_SITE_OTHER): Payer: No Typology Code available for payment source | Admitting: Physician Assistant

## 2022-02-16 DIAGNOSIS — R202 Paresthesia of skin: Secondary | ICD-10-CM

## 2022-02-16 NOTE — Progress Notes (Signed)
Office Visit Note   Patient: Diane [name]           Date of Birth: 1975/08/14           MRN: [id number] Visit Date: 02/16/2022              Requested by: Ladell Pier, MD [address],  [place] [address] PCP: Ladell Pier, MD   Assessment & Plan: Visit Diagnoses:  1. Paresthesias in left hand   2. Right hand paresthesia     Plan: Impression is bilateral hand paresthesias likely from underlying carpal tunnel syndrome.  At this point, we will go ahead and put in a referral for office for bilateral upper extremity nerve conduction studies.  Will have her follow-up with [place] once this is been completed.  Call with concerns or questions.  Follow-Up Instructions: Return for f/u after ncs.   Orders:  Orders Placed This Encounter  Procedures   Ambulatory referral to Physical Medicine Rehab   No orders of the defined types were placed in this encounter.     Procedures: No procedures performed   Clinical Data: No additional findings.   Subjective: Chief Complaint  Patient presents with   Left Wrist - Pain   Right Wrist - Pain    HPI patient is a pleasant 47 year old right-hand-dominant female who comes in today with bilateral hand paresthesias both equally as bad.  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  Symptoms appear to be worse when she is working as well as when she is driving and at night where she frequently wakes up shaking her hands.  She has tried nighttime bracing with some relief.  She feels that the paresthesias she gets are off to all 10 fingers.  No previous cortisone injection.  There is a nerve conduction study ordered in the computer but she is unsure when or where this is.  Review of Systems as detailed in HPI.  All others reviewed and are negative.   Objective: Vital Signs: There were no vitals taken for this visit.  Physical Exam well-developed well-nourished female no acute distress.  Alert and  oriented x 3.  Ortho Exam bilateral hand exam shows a positive Phalen and positive Tinel.  No thenar atrophy.  She is neurovascular intact distally.  Specialty Comments:  No specialty comments available.  Imaging: No new imaging   PMFS History: Patient Active Problem List   Diagnosis Date Noted   Tobacco dependence 10/31/2019   Knee pain, right 08/09/2019   Hyperlipidemia 03/24/2018   Essential hypertension 11/18/2017   Carpal tunnel syndrome of left wrist 11/18/2017   Anxiety 06/27/2012   Preventative health care 12/08/2011   Tobacco abuse 11/02/2011   Morbid obesity ([place]) 11/02/2011   Past Medical History:  Diagnosis Date   Anxiety    Back pain    Cellulitis    Constipation    Elevated cholesterol    Hypertension    Joint pain    Overweight     Family History  Problem Relation Age of Onset   Hyperlipidemia Mother    Cancer Mother        throat cancer   Hypertension Mother    Throat cancer Mother    Anxiety disorder Mother    Bipolar disorder Mother    Alcoholism Mother    Drug abuse Mother    Alcoholism Father    Hyperlipidemia Father    Hypertension Father  Heart disease Father 56   Stroke Father    Obesity Father    Diabetes Maternal Grandmother    Cancer Maternal Aunt        breast cancer   Cancer Maternal Aunt        breast cancer--in remision   Breast cancer Maternal Aunt    Pancreatic cancer Maternal Aunt    Cancer Maternal Uncle        prostate   Colon cancer Maternal Uncle    Cancer Maternal Uncle        throat cancer   Kidney disease Neg Hx     Past Surgical History:  Procedure Laterality Date   CESAREAN SECTION  02-04-2010;  11-30-2006;  07-12-2001   DILITATION & CURRETTAGE/HYSTROSCOPY WITH HYDROTHERMAL ABLATION N/A 10/01/2016   Procedure: DILATATION & CURETTAGE/HYSTEROSCOPY WITH HYDROTHERMAL ABLATION;  Surgeon: Dian Queen, MD;  Location: [facility];  Service: Gynecology;  Laterality: N/A;   LAPAROSCOPIC TUBAL LIGATION Bilateral  03/02/2014   Procedure: BILATERAL LAPAROSCOPIC TUBAL LIGATION;  Surgeon: Cyril Mourning, MD;  Location: [place];  Service: Gynecology;  Laterality: Bilateral;   TUBAL LIGATION  2014   WISDOM TOOTH EXTRACTION     Social History   Occupational History   Occupation: [employer]  Tobacco Use   Smoking status: Every Day    Packs/day: 0.50    Years: 15.00    Total pack years: 7.50    Types: Cigarettes   Smokeless tobacco: Current   Tobacco comments:    pt trying to quit down to 3 -4 cig's daily from 1ppd  Substance and Sexual Activity   Alcohol use: Yes    Comment:  occasionally   Drug use: No   Sexual activity: Yes    Birth control/protection: Surgical

## 2022-02-18 ENCOUNTER — Telehealth: Payer: Self-pay | Admitting: Emergency Medicine

## 2022-02-18 ENCOUNTER — Encounter: Payer: Self-pay | Admitting: Internal Medicine

## 2022-02-18 ENCOUNTER — Other Ambulatory Visit: Payer: Self-pay | Admitting: Internal Medicine

## 2022-02-18 DIAGNOSIS — R7303 Prediabetes: Secondary | ICD-10-CM | POA: Insufficient documentation

## 2022-02-18 MED ORDER — TIRZEPATIDE 2.5 MG/0.5ML ~~LOC~~ SOAJ: 2.5000 mg | SUBCUTANEOUS | 1 refills | Status: DC

## 2022-02-18 NOTE — Telephone Encounter (Signed)
Copied from [place] [id number]. Topic: General - Other >> Feb 18, 2022  3:11 PM Everette C wrote: Reason for CRM: Molinda Bailiff CCR with [employer] has called to request completion of a prior authorization for the patient's new prescription for tirzepatide [facility]) 2.5 MG/0.5ML Pen [[id number]]  CoverMyMeds Key [id number]   Please contact further when possible

## 2022-02-19 ENCOUNTER — Other Ambulatory Visit: Payer: Self-pay

## 2022-02-20 ENCOUNTER — Ambulatory Visit (INDEPENDENT_AMBULATORY_CARE_PROVIDER_SITE_OTHER): Payer: No Typology Code available for payment source | Admitting: Physical Medicine and Rehabilitation

## 2022-02-20 DIAGNOSIS — R531 Weakness: Secondary | ICD-10-CM

## 2022-02-20 DIAGNOSIS — M79601 Pain in right arm: Secondary | ICD-10-CM | POA: Diagnosis not present

## 2022-02-20 DIAGNOSIS — R202 Paresthesia of skin: Secondary | ICD-10-CM | POA: Diagnosis not present

## 2022-02-20 DIAGNOSIS — M542 Cervicalgia: Secondary | ICD-10-CM

## 2022-02-20 DIAGNOSIS — M79642 Pain in left hand: Secondary | ICD-10-CM

## 2022-02-20 DIAGNOSIS — M79641 Pain in right hand: Secondary | ICD-10-CM | POA: Diagnosis not present

## 2022-02-20 NOTE — Progress Notes (Unsigned)
Functional Pain Scale - descriptive words and definitions  No Pain (0)   No Pain/Loss of function  Average Pain 8-9  Right handed. Pain, numbness and weakness in both hands that radiate to wrists and can go up the arm. Pain and numbness is worse in the mornings and is dependant on activity

## 2022-02-23 NOTE — Procedures (Unsigned)
EMG & NCV Findings: Evaluation of the left median motor and the right median motor nerves showed prolonged distal onset latency (L5.3, R5.1 ms) and decreased conduction velocity (Elbow-Wrist, L48, R48 m/s).  The left median (across palm) sensory nerve showed no response (Palm) and prolonged distal peak latency (6.8 ms).  The right median (across palm) sensory nerve showed prolonged distal peak latency (Wrist, 6.0 ms), reduced amplitude (7.9 V), and prolonged distal peak latency (Palm, 2.6 ms).  All remaining nerves (as indicated in the following tables) were within normal limits.  All left vs. right side differences were within normal limits.    All examined muscles (as indicated in the following table) showed no evidence of electrical instability.    Impression: The above electrodiagnostic study is ABNORMAL and reveals evidence of a moderate to severe bilateral median nerve entrapment at the wrist (carpal tunnel syndrome) affecting sensory and motor components. There is no significant electrodiagnostic evidence of any other focal nerve entrapment, brachial plexopathy or cervical radiculopathy.   Recommendations: 1.  Follow-up with referring physician. 2.  Continue current management of symptoms. 3.  Suggest surgical evaluation.  ___________________________ Laurence Spates FAAPMR Board Certified, American Board of Physical Medicine and Rehabilitation    Nerve Conduction Studies Anti Sensory Summary Table   Stim Site NR Peak (ms) Norm Peak (ms) P-T Amp (V) Norm P-T Amp Site1 Site2 Delta-P (ms) Dist (cm) Vel (m/s) Norm Vel (m/s)  Right Med Ante Brach Cutan Anti Sensory (Med Forearm)  30.7C  Elbow    3.0  33.2  Elbow Med Forearm 3.0 0.0    Left Median Acr Palm Anti Sensory (2nd Digit)  30.5C  Wrist    *6.8 <3.6 19.4 >10 Wrist Palm  0.0    Palm *NR  <2.0          Right Median Acr Palm Anti Sensory (2nd Digit)  30C  Wrist    *6.0 <3.6 *7.9 >10 Wrist Palm 3.4 0.0    Palm    *2.6 <2.0 7.7          Right Radial Anti Sensory (Base 1st Digit)  30.8C  Wrist    2.2 <3.1 26.9  Wrist Base 1st Digit 2.2 0.0    Right Ulnar Anti Sensory (5th Digit)  30.9C  Wrist    3.1 <3.7 44.2 >15.0 Wrist 5th Digit 3.1 14.0 45 >38   Motor Summary Table   Stim Site NR Onset (ms) Norm Onset (ms) O-P Amp (mV) Norm O-P Amp Site1 Site2 Delta-0 (ms) Dist (cm) Vel (m/s) Norm Vel (m/s)  Left Median Motor (Abd Poll Brev)  30.7C  Wrist    *5.3 <4.2 9.6 >5 Elbow Wrist 4.1 19.5 *48 >50  Elbow    9.4  9.9         Right Median Motor (Abd Poll Brev)  31C  Wrist    *5.1 <4.2 7.5 >5 Elbow Wrist 4.1 19.5 *48 >50  Elbow    9.2  7.4         Right Ulnar Motor (Abd Dig Min)  31.2C  Wrist    2.7 <4.2 9.4 >3 B Elbow Wrist 3.0 19.0 63 >53  B Elbow    5.7  10.1  A Elbow B Elbow 1.1 10.0 91 >53  A Elbow    6.8  3.9          EMG   Side Muscle Nerve Root Ins Act Fibs Psw Amp Dur Poly Recrt Int Fraser Din Comment  Right Abd Poll Brev Median  C8-T1 Nml Nml Nml Nml Nml 0 Nml Nml   Right 1stDorInt Ulnar C8-T1 Nml Nml Nml Nml Nml 0 Nml Nml   Right PronatorTeres Median C6-7 Nml Nml Nml Nml Nml 0 Nml Nml   Right Biceps Musculocut C5-6 Nml Nml Nml Nml Nml 0 Nml Nml   Right Deltoid Axillary C5-6 Nml Nml Nml Nml Nml 0 Nml Nml     Nerve Conduction Studies Anti Sensory Left/Right Comparison   Stim Site L Lat (ms) R Lat (ms) L-R Lat (ms) L Amp (V) R Amp (V) L-R Amp (%) Site1 Site2 L Vel (m/s) R Vel (m/s) L-R Vel (m/s)  Med Ante Brach Cutan Anti Sensory (Med Forearm)  30.7C  Elbow  3.0   33.2  Elbow Med Forearm     Median Acr Palm Anti Sensory (2nd Digit)  30.5C  Wrist *6.8 *6.0 0.8 19.4 *7.9 59.3 Wrist Palm     Palm  *2.6   7.7        Radial Anti Sensory (Base 1st Digit)  30.8C  Wrist  2.2   26.9  Wrist Base 1st Digit     Ulnar Anti Sensory (5th Digit)  30.9C  Wrist  3.1   44.2  Wrist 5th Digit  45    Motor Left/Right Comparison   Stim Site L Lat (ms) R Lat (ms) L-R Lat (ms) L Amp (mV) R Amp (mV) L-R Amp (%) Site1 Site2 L  Vel (m/s) R Vel (m/s) L-R Vel (m/s)  Median Motor (Abd Poll Brev)  30.7C  Wrist *5.3 *5.1 0.2 9.6 7.5 21.9 Elbow Wrist *48 *48 0  Elbow 9.4 9.2 0.2 9.9 7.4 25.3       Ulnar Motor (Abd Dig Min)  31.2C  Wrist  2.7   9.4  B Elbow Wrist  63   B Elbow  5.7   10.1  A Elbow B Elbow  91   A Elbow  6.8   3.9           Waveforms:

## 2022-02-24 ENCOUNTER — Encounter: Payer: Self-pay | Admitting: Physical Medicine and Rehabilitation

## 2022-02-24 NOTE — Progress Notes (Signed)
Diane [name] - 47 y.o. female MRN [id number]  Date of birth: 1975-01-19  Office Visit Note: Visit Date: 02/20/2022 PCP: Ladell Pier, MD Referred by: Aundra Dubin, PA-C  Subjective: Chief Complaint  Patient presents with   Right Hand - Numbness, Pain, Weakness   Left Hand - Numbness, Pain, Weakness   HPI: Diane [name] is a 47 y.o. female who comes in today at the request of Tawanna Cooler, PA-C for evaluation and management of chronic, worsening and severe pain, numbness and tingling in the Bilateral upper extremities.  Patient is Right hand dominant.  She reports recent worsening over the last several months of chronic long-term bilateral hand pain with weakness and numbness and tingling globally in all 10 digits bilaterally.  She really cannot place any symptoms into the hands on more 1 side or the other.  She gets worsening complaints at night with a positive flick sign and also with driving and using the phone.  She also uses a computer quite a bit and gets a lot of symptoms with typing.  She does have some neck pain and not really pain down the arms but some pain radiating up into the right arm more than left arm.  She does not have a history of diabetes but does carry a diagnosis of prediabetes.  Her last hemoglobin A1c was just slightly elevated at 5.8.  She does not take any medications for diabetes or insulin.  No thyroid disease.  She has not had prior cervical surgery or hand surgery or trauma.  She has failed conservative care including medications and anti-inflammatories as well as hand bracing.     Review of Systems  Musculoskeletal:  Positive for joint pain and neck pain.  Neurological:  Positive for tingling and weakness.  All other systems reviewed and are negative.  Otherwise per HPI.  Assessment & Plan: Visit Diagnoses:    ICD-10-CM   1. Paresthesia of skin  R20.2 NCV with EMG (electromyography)    2. Bilateral hand pain  M79.641    M79.642      3. Weakness  R53.1     4. Right arm pain  M79.601     5. Cervicalgia  M54.2        Plan: Impression: Clinical picture is difficult with global symptoms into both hands with significant pain and paresthesia and subjective feeling of weakness.  Exam consistent with more median neuropathy although symptoms seem to be more than what you would expect from that although when the symptoms are flared up there can be exaggerated symptoms.  Nonetheless the patient has good strength in the hands although subjectively she says she feels weaker and is dropping objects.  She may have some osteoarthritic changes of the hands.  Electrodiagnostic study performed.  The above electrodiagnostic study is ABNORMAL and reveals evidence of a moderate to severe bilateral median nerve entrapment at the wrist (carpal tunnel syndrome) affecting sensory and motor components. There is no significant electrodiagnostic evidence of any other focal nerve entrapment, brachial plexopathy or cervical radiculopathy.   Recommendations: 1.  Follow-up with referring physician. 2.  Continue current management of symptoms. 3.  Suggest surgical evaluation.  Clinical correlation.  Meds & Orders: No orders of the defined types were placed in this encounter.   Orders Placed This Encounter  Procedures   NCV with EMG (electromyography)    Follow-up: Return for Tawanna Cooler, PA-C.   Procedures: No procedures performed  EMG & NCV Findings: Evaluation of the  left median motor and the right median motor nerves showed prolonged distal onset latency (L5.3, R5.1 ms) and decreased conduction velocity (Elbow-Wrist, L48, R48 m/s).  The left median (across palm) sensory nerve showed no response (Palm) and prolonged distal peak latency (6.8 ms).  The right median (across palm) sensory nerve showed prolonged distal peak latency (Wrist, 6.0 ms), reduced amplitude (7.9 V), and prolonged distal peak latency (Palm, 2.6 ms).  All remaining nerves  (as indicated in the following tables) were within normal limits.  All left vs. right side differences were within normal limits.    All examined muscles (as indicated in the following table) showed no evidence of electrical instability.    Impression: The above electrodiagnostic study is ABNORMAL and reveals evidence of a moderate to severe bilateral median nerve entrapment at the wrist (carpal tunnel syndrome) affecting sensory and motor components. There is no significant electrodiagnostic evidence of any other focal nerve entrapment, brachial plexopathy or cervical radiculopathy.   Recommendations: 1.  Follow-up with referring physician. 2.  Continue current management of symptoms. 3.  Suggest surgical evaluation.  ___________________________ Laurence Spates FAAPMR Board Certified, American Board of Physical Medicine and Rehabilitation    Nerve Conduction Studies Anti Sensory Summary Table   Stim Site NR Peak (ms) Norm Peak (ms) P-T Amp (V) Norm P-T Amp Site1 Site2 Delta-P (ms) Dist (cm) Vel (m/s) Norm Vel (m/s)  Right Med Ante Brach Cutan Anti Sensory (Med Forearm)  30.7C  Elbow    3.0  33.2  Elbow Med Forearm 3.0 0.0    Left Median Acr Palm Anti Sensory (2nd Digit)  30.5C  Wrist    *6.8 <3.6 19.4 >10 Wrist Palm  0.0    Palm *NR  <2.0          Right Median Acr Palm Anti Sensory (2nd Digit)  30C  Wrist    *6.0 <3.6 *7.9 >10 Wrist Palm 3.4 0.0    Palm    *2.6 <2.0 7.7         Right Radial Anti Sensory (Base 1st Digit)  30.8C  Wrist    2.2 <3.1 26.9  Wrist Base 1st Digit 2.2 0.0    Right Ulnar Anti Sensory (5th Digit)  30.9C  Wrist    3.1 <3.7 44.2 >15.0 Wrist 5th Digit 3.1 14.0 45 >38   Motor Summary Table   Stim Site NR Onset (ms) Norm Onset (ms) O-P Amp (mV) Norm O-P Amp Site1 Site2 Delta-0 (ms) Dist (cm) Vel (m/s) Norm Vel (m/s)  Left Median Motor (Abd Poll Brev)  30.7C  Wrist    *5.3 <4.2 9.6 >5 Elbow Wrist 4.1 19.5 *48 >50  Elbow    9.4  9.9         Right Median  Motor (Abd Poll Brev)  31C  Wrist    *5.1 <4.2 7.5 >5 Elbow Wrist 4.1 19.5 *48 >50  Elbow    9.2  7.4         Right Ulnar Motor (Abd Dig Min)  31.2C  Wrist    2.7 <4.2 9.4 >3 B Elbow Wrist 3.0 19.0 63 >53  B Elbow    5.7  10.1  A Elbow B Elbow 1.1 10.0 91 >53  A Elbow    6.8  3.9          EMG   Side Muscle Nerve Root Ins Act Fibs Psw Amp Dur Poly Recrt Int Fraser Din Comment  Right Abd Poll Brev Median C8-T1 Nml Nml Nml Nml Nml  0 Nml Nml   Right 1stDorInt Ulnar C8-T1 Nml Nml Nml Nml Nml 0 Nml Nml   Right PronatorTeres Median C6-7 Nml Nml Nml Nml Nml 0 Nml Nml   Right Biceps Musculocut C5-6 Nml Nml Nml Nml Nml 0 Nml Nml   Right Deltoid Axillary C5-6 Nml Nml Nml Nml Nml 0 Nml Nml     Nerve Conduction Studies Anti Sensory Left/Right Comparison   Stim Site L Lat (ms) R Lat (ms) L-R Lat (ms) L Amp (V) R Amp (V) L-R Amp (%) Site1 Site2 L Vel (m/s) R Vel (m/s) L-R Vel (m/s)  Med Ante Brach Cutan Anti Sensory (Med Forearm)  30.7C  Elbow  3.0   33.2  Elbow Med Forearm     Median Acr Palm Anti Sensory (2nd Digit)  30.5C  Wrist *6.8 *6.0 0.8 19.4 *7.9 59.3 Wrist Palm     Palm  *2.6   7.7        Radial Anti Sensory (Base 1st Digit)  30.8C  Wrist  2.2   26.9  Wrist Base 1st Digit     Ulnar Anti Sensory (5th Digit)  30.9C  Wrist  3.1   44.2  Wrist 5th Digit  45    Motor Left/Right Comparison   Stim Site L Lat (ms) R Lat (ms) L-R Lat (ms) L Amp (mV) R Amp (mV) L-R Amp (%) Site1 Site2 L Vel (m/s) R Vel (m/s) L-R Vel (m/s)  Median Motor (Abd Poll Brev)  30.7C  Wrist *5.3 *5.1 0.2 9.6 7.5 21.9 Elbow Wrist *48 *48 0  Elbow 9.4 9.2 0.2 9.9 7.4 25.3       Ulnar Motor (Abd Dig Min)  31.2C  Wrist  2.7   9.4  B Elbow Wrist  63   B Elbow  5.7   10.1  A Elbow B Elbow  91   A Elbow  6.8   3.9           Waveforms:                  Clinical History: No specialty comments available.   She reports that she has been smoking cigarettes. She has a 7.50 pack-year smoking history. She uses  smokeless tobacco.  Recent Labs    12/29/21 1426  HGBA1C 5.8*    Objective:  VS:  HT:    WT:   BMI:     BP:   HR: bpm  TEMP: ( )  RESP:  Physical Exam Vitals and nursing note reviewed.  Constitutional:      General: She is not in acute distress.    Appearance: Normal appearance. She is well-developed. She is obese. She is not ill-appearing.  HENT:     Head: Normocephalic and atraumatic.  Eyes:     Conjunctiva/sclera: Conjunctivae normal.     Pupils: Pupils are equal, round, and reactive to light.  Cardiovascular:     Rate and Rhythm: Normal rate.     Pulses: Normal pulses.  Pulmonary:     Effort: Pulmonary effort is normal.  Musculoskeletal:        General: Tenderness present. No swelling or deformity.     Cervical back: Normal range of motion. No rigidity.     Right lower leg: No edema.     Left lower leg: No edema.     Comments: Inspection reveals no atrophy of the bilateral APB or FDI or hand intrinsics. There is no swelling, color changes, allodynia or dystrophic changes. There is  5 out of 5 strength in the bilateral wrist extension, finger abduction and long finger flexion. There is intact sensation to light touch in all dermatomal and peripheral nerve distributions.  There seems to be some dysesthesia in both hands to touch.  There is a negative Froment's test bilaterally. There is a negative Tinel's test at the bilateral wrist and elbow. There is a positive Phalen's test bilaterally. There is a negative Hoffmann's test bilaterally.  Skin:    General: Skin is warm and dry.     Findings: No erythema or rash.  Neurological:     General: No focal deficit present.     Mental Status: She is alert and oriented to person, place, and time.     Cranial Nerves: No cranial nerve deficit.     Sensory: No sensory deficit.     Motor: No weakness or abnormal muscle tone.     Coordination: Coordination normal.     Gait: Gait normal.  Psychiatric:        Mood and Affect: Mood  normal.        Behavior: Behavior normal.     Ortho Exam  Imaging: No results found.  Past Medical/Family/Surgical/Social History: Medications & Allergies reviewed per EMR, new medications updated. Patient Active Problem List   Diagnosis Date Noted   Prediabetes 02/18/2022   Tobacco dependence 10/31/2019   Knee pain, right 08/09/2019   Hyperlipidemia 03/24/2018   Essential hypertension 11/18/2017   Carpal tunnel syndrome of left wrist 11/18/2017   Anxiety 06/27/2012   Preventative health care 12/08/2011   Tobacco abuse 11/02/2011   Morbid obesity ([place]) 11/02/2011   Past Medical History:  Diagnosis Date   Anxiety    Back pain    Cellulitis    Constipation    Elevated cholesterol    Hypertension    Joint pain    Overweight    Family History  Problem Relation Age of Onset   Hyperlipidemia Mother    Cancer Mother        throat cancer   Hypertension Mother    Throat cancer Mother    Anxiety disorder Mother    Bipolar disorder Mother    Alcoholism Mother    Drug abuse Mother    Alcoholism Father    Hyperlipidemia Father    Hypertension Father    Heart disease Father 67   Stroke Father    Obesity Father    Diabetes Maternal Grandmother    Cancer Maternal Aunt        breast cancer   Cancer Maternal Aunt        breast cancer--in remision   Breast cancer Maternal Aunt    Pancreatic cancer Maternal Aunt    Cancer Maternal Uncle        prostate   Colon cancer Maternal Uncle    Cancer Maternal Uncle        throat cancer   Kidney disease Neg Hx    Past Surgical History:  Procedure Laterality Date   CESAREAN SECTION  02-04-2010;  11-30-2006;  07-12-2001   DILITATION & CURRETTAGE/HYSTROSCOPY WITH HYDROTHERMAL ABLATION N/A 10/01/2016   Procedure: DILATATION & CURETTAGE/HYSTEROSCOPY WITH HYDROTHERMAL ABLATION;  Surgeon: Dian Queen, MD;  Location: [facility];  Service: Gynecology;  Laterality: N/A;   LAPAROSCOPIC TUBAL LIGATION Bilateral 03/02/2014   Procedure:  BILATERAL LAPAROSCOPIC TUBAL LIGATION;  Surgeon: Cyril Mourning, MD;  Location: [facility];  Service: Gynecology;  Laterality: Bilateral;   TUBAL LIGATION  2014   WISDOM TOOTH  EXTRACTION     Social History   Occupational History   Occupation: [employer]  Tobacco Use   Smoking status: Every Day    Packs/day: 0.50    Years: 15.00    Total pack years: 7.50    Types: Cigarettes   Smokeless tobacco: Current   Tobacco comments:    pt trying to quit down to 3 -4 cig's daily from 1ppd  Substance and Sexual Activity   Alcohol use: Yes    Comment:  occasionally   Drug use: No   Sexual activity: Yes    Birth control/protection: Surgical

## 2022-03-02 ENCOUNTER — Ambulatory Visit: Payer: No Typology Code available for payment source | Admitting: Internal Medicine

## 2022-03-03 ENCOUNTER — Encounter: Payer: Self-pay | Admitting: Orthopaedic Surgery

## 2022-03-03 ENCOUNTER — Ambulatory Visit (INDEPENDENT_AMBULATORY_CARE_PROVIDER_SITE_OTHER): Payer: No Typology Code available for payment source | Admitting: Orthopaedic Surgery

## 2022-03-03 DIAGNOSIS — G5601 Carpal tunnel syndrome, right upper limb: Secondary | ICD-10-CM | POA: Diagnosis not present

## 2022-03-03 DIAGNOSIS — G5602 Carpal tunnel syndrome, left upper limb: Secondary | ICD-10-CM

## 2022-03-03 NOTE — Progress Notes (Signed)
Office Visit Note   Patient: Diane [name]           Date of Birth: [birth year]           MRN: [id number] Visit Date: 03/03/2022              Requested by: Ladell Pier, MD [place] [place],  [place] [address] PCP: Ladell Pier, MD   Assessment & Plan: Visit Diagnoses:  1. Right carpal tunnel syndrome   2. Left carpal tunnel syndrome     Plan: EMGs show moderate to severe bilateral carpal tunnel syndrome.  Findings reviewed with the patient and treatment options were explained recommended surgical release.  Based on her options she has elected to move forward with a right carpal tunnel release in the near future.  Risk benefits prognosis reviewed.  Jackelyn Poling will call the patient to confirm surgery time.  Follow-Up Instructions: No follow-ups on file.   Orders:  No orders of the defined types were placed in this encounter.  No orders of the defined types were placed in this encounter.     Procedures: No procedures performed   Clinical Data: No additional findings.   Subjective: Chief Complaint  Patient presents with   Right Hand - Follow-up    EMG review   Left Hand - Follow-up    EMG review    HPI  Diane [name] returns today to go over recent EMGs for carpal tunnel syndrome.  Review of Systems   Objective: Vital Signs: There were no vitals taken for this visit.  Physical Exam  Ortho Exam  Examination of bilateral hands unchanged.  Specialty Comments:  No specialty comments available.  Imaging: No results found.   PMFS History: Patient Active Problem List   Diagnosis Date Noted   Prediabetes 02/18/2022   Tobacco dependence 10/31/2019   Knee pain, right 08/09/2019   Hyperlipidemia 03/24/2018   Essential hypertension 11/18/2017   Carpal tunnel syndrome of left wrist 11/18/2017   Anxiety 06/27/2012   Preventative health care 12/08/2011   Tobacco abuse 11/02/2011   Morbid obesity ([place]) 11/02/2011   Past Medical  History:  Diagnosis Date   Anxiety    Back pain    Cellulitis    Constipation    Elevated cholesterol    Hypertension    Joint pain    Overweight     Family History  Problem Relation Age of Onset   Hyperlipidemia Mother    Cancer Mother        throat cancer   Hypertension Mother    Throat cancer Mother    Anxiety disorder Mother    Bipolar disorder Mother    Alcoholism Mother    Drug abuse Mother    Alcoholism Father    Hyperlipidemia Father    Hypertension Father    Heart disease Father 9   Stroke Father    Obesity Father    Diabetes Maternal Grandmother    Cancer Maternal Aunt        breast cancer   Cancer Maternal Aunt        breast cancer--in remision   Breast cancer Maternal Aunt    Pancreatic cancer Maternal Aunt    Cancer Maternal Uncle        prostate   Colon cancer Maternal Uncle    Cancer Maternal Uncle        throat cancer   Kidney disease Neg Hx     Past Surgical History:  Procedure Laterality  Date   CESAREAN SECTION  02-04-2010;  11-30-2006;  07-12-2001   DILITATION & CURRETTAGE/HYSTROSCOPY WITH HYDROTHERMAL ABLATION N/A 10/01/2016   Procedure: DILATATION & CURETTAGE/HYSTEROSCOPY WITH HYDROTHERMAL ABLATION;  Surgeon: Dian Queen, MD;  Location: [facility];  Service: Gynecology;  Laterality: N/A;   LAPAROSCOPIC TUBAL LIGATION Bilateral 03/02/2014   Procedure: BILATERAL LAPAROSCOPIC TUBAL LIGATION;  Surgeon: Cyril Mourning, MD;  Location: [facility];  Service: Gynecology;  Laterality: Bilateral;   TUBAL LIGATION  2014   WISDOM TOOTH EXTRACTION     Social History   Occupational History   Occupation: [employer]  Tobacco Use   Smoking status: Every Day    Packs/day: 0.50    Years: 15.00    Total pack years: 7.50    Types: Cigarettes   Smokeless tobacco: Current   Tobacco comments:    pt trying to quit down to 3 -4 cig's daily from 1ppd  Substance and Sexual Activity   Alcohol use: Yes    Comment:  occasionally   Drug use: No    Sexual activity: Yes    Birth control/protection: Surgical

## 2022-03-04 ENCOUNTER — Encounter: Payer: Self-pay | Admitting: Internal Medicine

## 2022-03-04 ENCOUNTER — Other Ambulatory Visit: Payer: Self-pay | Admitting: Internal Medicine

## 2022-03-04 MED ORDER — ZEPBOUND 2.5 MG/0.5ML ~~LOC~~ SOAJ: 2.5000 mg | SUBCUTANEOUS | 0 refills | Status: DC

## 2022-03-05 ENCOUNTER — Telehealth: Payer: Self-pay | Admitting: Internal Medicine

## 2022-03-05 ENCOUNTER — Other Ambulatory Visit: Payer: Self-pay

## 2022-03-05 NOTE — Telephone Encounter (Signed)
-----   Message from Rickey Barbara, CPhT sent at 03/05/2022  7:40 AM EST ----- I initiated a prior authorization and it was not processed. Per insurance, 'prior Josem Kaufmann has already been resolved, no additional PA is required.' I will call the patient this afternoon to see what exactly she is needing. ----- Message ----- From: Ladell Pier, MD Sent: 03/04/2022   6:21 PM EST To: Tresa Endo, RPH-CPP; #  I received this message from pt: [employer] just sent a PR request to you so they can resubmit the zepbound because now my insurance is covering some of the fees just need the approval so I can get the second month dose. I did get the first no side effects working good. Please submit prior approval.

## 2022-03-19 ENCOUNTER — Ambulatory Visit: Payer: No Typology Code available for payment source | Attending: Internal Medicine | Admitting: Physician Assistant

## 2022-03-19 ENCOUNTER — Other Ambulatory Visit: Payer: Self-pay | Admitting: Physician Assistant

## 2022-03-19 ENCOUNTER — Encounter: Payer: Self-pay | Admitting: Physician Assistant

## 2022-03-19 DIAGNOSIS — R7303 Prediabetes: Secondary | ICD-10-CM | POA: Diagnosis not present

## 2022-03-19 MED ORDER — TIRZEPATIDE-WEIGHT MANAGEMENT 5 MG/0.5ML ~~LOC~~ SOAJ: 5.0000 mg | SUBCUTANEOUS | 1 refills | Status: DC

## 2022-03-19 NOTE — Telephone Encounter (Signed)
Requested medication (s) are due for refill today: see request  Requested medication (s) are on the active medication list: yes  Last refill:  2/21 24 #2 ml 0 refills  Future visit scheduled: no see today   Notes to clinic:  medication not assigned to a protocol. Pharmacy comment: Alternative Requested:DRUG NOT COVERED.      Requested Prescriptions  Pending Prescriptions Disp Refills   ZEPBOUND 5 MG/0.5ML Pen [Pharmacy Med Name: ZEPBOUND 5 MG/0.5 ML PEN]  1    Sig: INJECT 5 MG SUBCUTANEOUSLY WEEKLY     Off-Protocol Failed - 03/19/2022  3:40 PM      Failed - Medication not assigned to a protocol, review manually.      Passed - Valid encounter within last 12 months    Recent Outpatient Visits           Today Morbid obesity [facility])   Florala Newton, [place], [place]   2 months ago Essential hypertension   [place], [place]   7 months ago Essential hypertension   [place] Ladell Pier, [place]   1 year ago Vaginal discharge   [place], [place]   1 year ago Vaginal discharge   Ruston Walker, [place], [place]

## 2022-03-19 NOTE — Progress Notes (Signed)
Patient ID: Diane [name], female   DOB: 03/21/1975, 47 y.o.   MRN: [id number]   Diane [name], is a 47 y.o. female  [id number]  [id number]  DOB - 11-Feb-1975  No chief complaint on file.      Subjective:   Diane [name] is a 47 y.o. female here today for a follow up visit after PCP started her on zepbound for prediabetes and weight loss.  She has been taking the 2.'5mg'$  for 1 month and wants to do the upward titration.  She is tolerating it without side effects.  She has not weighed herself and did not weigh herself prior to starting it in Feb.  Last weight in epic was 232 in December.  She has not really noticed a difference in appetite yet.  She has not experienced any feelings of hypoglycemia  No problems updated.  ALLERGIES: Allergies  Allergen Reactions   Lisinopril Swelling    Lip swelling    PAST MEDICAL HISTORY: Past Medical History:  Diagnosis Date   Anxiety    Back pain    Cellulitis    Constipation    Elevated cholesterol    Hypertension    Joint pain    Overweight     MEDICATIONS AT HOME: Prior to Admission medications   Medication Sig Start Date End Date Taking? Authorizing Provider  amLODipine (NORVASC) 5 MG tablet Take 1 tablet (5 mg total) by mouth daily. 12/29/21  Yes Ladell Pier, MD  atorvastatin (LIPITOR) 20 MG tablet TAKE 1 TABLET BY MOUTH EVERY DAY 01/21/22  Yes Ladell Pier, MD  hydrochlorothiazide (HYDRODIURIL) 12.5 MG tablet TAKE 1 TABLET BY MOUTH EVERY DAY 09/17/21  Yes Ladell Pier, MD  phentermine 37.5 MG capsule TAKE 1 CAPSULE (37.5 MG TOTAL) BY MOUTH EVERY MORNING. NEEDS APPT 02/11/22  Yes Ladell Pier, MD  tirzepatide [facility]) 2.5 MG/0.5ML Pen Inject 2.5 mg into the skin once a week. 03/04/22  Yes Ladell Pier, MD  tirzepatide [facility]) 5 MG/0.5ML Pen Inject 5 mg into the skin once a week. 03/19/22  Yes Burnell Hurta, Levada Dy M, PA-C  ALPRAZolam Duanne Moron) 0.5 MG tablet Take 0.5 mg by mouth 3 (three) times  daily as needed. Patient not taking: Reported on 03/19/2022 08/07/19   [provider]  Potassium Chloride CR (MICRO-K) 8 MEQ CPCR capsule CR Take 1 capsule (8 mEq total) by mouth daily. Patient not taking: Reported on 03/19/2022 11/06/20   Argentina Donovan, PA-C  Varenicline Tartrate, Starter, (CHANTIX STARTING MONTH PAK) 0.5 MG X 11 & 1 MG X 42 TBPK Use as directed on box Patient not taking: Reported on 03/19/2022 12/29/21   Ladell Pier, MD  Vitamin D, Ergocalciferol, (DRISDOL) 1.25 MG (50000 UNIT) CAPS capsule Take 1 capsule (50,000 Units total) by mouth 2 (two) times a week. Patient not taking: Reported on 03/19/2022 02/20/21   Laqueta Linden, MD    ROS: Neg HEENT Neg resp Neg cardiac Neg GI Neg GU Neg MS Neg psych Neg neuro  Objective:   Vitals:   03/19/22 1505  BP: (!) 144/77  Pulse: 80  SpO2: 99%  Weight: 235 lb 9.6 oz (106.9 kg)   Exam General appearance : Awake, alert, not in any distress. Speech Clear. Not toxic looking HEENT: Atraumatic and Normocephalic Chest: Good air entry bilaterally, CTAB.  No rales/rhonchi/wheezing CVS: S1 S2 regular, no murmurs.  Neurology: Awake alert, and oriented X 3, CN II-XII intact, Non focal Skin: No Rash  Data Review Lab Results  Component Value Date   HGBA1C 5.8 (H) 12/29/2021   HGBA1C 5.8 (H) 02/06/2021   HGBA1C 5.6 11/06/2020    Assessment & Plan   1. Morbid obesity ([place]) No SE.  Will titrate dose from 2.5 to 5 and check kidney function.  Work on [employer] and exercise.   - tirzepatide (ZEPBOUND) 5 MG/0.5ML Pen; Inject 5 mg into the skin once a week.  Dispense: 2 mL; Refill: 1 - Basic metabolic panel    Return in about 6 weeks (around 04/30/2022) for PCP for med titration and weight loss check.  The patient was given clear instructions to go to ER or return to medical center if symptoms don't improve, worsen or new problems develop. The patient verbalized understanding. The patient was told to call to  get lab results if they haven't heard anything in the next week.      Freeman Caldron, PA-C [facility], [place]   03/19/2022, 3:17 PM

## 2022-03-19 NOTE — Patient Instructions (Signed)
Drink 80-100 ounces water daily

## 2022-03-20 LAB — BASIC METABOLIC PANEL
BUN/Creatinine Ratio: 18 (ref 9–23)
BUN: 12 mg/dL (ref 6–24)
CO2: 23 mmol/L (ref 20–29)
Calcium: 9.2 mg/dL (ref 8.7–10.2)
Chloride: 100 mmol/L (ref 96–106)
Creatinine, Ser: 0.67 mg/dL (ref 0.57–1.00)
Glucose: 82 mg/dL (ref 70–99)
Potassium: 3.4 mmol/L — ABNORMAL LOW (ref 3.5–5.2)
Sodium: 140 mmol/L (ref 134–144)
eGFR: 108 mL/min/{1.73_m2} (ref >59)

## 2022-03-25 ENCOUNTER — Other Ambulatory Visit: Payer: Self-pay | Admitting: Physician Assistant

## 2022-03-26 NOTE — Telephone Encounter (Signed)
Requested medication (s) are due for refill today - no  Requested medication (s) are on the active medication list -yes  Future visit scheduled -no  Last refill: 03/19/22  Notes to clinic: Pharmacy request: Alternative- Rx not covered  Requested Prescriptions  Pending Prescriptions Disp Refills   ZEPBOUND 5 MG/0.5ML Pen [Pharmacy Med Name: ZEPBOUND 5 MG/0.5 ML PEN]  1    Sig: INJECT 5 MG SUBCUTANEOUSLY WEEKLY     Off-Protocol Failed - 03/25/2022  4:53 PM      Failed - Medication not assigned to a protocol, review manually.      Passed - Valid encounter within last 12 months    Recent Outpatient Visits           1 week ago Morbid obesity [facility])   [place], [place], [place]   2 months ago Essential hypertension   [place], [place]   7 months ago Essential hypertension   [place] Ladell Pier, MD   1 year ago Vaginal discharge   [place], [place]   1 year ago Vaginal discharge   [place], [place]                 Requested Prescriptions  Pending Prescriptions Disp Refills   ZEPBOUND 5 MG/0.5ML Pen [Pharmacy Med Name: ZEPBOUND 5 MG/0.5 ML PEN]  1    Sig: INJECT 5 MG SUBCUTANEOUSLY WEEKLY     Off-Protocol Failed - 03/25/2022  4:53 PM      Failed - Medication not assigned to a protocol, review manually.      Passed - Valid encounter within last 12 months    Recent Outpatient Visits           1 week ago Morbid obesity [facility])   [place], [place], [place]   2 months ago Essential hypertension   [place], MD   7 months ago Essential hypertension   [place] Ladell Pier, MD   1 year ago  Vaginal discharge   [place], MD   1 year ago Vaginal discharge   [place], [place], [place]

## 2022-04-02 ENCOUNTER — Encounter: Payer: Self-pay | Admitting: Internal Medicine

## 2022-04-03 ENCOUNTER — Telehealth: Payer: Self-pay | Admitting: Internal Medicine

## 2022-04-03 MED ORDER — WEGOVY 0.5 MG/0.5ML ~~LOC~~ SOAJ: 0.5000 mg | SUBCUTANEOUS | 0 refills | Status: DC

## 2022-04-03 NOTE — Telephone Encounter (Signed)
Called [facility] [address] and spoke to [place], the pharmacist, and confirmed the cancellation of Zepbound refills.

## 2022-04-29 ENCOUNTER — Other Ambulatory Visit: Payer: Self-pay | Admitting: Internal Medicine

## 2022-05-14 ENCOUNTER — Other Ambulatory Visit: Payer: Self-pay | Admitting: Internal Medicine

## 2022-05-18 ENCOUNTER — Encounter: Payer: Self-pay | Admitting: Internal Medicine

## 2022-05-19 ENCOUNTER — Other Ambulatory Visit: Payer: Self-pay | Admitting: Internal Medicine

## 2022-05-20 NOTE — Telephone Encounter (Signed)
Requested medication (s) are due for refill today:No  Requested medication (s) are on the active medication list: NO    Last refill: 02/11/22  #30  0 refills  Future visit scheduled no}  Notes to clinic: Med discontinued 04/03/22. Cannot refuse non-delegated meds per protocol.  Requested Prescriptions  Pending Prescriptions Disp Refills   phentermine 37.5 MG capsule [Pharmacy Med Name: PHENTERMINE 37.5 MG CAPSULE] 30 capsule 0    Sig: TAKE 1 CAPSULE (37.5 MG TOTAL) BY MOUTH EVERY MORNING. NEEDS APPT     Not Delegated - Neurology: Anticonvulsants - Controlled - phentermine hydrochloride Failed - 05/19/2022  7:32 PM      Failed - This refill cannot be delegated      Failed - Last BP in normal range    BP Readings from Last 1 Encounters:  03/19/22 (!) 144/77         Passed - eGFR in normal range and within 360 days    GFR, Est African American  Date Value Ref Range Status  11/02/2011 >89 mL/min Final   GFR calc Af Amer  Date Value Ref Range Status  02/17/2019 114 >59 mL/min/1.73 Final   GFR, Est Non African American  Date Value Ref Range Status  11/02/2011 >89 mL/min Final    Comment:      The estimated GFR is a calculation valid for adults (62 to 48 years old) that uses the CKD-EPI algorithm to adjust for age and sex. It is not to be used for children, pregnant women, hospitalized patients, patients on dialysis, or with rapidly changing kidney function. According to the NKDEP, eGFR >89 is normal, 60-89 shows mild impairment, 30-59 shows moderate impairment, 15-29 shows severe impairment and <15 is ESRD.   GFR calc non Af Amer  Date Value Ref Range Status  02/17/2019 99 >59 mL/min/1.73 Final   eGFR  Date Value Ref Range Status  03/19/2022 108 >59 mL/min/1.73 Final         Passed - Cr in normal range and within 360 days    Creat  Date Value Ref Range Status  11/02/2011 0.63 0.50 - 1.10 mg/dL Final   Creatinine, Ser  Date Value Ref Range Status  03/19/2022 0.67  0.57 - 1.00 mg/dL Final   Creatinine, Urine  Date Value Ref Range Status  01/03/2014 119.8 mg/dL Final    Comment:    No reference range established. Performed at [employer] [facility] - Valid encounter within last 6 months    Recent Outpatient Visits           2 months ago Morbid obesity [facility])   [place] [facility] [place], [place], [place]   4 months ago Essential hypertension   [place] [facility] Marcine Matar, MD   9 months ago Essential hypertension   [facility] Marcine Matar, MD   1 year ago Vaginal discharge   [place] [facility] Marcine Matar, MD   1 year ago Vaginal discharge   [place] [facility] [place], Marylene Land [place], [place]              Passed - Weight completed in the last 3 months    Wt Readings from Last 1 Encounters:  03/19/22 235 lb 9.6 oz (106.9 kg)

## 2022-05-22 ENCOUNTER — Encounter: Payer: Self-pay | Admitting: Internal Medicine

## 2022-05-28 ENCOUNTER — Other Ambulatory Visit: Payer: Self-pay | Admitting: Internal Medicine

## 2022-05-28 DIAGNOSIS — I10 Essential (primary) hypertension: Secondary | ICD-10-CM

## 2022-06-18 ENCOUNTER — Other Ambulatory Visit: Payer: Self-pay

## 2022-06-18 NOTE — Progress Notes (Signed)
Patient attempted to be outreached by Cody Oliger, PharmD Candidate on 06/18/22 to discuss hypertension. Left voicemail for patient to return our call at their convenience at [phone].  Ziasia Lenoir, Student-PharmD  

## 2022-06-22 ENCOUNTER — Ambulatory Visit: Payer: No Typology Code available for payment source | Attending: Internal Medicine | Admitting: Internal Medicine

## 2022-06-22 ENCOUNTER — Encounter: Payer: Self-pay | Admitting: Internal Medicine

## 2022-06-22 DIAGNOSIS — F17210 Nicotine dependence, cigarettes, uncomplicated: Secondary | ICD-10-CM | POA: Diagnosis not present

## 2022-06-22 DIAGNOSIS — Z23 Encounter for immunization: Secondary | ICD-10-CM

## 2022-06-22 DIAGNOSIS — Z1231 Encounter for screening mammogram for malignant neoplasm of breast: Secondary | ICD-10-CM

## 2022-06-22 DIAGNOSIS — F17200 Nicotine dependence, unspecified, uncomplicated: Secondary | ICD-10-CM

## 2022-06-22 DIAGNOSIS — Z6841 Body Mass Index (BMI) 40.0 and over, adult: Secondary | ICD-10-CM

## 2022-06-22 DIAGNOSIS — I10 Essential (primary) hypertension: Secondary | ICD-10-CM

## 2022-06-22 DIAGNOSIS — R232 Flushing: Secondary | ICD-10-CM | POA: Diagnosis not present

## 2022-06-22 MED ORDER — WEGOVY 1 MG/0.5ML ~~LOC~~ SOAJ
1.0000 mg | SUBCUTANEOUS | 2 refills | Status: DC
Start: 2022-06-22 — End: 2022-12-31

## 2022-06-22 NOTE — Progress Notes (Signed)
Patient ID: Diane [name], female    DOB: 1975-12-02  MRN: [id number]  CC: Medication Refill (Med f/u - requesting higher dose of Wegovy. /Ht flashes becoming more frequent X6 mo/Tdap vax given - [place])   Subjective: Diane [name] is a 47 y.o. female who presents for f/u obesity Her concerns today include:  Patient with history of tob dep, morbid obesity, preDM, HTN, HL, CTS and anxiety   Obesity: Had previously requested to be placed on stepdown.  Her insurance paid for it just once then declined any further payments.  Changed to [facility].  On Wegovy 0.5 mg for past 2 mths.  Denies any significant side effects.  Down 5 lbs since last visit It has decreased her appetite.  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  HTN:  compliant with Norvasc 5 mg daily and HCTZ 12.5 mg daily.  She limits salt in the foods.  Hot flashes a lot; more frequent over the past several mths. Had uterine ablation 2018.   No full menses since then, spots occasional.  Last yr was the last time she had spotting.   Still smoking but dec to 1 pk/wk.  Not ready to quit. Patient Active Problem List   Diagnosis Date Noted   Prediabetes 02/18/2022   Tobacco dependence 10/31/2019   Knee pain, right 08/09/2019   Hyperlipidemia 03/24/2018   Essential hypertension 11/18/2017   Carpal tunnel syndrome of left wrist 11/18/2017   Anxiety 06/27/2012   Preventative health care 12/08/2011   Tobacco abuse 11/02/2011   Morbid obesity (HCC) 11/02/2011     Current Outpatient Medications on File Prior to Visit  Medication Sig Dispense Refill   amLODipine (NORVASC) 5 MG tablet TAKE 1 TABLET (5 MG TOTAL) BY MOUTH DAILY. 90 tablet 1   atorvastatin (LIPITOR) 20 MG tablet TAKE 1 TABLET BY MOUTH EVERY DAY 90 tablet 3   hydrochlorothiazide (HYDRODIURIL) 12.5 MG tablet TAKE 1 TABLET BY  MOUTH EVERY DAY 90 tablet 1   Semaglutide-Weight Management (WEGOVY) 0.5 MG/0.5ML SOAJ INJECT 0.5 MG INTO THE SKIN ONE TIME PER WEEK 2 mL 3   Varenicline Tartrate, Starter, (CHANTIX STARTING MONTH PAK) 0.5 MG X 11 & 1 MG X 42 TBPK Use as directed on box (Patient not taking: Reported on 03/19/2022) 53 each 0   Vitamin D, Ergocalciferol, (DRISDOL) 1.25 MG (50000 UNIT) CAPS capsule Take 1 capsule (50,000 Units total) by mouth 2 (two) times a week. (Patient not taking: Reported on 03/19/2022) 8 capsule 0   No current facility-administered medications on file prior to visit.    Allergies  Allergen Reactions   Lisinopril Swelling    Lip swelling    Social History   Socioeconomic History   Marital status: Single    Spouse name: Not on file   Number of children: 4   Years of education: Not on file   Highest education level: Not on file  Occupational History   Occupation: [employer]  Tobacco Use   Smoking status: Every Day    Packs/day: 0.50    Years: 15.00    Additional pack years: 0.00    Total pack years: 7.50    Types: Cigarettes   Smokeless tobacco: Current   Tobacco comments:    pt trying to quit down to 3 -4 cig's daily from 1ppd  Substance and Sexual  Activity   Alcohol use: Yes    Comment:  occasionally   Drug use: No   Sexual activity: Yes    Birth control/protection: Surgical  Other Topics Concern   Not on file  Social History Narrative   [employer]- works from home.     3 children- (103 year old adopted child) 10, 4 and 1.    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)   Single   Lives with sister and children   Social Determinants of Health   Financial Resource Strain: Not on file  Food Insecurity: Not on file  Transportation Needs: Not on file  Physical Activity: Not on file  Stress: Not on file  Social Connections: Not on file  Intimate Partner Violence: Not on file    Family History  Problem Relation Age of Onset   Hyperlipidemia Mother    Cancer Mother         throat cancer   Hypertension Mother    Throat cancer Mother    Anxiety disorder Mother    Bipolar disorder Mother    Alcoholism Mother    Drug abuse Mother    Alcoholism Father    Hyperlipidemia Father    Hypertension Father    Heart disease Father 66   Stroke Father    Obesity Father    Diabetes Maternal Grandmother    Cancer Maternal Aunt        breast cancer   Cancer Maternal Aunt        breast cancer--in remision   Breast cancer Maternal Aunt    Pancreatic cancer Maternal Aunt    Cancer Maternal Uncle        prostate   Colon cancer Maternal Uncle    Cancer Maternal Uncle        throat cancer   Kidney disease Neg Hx     Past Surgical History:  Procedure Laterality Date   CESAREAN SECTION  02-04-2010;  11-30-2006;  07-12-2001   DILITATION & CURRETTAGE/HYSTROSCOPY WITH HYDROTHERMAL ABLATION N/A 10/01/2016   Procedure: DILATATION & CURETTAGE/HYSTEROSCOPY WITH HYDROTHERMAL ABLATION;  Surgeon: Marcelle Overlie, MD;  Location: [facility];  Service: Gynecology;  Laterality: N/A;   LAPAROSCOPIC TUBAL LIGATION Bilateral 03/02/2014   Procedure: BILATERAL LAPAROSCOPIC TUBAL LIGATION;  Surgeon: Jeani Hawking, MD;  Location: [facility];  Service: Gynecology;  Laterality: Bilateral;   TUBAL LIGATION  2014   WISDOM TOOTH EXTRACTION      ROS: Review of Systems Negative except as stated above  PHYSICAL EXAM: BP 131/74 (BP Location: Left Arm, Patient Position: Sitting, Cuff Size: Large)   Pulse 80   Temp 98.4 F (36.9 C) (Oral)   Ht 4\' 11"  (1.499 m)   Wt 230 lb (104.3 kg)   SpO2 100%   BMI 46.45 kg/m   Wt Readings from Last 3 Encounters:  06/22/22 230 lb (104.3 kg)  03/19/22 235 lb 9.6 oz (106.9 kg)  12/29/21 232 lb (105.2 kg)    Physical Exam  General appearance - alert, well appearing, morbidly obese middle-age African-American female and in no distress Mental status - normal mood, behavior, speech, dress, motor activity, and thought  processes Chest - clear to auscultation, no wheezes, rales or rhonchi, symmetric air entry Heart - normal rate, regular rhythm, normal S1, S2, no murmurs, rubs, clicks or gallops Extremities - peripheral pulses normal, no pedal edema, no clubbing or cyanosis      Latest Ref Rng & Units 03/19/2022    3:20 PM 12/29/2021  2:26 PM 02/06/2021    9:30 AM  CMP  Glucose 70 - 99 mg/dL 82  91  75   BUN 6 - 24 mg/dL 12  18  14    Creatinine 0.57 - 1.00 mg/dL 1.61  0.96  0.45   Sodium 134 - 144 mmol/L 140  140  140   Potassium 3.5 - 5.2 mmol/L 3.4  3.9  4.0   Chloride 96 - 106 mmol/L 100  103  99   CO2 20 - 29 mmol/L 23  26  28    Calcium 8.7 - 10.2 mg/dL 9.2  9.4  9.3   Total Protein 6.0 - 8.5 g/dL  7.6  7.5   Total Bilirubin 0.0 - 1.2 mg/dL  0.2  0.3   Alkaline Phos 44 - 121 IU/L  72  77   AST 0 - 40 IU/L  16  18   ALT 0 - 32 IU/L  13  21    Lipid Panel     Component Value Date/Time   CHOL 162 12/29/2021 1426   TRIG 77 12/29/2021 1426   HDL 58 12/29/2021 1426   CHOLHDL 2.8 12/29/2021 1426   CHOLHDL 4.7 11/02/2011 1051   VLDL 16 11/02/2011 1051   LDLCALC 89 12/29/2021 1426    CBC    Component Value Date/Time   WBC 5.4 12/29/2021 1426   WBC 8.0 02/16/2018 1829   RBC 4.35 12/29/2021 1426   RBC 4.73 02/16/2018 1829   HGB 13.2 12/29/2021 1426   HCT 40.7 12/29/2021 1426   PLT 292 12/29/2021 1426   MCV 94 12/29/2021 1426   MCH 30.3 12/29/2021 1426   MCH 29.8 02/16/2018 1829   MCHC 32.4 12/29/2021 1426   MCHC 32.3 02/16/2018 1829   RDW 13.0 12/29/2021 1426   LYMPHSABS 1.3 02/06/2021 0930   MONOABS 0.3 02/16/2018 1829   EOSABS 0.1 02/06/2021 0930   BASOSABS 0.0 02/06/2021 0930    ASSESSMENT AND PLAN:  1. Morbid obesity (HCC) Discussed and encourage healthy eating habits. Continue to move is much as she can. She has not had significant weight loss since starting the [facility] 2 months ago.  Will increase the dose to 1 mg once a week.  Advised to watch out for any side effects.   Follow-up in 2 months. - Semaglutide-Weight Management (WEGOVY) 1 MG/0.5ML SOAJ; Inject 1 mg into the skin once a week.  Dispense: 2 mL; Refill: 2  2. Essential hypertension Close to goal.  Continue amlodipine 5 mg daily and HCTZ 12.5 mg daily. - Hepatic Function Panel  3. Hot flashes Discuss hot flashes in the context of menopause/perimenopause. Advised that hormone replacement therapy is best for treating hot flashes/vasomotor symptoms.  She is at increased risk for cardiovascular events due to tobacco dependence, hypertension and obesity.  If we do hormone replacement therapy, would probably be best to use estrogen patch in combination with oral progesterone.  I also discussed the new medication Veozah and the need for monitoring of LFTs.  Other options that are nonhormonal also SSRIs and gabapentin.  Patient states she will think about it and will let me know if she wants to try any of these medicines. - FSH/LH  4. Tobacco dependence Strongly advised to quit smoking  5. Encounter for screening mammogram for malignant neoplasm of breast - MM Digital Screening; Future    Patient was given the opportunity to ask questions.  Patient verbalized understanding of the plan and was able to repeat key elements of the plan.  This documentation was completed using Paediatric nurse.  Any transcriptional errors are unintentional.  Orders Placed This Encounter  Procedures   Tdap vaccine greater than or equal to 7yo IM     Requested Prescriptions    No prescriptions requested or ordered in this encounter    No follow-ups on file.  Jonah Blue, MD, FACP

## 2022-06-23 LAB — HEPATIC FUNCTION PANEL
ALT: 10 IU/L (ref 0–32)
AST: 16 IU/L (ref 0–40)
Albumin: 4.1 g/dL (ref 3.9–4.9)
Alkaline Phosphatase: 68 IU/L (ref 44–121)
Bilirubin Total: 0.2 mg/dL (ref 0.0–1.2)
Bilirubin, Direct: <0.1 mg/dL (ref 0.00–0.40)
Total Protein: 7.6 g/dL (ref 6.0–8.5)

## 2022-06-23 LAB — FSH/LH
FSH: 48.9 m[IU]/mL
LH: 48.8 m[IU]/mL

## 2022-07-01 ENCOUNTER — Encounter: Payer: Self-pay | Admitting: Internal Medicine

## 2022-07-07 ENCOUNTER — Encounter: Payer: Self-pay | Admitting: Internal Medicine

## 2022-07-07 ENCOUNTER — Other Ambulatory Visit: Payer: Self-pay | Admitting: Internal Medicine

## 2022-07-07 MED ORDER — PHENTERMINE HCL 37.5 MG PO CAPS: 37.5000 mg | ORAL_CAPSULE | ORAL | 0 refills | Status: DC

## 2022-07-14 ENCOUNTER — Other Ambulatory Visit: Payer: Self-pay

## 2022-07-15 ENCOUNTER — Other Ambulatory Visit: Payer: Self-pay

## 2022-07-23 ENCOUNTER — Ambulatory Visit: Payer: No Typology Code available for payment source | Attending: Physician Assistant | Admitting: Physician Assistant

## 2022-07-23 ENCOUNTER — Other Ambulatory Visit (HOSPITAL_COMMUNITY)
Admission: RE | Admit: 2022-07-23 | Discharge: 2022-07-23 | Disposition: A | Discharge status: Home / Self Care | Payer: No Typology Code available for payment source | Source: Ambulatory Visit | Attending: Physician Assistant | Admitting: Physician Assistant

## 2022-07-23 ENCOUNTER — Ambulatory Visit
Admission: RE | Admit: 2022-07-23 | Discharge: 2022-07-23 | Discharge status: Home / Self Care | Payer: No Typology Code available for payment source | Source: Ambulatory Visit | Attending: Internal Medicine | Admitting: Internal Medicine

## 2022-07-23 ENCOUNTER — Encounter: Payer: Self-pay | Admitting: Physician Assistant

## 2022-07-23 VITALS — BP 142/88 | HR 87 | Wt 234.0 lb

## 2022-07-23 DIAGNOSIS — Z113 Encounter for screening for infections with a predominantly sexual mode of transmission: Secondary | ICD-10-CM | POA: Insufficient documentation

## 2022-07-23 DIAGNOSIS — Z1231 Encounter for screening mammogram for malignant neoplasm of breast: Secondary | ICD-10-CM

## 2022-07-23 NOTE — Progress Notes (Signed)
Patient ID: Diane [name], female   DOB: [birth year], 47 y.o.   MRN: [id number]   Ashanna Heinsohn, is a 46 y.o. female  [id number]  [id number]  DOB - 06/16/1975  Chief Complaint  Patient presents with   std testing   Medication Refill    Vit d        Subjective:   Diane [name] is a 47 y.o. female here today for STD screening.  No s/sx.  She just has not done any testing in a while.  Monogamous with 1 partner for a long time.  Denies vaginal discharge, fevers, rash, urinary s/sx.     No problems updated.  ALLERGIES: Allergies  Allergen Reactions   Lisinopril Swelling    Lip swelling    PAST MEDICAL HISTORY: Past Medical History:  Diagnosis Date   Anxiety    Back pain    Cellulitis    Constipation    Elevated cholesterol    Hypertension    Joint pain    Overweight     MEDICATIONS AT HOME: Prior to Admission medications   Medication Sig Start Date End Date Taking? Authorizing Provider  phentermine 37.5 MG capsule Take 1 capsule (37.5 mg total) by mouth every morning. 07/07/22   Marcine Matar, MD  amLODipine (NORVASC) 5 MG tablet TAKE 1 TABLET (5 MG TOTAL) BY MOUTH DAILY. 05/28/22   Marcine Matar, MD  atorvastatin (LIPITOR) 20 MG tablet TAKE 1 TABLET BY MOUTH EVERY DAY 01/21/22   Marcine Matar, MD  hydrochlorothiazide (HYDRODIURIL) 12.5 MG tablet TAKE 1 TABLET BY MOUTH EVERY DAY 05/14/22   Marcine Matar, MD  Semaglutide-Weight Management [facility]) 1 MG/0.5ML SOAJ Inject 1 mg into the skin once a week. 06/22/22   Marcine Matar, MD  Varenicline Tartrate, Starter, (CHANTIX STARTING MONTH PAK) 0.5 MG X 11 & 1 MG X 42 TBPK Use as directed on box Patient not taking: Reported on 03/19/2022 12/29/21   Marcine Matar, MD  Vitamin D, Ergocalciferol, (DRISDOL) 1.25 MG (50000 UNIT) CAPS capsule Take 1 capsule (50,000 Units total) by mouth 2 (two) times a week. Patient not taking: Reported on 03/19/2022 02/20/21   Langston Reusing, MD     ROS: Neg HEENT Neg resp Neg cardiac Neg GI Neg GU Neg MS Neg psych Neg neuro  Objective:   Vitals:   07/23/22 1519  BP: (!) 142/88  Pulse: 87  SpO2: 100%  Weight: 234 lb (106.1 kg)   Exam General appearance : Awake, alert, not in any distress. Speech Clear. Not toxic looking HEENT: Atraumatic and Normocephalic Neck: Supple, no JVD. No cervical lymphadenopathy.  Chest: Good air entry bilaterally, CTAB.  No rales/rhonchi/wheezing CVS: S1 S2 regular, no murmurs.  Extremities: B/L Lower Ext shows no edema, both legs are warm to touch Neurology: Awake alert, and oriented X 3, CN II-XII intact, Non focal Skin: No Rash  Data Review Lab Results  Component Value Date   HGBA1C 5.8 (H) 12/29/2021   HGBA1C 5.8 (H) 02/06/2021   HGBA1C 5.6 11/06/2020    Assessment & Plan   1. Screen for STD (sexually transmitted disease) Safe sex practices - Cervicovaginal ancillary only - HIV Antibody (routine testing w rflx) - RPR    Return if symptoms worsen or fail to improve.  The patient was given clear instructions to go to ER or return to medical center if symptoms don't improve, worsen or new problems develop. The patient verbalized understanding. The patient was told to call to get  lab results if they haven't heard anything in the next week.      Georgian Co, PA-C [facility] [place], [place] [phone]   07/23/2022, 4:23 PM

## 2022-07-24 ENCOUNTER — Telehealth: Payer: Self-pay

## 2022-07-24 LAB — CERVICOVAGINAL ANCILLARY ONLY
Bacterial Vaginitis (gardnerella): NEGATIVE
Candida Glabrata: NEGATIVE
Candida Vaginitis: NEGATIVE
Chlamydia: NEGATIVE
Comment: NEGATIVE
Comment: NEGATIVE
Comment: NEGATIVE
Comment: NEGATIVE
Comment: NEGATIVE
Comment: NORMAL
Neisseria Gonorrhea: NEGATIVE
Trichomonas: NEGATIVE

## 2022-07-24 LAB — RPR: RPR Ser Ql: NONREACTIVE

## 2022-07-24 LAB — HIV ANTIBODY (ROUTINE TESTING W REFLEX): HIV Screen 4th Generation wRfx: NONREACTIVE

## 2022-07-24 NOTE — Telephone Encounter (Signed)
-----   Message from Georgian Co sent at 07/24/2022  1:04 PM EDT ----- HIV and Syphilis testing are negative.  Awaiting vaginal swab.  Thanks, Georgian Co, PA-C

## 2022-07-24 NOTE — Telephone Encounter (Signed)
Pt was called and is aware of results, DOB was confirmed.  ?

## 2022-07-27 ENCOUNTER — Telehealth: Payer: Self-pay

## 2022-07-27 NOTE — Telephone Encounter (Signed)
Pt was called and is aware of results, DOB was confirmed.  ?

## 2022-07-27 NOTE — Telephone Encounter (Signed)
-----   Message from Georgian Co sent at 07/27/2022 11:05 AM EDT ----- Your vaginal swabs were normal.  So, negative for trichomonas, chlamydia, gonorrhea, yeast and bacterial infection.  Thanks, Georgian Co, PA-C

## 2022-08-17 ENCOUNTER — Encounter: Payer: Self-pay | Admitting: Internal Medicine

## 2022-08-19 ENCOUNTER — Other Ambulatory Visit: Payer: Self-pay | Admitting: Internal Medicine

## 2022-08-20 ENCOUNTER — Telehealth: Payer: No Typology Code available for payment source | Admitting: Internal Medicine

## 2022-08-20 DIAGNOSIS — F320 Major depressive disorder, single episode, mild: Secondary | ICD-10-CM

## 2022-08-20 DIAGNOSIS — F419 Anxiety disorder, unspecified: Secondary | ICD-10-CM | POA: Diagnosis not present

## 2022-08-20 MED ORDER — HYDROXYZINE PAMOATE 25 MG PO CAPS: 25.0000 mg | ORAL_CAPSULE | Freq: Every day | ORAL | 2 refills | Status: DC

## 2022-08-20 NOTE — Progress Notes (Signed)
Patient ID: Diane [name], female   DOB: 1975/08/29, 47 y.o.   MRN: [id number] Virtual Visit via Video Note  I connected with Mariah Glassberg Hellenbrand on 08/20/2022 at 8:14 AM by a video enabled telemedicine application and verified that I am speaking with the correct person using two identifiers.  Location: Patient: home Provider: Office   I discussed the limitations of evaluation and management by telemedicine and the availability of in person appointments. The patient expressed understanding and agreed to proceed.  History of Present Illness: Patient with history of tob dep, morbid obesity, preDM, HTN, HL, CTS and anxiety  This was an urgent care visit.  Patient had sent a MyChart message 3 days ago reporting that she was crying all morning and feeling overwhelmed and stressed.    She is feeling stressed and having anxiety attacks because she is behind on her bills despite juggling 3-4 jobs including working from home for [employer], doing janitorial work, Pharmacist, community and some work with [employer].  She is stressed about her job with [employer].  Besides juggling her jobs, she has to take her son to football and her daughter to dance and stressed about getting them ready for school opening later this month.  Endorses problems concentrating and sleeping at nights.  Lately she has been feeling down.  Denies any suicidal or homicidal ideation.  Feels like she needs to take a mental break but cannot afford to do so right now.  She feels she would benefit from some counseling.  She is not too big about taking medication for anxiety/depression but do request something to help her calm down at nights.  In regards to her weight, she has not weighed herself lately.  She is requesting a refill on the phentermine again because she is still not been able to afford the [facility] anymore.  She has a follow-up appointment with me in October for chronic disease management.  Outpatient Encounter Medications as of 08/20/2022  Medication Sig    phentermine 37.5 MG capsule Take 1 capsule (37.5 mg total) by mouth every morning.   amLODipine (NORVASC) 5 MG tablet TAKE 1 TABLET (5 MG TOTAL) BY MOUTH DAILY.   atorvastatin (LIPITOR) 20 MG tablet TAKE 1 TABLET BY MOUTH EVERY DAY   hydrochlorothiazide (HYDRODIURIL) 12.5 MG tablet TAKE 1 TABLET BY MOUTH EVERY DAY   Semaglutide-Weight Management (WEGOVY) 1 MG/0.5ML SOAJ Inject 1 mg into the skin once a week.   Varenicline Tartrate, Starter, (CHANTIX STARTING MONTH PAK) 0.5 MG X 11 & 1 MG X 42 TBPK Use as directed on box (Patient not taking: Reported on 03/19/2022)   Vitamin D, Ergocalciferol, (DRISDOL) 1.25 MG (50000 UNIT) CAPS capsule Take 1 capsule (50,000 Units total) by mouth 2 (two) times a week. (Patient not taking: Reported on 03/19/2022)   No facility-administered encounter medications on file as of 08/20/2022.      Observations/Objective: Middle-age African-American female sitting in chair in NAD  Assessment and Plan: 1. Anxiety disorder, unspecified type 2. Major depressive disorder, single episode, mild (HCC) -Encouraged patient to consider how she can cut back.  It is stressful trying to work for jobs.  She will work out a plan on how to get caught up on her bills.  She feels she would benefit from counseling.  We will submit referral.  We discussed starting her on low-dose hydroxyzine to take at bedtime to help with sleep and decrease anxiety. - hydrOXYzine (VISTARIL) 25 MG capsule; Take 1 capsule (25 mg total) by mouth at  bedtime.  Dispense: 30 capsule; Refill: 2  3.  Morbid obesity Will send refill on phentermine.  Keep upcoming appointment in October.  Follow Up Instructions: As previously scheduled.   I discussed the assessment and treatment plan with the patient. The patient was provided an opportunity to ask questions and all were answered. The patient agreed with the plan and demonstrated an understanding of the instructions.   The patient was advised to call back or seek  an in-person evaluation if the symptoms worsen or if the condition fails to improve as anticipated.  I spent  15 minutes dedicated to the care of this patient on the date of this encounter to include previsit review of her MyChart message and my last office note, face-to-face time with patient discussing diagnosis and management and post visit placement of orders.  This note has been created with Education officer, environmental. Any transcriptional errors are unintentional.  Jonah Blue, MD

## 2022-08-21 ENCOUNTER — Telehealth: Payer: Self-pay | Admitting: Internal Medicine

## 2022-08-21 NOTE — Telephone Encounter (Signed)
-----   Message from Custer Blas sent at 08/21/2022 11:23 AM EDT ----- Regarding: RE: [facility] referral [facility]   Sent Referral to [facility]   [address] [place], [place] [address] Phone: [phone] FAX [phone] ----- Message ----- From: Marcine Matar, MD Sent: 08/20/2022   8:43 AM EDT To: Dionne Bucy Subject: BH referral                                    Patient needs referral to behavioral health for counseling.

## 2022-09-11 ENCOUNTER — Other Ambulatory Visit: Payer: Self-pay | Admitting: Internal Medicine

## 2022-09-11 DIAGNOSIS — F419 Anxiety disorder, unspecified: Secondary | ICD-10-CM

## 2022-09-27 ENCOUNTER — Other Ambulatory Visit: Payer: Self-pay | Admitting: Internal Medicine

## 2022-09-29 NOTE — Telephone Encounter (Signed)
Requested medication (s) are due for refill today: routing for approval  Requested medication (s) are on the active medication list: yes  Last refill:  08/20/22  Future visit scheduled: yes  Notes to clinic:  Unable to refill per protocol, cannot delegate.      Requested Prescriptions  Pending Prescriptions Disp Refills   phentermine 37.5 MG capsule [Pharmacy Med Name: PHENTERMINE 37.5 MG CAPSULE] 30 capsule 0    Sig: TAKE 1 CAPSULE BY MOUTH EVERY DAY IN THE MORNING     Not Delegated - Neurology: Anticonvulsants - Controlled - phentermine hydrochloride Failed - 09/27/2022  5:32 PM      Failed - This refill cannot be delegated      Failed - Last BP in normal range    BP Readings from Last 1 Encounters:  07/23/22 (!) 142/88         Passed - eGFR in normal range and within 360 days    GFR, Est African American  Date Value Ref Range Status  11/02/2011 >89 mL/min Final   GFR calc Af Amer  Date Value Ref Range Status  02/17/2019 114 >59 mL/min/1.73 Final   GFR, Est Non African American  Date Value Ref Range Status  11/02/2011 >89 mL/min Final    Comment:      The estimated GFR is a calculation valid for adults (28 to 48 years old) that uses the CKD-EPI algorithm to adjust for age and sex. It is not to be used for children, pregnant women, hospitalized patients, patients on dialysis, or with rapidly changing kidney function. According to the NKDEP, eGFR >89 is normal, 60-89 shows mild impairment, 30-59 shows moderate impairment, 15-29 shows severe impairment and <15 is ESRD.   GFR calc non Af Amer  Date Value Ref Range Status  02/17/2019 99 >59 mL/min/1.73 Final   eGFR  Date Value Ref Range Status  03/19/2022 108 >59 mL/min/1.73 Final         Passed - Cr in normal range and within 360 days    Creat  Date Value Ref Range Status  11/02/2011 0.63 0.50 - 1.10 mg/dL Final   Creatinine, Ser  Date Value Ref Range Status  03/19/2022 0.67 0.57 - 1.00 mg/dL Final    Creatinine, Urine  Date Value Ref Range Status  01/03/2014 119.8 mg/dL Final    Comment:    No reference range established. Performed at [facility] - Valid encounter within last 6 months    Recent Outpatient Visits           1 month ago Anxiety disorder, unspecified type   [facility] Marcine Matar, MD   2 months ago Screen for STD (sexually transmitted disease)   [place] [facility] [place], [place], [place]   3 months ago Morbid obesity [facility]   [place] [facility] Marcine Matar, MD   6 months ago Morbid obesity [facility])   [place] [facility] [place], [place], [place]   9 months ago Essential hypertension   [facility] Marcine Matar, MD       Future Appointments             In 3 weeks Marcine Matar, MD [facility]            Passed -  Weight completed in the last 3 months    Wt Readings from Last 1 Encounters:  07/23/22 234 lb (106.1 kg)

## 2022-10-22 ENCOUNTER — Ambulatory Visit: Payer: No Typology Code available for payment source | Admitting: Internal Medicine

## 2022-11-05 ENCOUNTER — Other Ambulatory Visit: Payer: Self-pay | Admitting: Internal Medicine

## 2022-12-13 ENCOUNTER — Other Ambulatory Visit: Payer: Self-pay | Admitting: Internal Medicine

## 2022-12-13 DIAGNOSIS — I10 Essential (primary) hypertension: Secondary | ICD-10-CM

## 2022-12-30 ENCOUNTER — Other Ambulatory Visit: Payer: Self-pay | Admitting: Internal Medicine

## 2022-12-31 ENCOUNTER — Encounter: Payer: Self-pay | Admitting: Internal Medicine

## 2022-12-31 ENCOUNTER — Ambulatory Visit: Payer: No Typology Code available for payment source | Attending: Internal Medicine | Admitting: Internal Medicine

## 2022-12-31 DIAGNOSIS — I10 Essential (primary) hypertension: Secondary | ICD-10-CM

## 2022-12-31 DIAGNOSIS — F17210 Nicotine dependence, cigarettes, uncomplicated: Secondary | ICD-10-CM

## 2022-12-31 DIAGNOSIS — Z6841 Body Mass Index (BMI) 40.0 and over, adult: Secondary | ICD-10-CM

## 2022-12-31 DIAGNOSIS — R7303 Prediabetes: Secondary | ICD-10-CM | POA: Diagnosis not present

## 2022-12-31 DIAGNOSIS — E785 Hyperlipidemia, unspecified: Secondary | ICD-10-CM

## 2022-12-31 DIAGNOSIS — E66813 Obesity, class 3: Secondary | ICD-10-CM

## 2022-12-31 DIAGNOSIS — Z23 Encounter for immunization: Secondary | ICD-10-CM

## 2022-12-31 DIAGNOSIS — F17200 Nicotine dependence, unspecified, uncomplicated: Secondary | ICD-10-CM

## 2022-12-31 DIAGNOSIS — Z5986 Financial insecurity: Secondary | ICD-10-CM

## 2022-12-31 MED ORDER — HYDROCHLOROTHIAZIDE 12.5 MG PO TABS
12.5000 mg | ORAL_TABLET | Freq: Every day | ORAL | 1 refills | Status: AC
Start: 2022-12-31 — End: ?

## 2022-12-31 MED ORDER — ATORVASTATIN CALCIUM 20 MG PO TABS
20.0000 mg | ORAL_TABLET | Freq: Every day | ORAL | 3 refills | Status: DC
Start: 2022-12-31 — End: 2023-07-15

## 2022-12-31 MED ORDER — AMLODIPINE BESYLATE 5 MG PO TABS
5.0000 mg | ORAL_TABLET | Freq: Every day | ORAL | 1 refills | Status: DC
Start: 2022-12-31 — End: 2023-07-15

## 2022-12-31 MED ORDER — PHENTERMINE HCL 37.5 MG PO CAPS
37.5000 mg | ORAL_CAPSULE | Freq: Every day | ORAL | 4 refills | Status: DC
Start: 2022-12-31 — End: 2023-04-14

## 2022-12-31 MED ORDER — TOPIRAMATE 25 MG PO TABS: 25.0000 mg | ORAL_TABLET | Freq: Every day | ORAL | 2 refills | Status: DC

## 2022-12-31 NOTE — Progress Notes (Signed)
Patient ID: Diane [name], female    DOB: [birth year]  MRN: [id number]  CC: Hypertension (HTN & prediabetes f/u. Denton Meek about over eating & weight gain - requesting hormone blood work/Difficulty obtaining Wegovy due to price/Flu vax administered on 12/31/2022 - C.A.)   Subjective: Diane [name] is a 47 y.o. female who presents for chronic ds management. Her concerns today include:  Patient with history of tob dep, morbid obesity, preDM, HTN, HL, CTS and anxiety   Discussed the use of AI scribe software for clinical note transcription with the patient, who gave verbal consent to proceed.  History of Present Illness   PreDM/Obesity:  presents with a significant weight gain of 16 pounds since June. She reports increased cravings for food, which she attributes to possible perimenopausal changes. She has been on Wegovy for weight loss, but due to high costs and insurance deductible, she was not able to afford it.  Once she has met her deductible, it becomes affordable again.  In the meantime we had switched her back to phentermine which she has been out of for 2 weeks. However, she reports that phentermine is not helping with her increased appetite and cravings. She also mentions that she has been less active recently since her involvement with her son's football team has ended.  She does have access to an elliptical at her house.    Tobacco dependence: The patient also reports that she continues to smoke cigarettes, but has not increased or decreased the amount she smokes.  Not ready to quit completely.  She is wanting to get her appetite under better control first.    HTN: Reports compliance with amlodipine 5 mg daily and HCTZ 12.5 mg daily.  She has been out of the HCTZ for about 2 weeks.  Plans to pick up refill today.  Limit salt in the foods. Reports compliance with taking the atorvastatin for cholesterol. Patient Active Problem List   Diagnosis Date Noted   Prediabetes 02/18/2022    Tobacco dependence 10/31/2019   Knee pain, right 08/09/2019   Hyperlipidemia 03/24/2018   Essential hypertension 11/18/2017   Carpal tunnel syndrome of left wrist 11/18/2017   Anxiety 06/27/2012   Preventative health care 12/08/2011   Tobacco abuse 11/02/2011   Morbid obesity (HCC) 11/02/2011     Current Outpatient Medications on File Prior to Visit  Medication Sig Dispense Refill   hydrOXYzine (VISTARIL) 25 MG capsule TAKE 1 CAPSULE BY MOUTH AT BEDTIME. 90 capsule 1   Varenicline Tartrate, Starter, (CHANTIX STARTING MONTH PAK) 0.5 MG X 11 & 1 MG X 42 TBPK Use as directed on box 53 each 0   Vitamin D, Ergocalciferol, (DRISDOL) 1.25 MG (50000 UNIT) CAPS capsule Take 1 capsule (50,000 Units total) by mouth 2 (two) times a week. 8 capsule 0   No current facility-administered medications on file prior to visit.    Allergies  Allergen Reactions   Lisinopril Swelling    Lip swelling    Social History   Socioeconomic History   Marital status: Single    Spouse name: Not on file   Number of children: 4   Years of education: Not on file   Highest education level: Not on file  Occupational History   Occupation: [employer]  Tobacco Use   Smoking status: Every Day    Current packs/day: 0.50    Average packs/day: 0.5 packs/day for 15.0 years (7.5 ttl pk-yrs)    Types: Cigarettes   Smokeless tobacco: Current   Tobacco comments:  pt trying to quit down to 3 -4 cig's daily from 1ppd  Substance and Sexual Activity   Alcohol use: Yes    Comment:  occasionally   Drug use: No   Sexual activity: Yes    Birth control/protection: Surgical  Other Topics Concern   Not on file  Social History Narrative   [employer]- works from home.     3 children- (4 year old adopted child) 10, 4 and 1.    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)   Single   Lives with sister and children   Social Drivers of Health   Financial Resource Strain: Medium Risk (12/31/2022)   Overall Financial  Resource Strain (CARDIA)    Difficulty of Paying Living Expenses: Somewhat hard  Food Insecurity: Food Insecurity Present (12/31/2022)   Hunger Vital Sign    Worried About Running Out of Food in the Last Year: Often true    Ran Out of Food in the Last Year: Often true  Transportation Needs: No Transportation Needs (12/31/2022)   PRAPARE - Administrator, Civil Service (Medical): No    Lack of Transportation (Non-Medical): No  Physical Activity: Inactive (12/31/2022)   Exercise Vital Sign    Days of Exercise per Week: 0 days    Minutes of Exercise per Session: 0 min  Stress: No Stress Concern Present (12/31/2022)   [employer] of Occupational Health - Occupational Stress Questionnaire    Feeling of Stress : Not at all  Social Connections: Moderately Integrated (12/31/2022)   Social Connection and Isolation Panel [NHANES]    Frequency of Communication with Friends and Family: More than three times a week    Frequency of Social Gatherings with Friends and Family: More than three times a week    Attends Religious Services: More than 4 times per year    Active Member of Clubs or Organizations: Yes    Attends Banker Meetings: More than 4 times per year    Marital Status: Never married  Intimate Partner Violence: Not At Risk (12/31/2022)   Humiliation, Afraid, Rape, and Kick questionnaire    Fear of Current or Ex-Partner: No    Emotionally Abused: No    Physically Abused: No    Sexually Abused: No    Family History  Problem Relation Age of Onset   Hyperlipidemia Mother    Cancer Mother        throat cancer   Hypertension Mother    Throat cancer Mother    Anxiety disorder Mother    Bipolar disorder Mother    Alcoholism Mother    Drug abuse Mother    Alcoholism Father    Hyperlipidemia Father    Hypertension Father    Heart disease Father 47   Stroke Father    Obesity Father    Diabetes Maternal Grandmother    Cancer Maternal Aunt         breast cancer   Cancer Maternal Aunt        breast cancer--in remision   Breast cancer Maternal Aunt    Pancreatic cancer Maternal Aunt    Cancer Maternal Uncle        prostate   Colon cancer Maternal Uncle    Cancer Maternal Uncle        throat cancer   Kidney disease Neg Hx     Past Surgical History:  Procedure Laterality Date   CESAREAN SECTION  02-04-2010;  11-30-2006;  07-12-2001   DILITATION & CURRETTAGE/HYSTROSCOPY WITH  HYDROTHERMAL ABLATION N/A 10/01/2016   Procedure: DILATATION & CURETTAGE/HYSTEROSCOPY WITH HYDROTHERMAL ABLATION;  Surgeon: Marcelle Overlie, MD;  Location: [facility];  Service: Gynecology;  Laterality: N/A;   LAPAROSCOPIC TUBAL LIGATION Bilateral 03/02/2014   Procedure: BILATERAL LAPAROSCOPIC TUBAL LIGATION;  Surgeon: Jeani Hawking, MD;  Location: [facility];  Service: Gynecology;  Laterality: Bilateral;   TUBAL LIGATION  2014   WISDOM TOOTH EXTRACTION      ROS: Review of Systems Negative except as stated above  PHYSICAL EXAM: BP 138/81 (Cuff Size: Large)   Pulse 69   Temp 98.1 F (36.7 C) (Oral)   Ht 4\' 11"  (1.499 m)   Wt 246 lb (111.6 kg)   SpO2 100%   BMI 49.69 kg/m   Wt Readings from Last 3 Encounters:  12/31/22 246 lb (111.6 kg)  07/23/22 234 lb (106.1 kg)  06/22/22 230 lb (104.3 kg)    Physical Exam  General appearance - alert, well appearing, morbidly obese middle-age African-American female and in no distress Mental status - normal mood, behavior, speech, dress, motor activity, and thought processes Neck - supple, no significant adenopathy Chest - clear to auscultation, no wheezes, rales or rhonchi, symmetric air entry Heart - normal rate, regular rhythm, normal S1, S2, no murmurs, rubs, clicks or gallops Extremities - peripheral pulses normal, no pedal edema, no clubbing or cyanosis      Latest Ref Rng & Units 06/22/2022    4:02 PM 03/19/2022    3:20 PM 12/29/2021    2:26 PM  CMP  Glucose 70 - 99 mg/dL  82  91    BUN 6 - 24 mg/dL  12  18   Creatinine 1.61 - 1.00 mg/dL  0.96  0.45   Sodium 409 - 144 mmol/L  140  140   Potassium 3.5 - 5.2 mmol/L  3.4  3.9   Chloride 96 - 106 mmol/L  100  103   CO2 20 - 29 mmol/L  23  26   Calcium 8.7 - 10.2 mg/dL  9.2  9.4   Total Protein 6.0 - 8.5 g/dL 7.6   7.6   Total Bilirubin 0.0 - 1.2 mg/dL 0.2   0.2   Alkaline Phos 44 - 121 IU/L 68   72   AST 0 - 40 IU/L 16   16   ALT 0 - 32 IU/L 10   13    Lipid Panel     Component Value Date/Time   CHOL 162 12/29/2021 1426   TRIG 77 12/29/2021 1426   HDL 58 12/29/2021 1426   CHOLHDL 2.8 12/29/2021 1426   CHOLHDL 4.7 11/02/2011 1051   VLDL 16 11/02/2011 1051   LDLCALC 89 12/29/2021 1426    CBC    Component Value Date/Time   WBC 5.4 12/29/2021 1426   WBC 8.0 02/16/2018 1829   RBC 4.35 12/29/2021 1426   RBC 4.73 02/16/2018 1829   HGB 13.2 12/29/2021 1426   HCT 40.7 12/29/2021 1426   PLT 292 12/29/2021 1426   MCV 94 12/29/2021 1426   MCH 30.3 12/29/2021 1426   MCH 29.8 02/16/2018 1829   MCHC 32.4 12/29/2021 1426   MCHC 32.3 02/16/2018 1829   RDW 13.0 12/29/2021 1426   LYMPHSABS 1.3 02/06/2021 0930   MONOABS 0.3 02/16/2018 1829   EOSABS 0.1 02/06/2021 0930   BASOSABS 0.0 02/06/2021 0930    ASSESSMENT AND PLAN: 1. Morbid obesity (HCC) (Primary) 2. Prediabetes Patient had 16 pounds weight gain since June of this year.  She reports increased appetite and food cravings.  Not able to afford Wegovy anymore due to high deductible.  Even though she feels the phentermine does not help, she wants to resume taking it.  I recommend we add low-dose of Topamax to it to see if this would bring about a greater effect for her.  No history of glaucoma or kidney stones.  Will have her take the Topamax at bedtime. -Resume Phentermine 37.5mg  daily. -Add Topamax at bedtime to aid in weight loss. -Encourage regular use of home elliptical machine, starting with two days a week for ten minutes and gradually increasing. -  phentermine 37.5 MG capsule; Take 1 capsule (37.5 mg total) by mouth daily.  Dispense: 30 capsule; Refill: 4 - CBC - Comprehensive metabolic panel - topiramate (TOPAMAX) 25 MG tablet; Take 1 tablet (25 mg total) by mouth at bedtime.  Dispense: 30 tablet; Refill: 2 - Hemoglobin A1c   3. Essential hypertension Repeat blood pressure not at goal but better.  She has been out of HCTZ x 2 weeks.  Refill given.  Continue amlodipine. - hydrochlorothiazide (HYDRODIURIL) 12.5 MG tablet; Take 1 tablet (12.5 mg total) by mouth daily.  Dispense: 90 tablet; Refill: 1 - amLODipine (NORVASC) 5 MG tablet; Take 1 tablet (5 mg total) by mouth daily.  Dispense: 90 tablet; Refill: 1  4. Hyperlipidemia, unspecified hyperlipidemia type - Lipid panel - atorvastatin (LIPITOR) 20 MG tablet; Take 1 tablet (20 mg total) by mouth daily.  Dispense: 90 tablet; Refill: 3  5. Tobacco dependence Strongly encouraged her to quit.  She is not ready to give a trial of quitting.  6. Encounter for immunization - Flu vaccine trivalent PF, 6mos and older(Flulaval,Afluria,Fluarix,Fluzone)  Assessment and Plan      Patient was given the opportunity to ask questions.  Patient verbalized understanding of the plan and was able to repeat key elements of the plan.   This documentation was completed using Paediatric nurse.  Any transcriptional errors are unintentional.  Orders Placed This Encounter  Procedures   Flu vaccine trivalent PF, 6mos and older(Flulaval,Afluria,Fluarix,Fluzone)   CBC   Comprehensive metabolic panel   Lipid panel   Hemoglobin A1c     Requested Prescriptions   Signed Prescriptions Disp Refills   phentermine 37.5 MG capsule 30 capsule 4    Sig: Take 1 capsule (37.5 mg total) by mouth daily.   topiramate (TOPAMAX) 25 MG tablet 30 tablet 2    Sig: Take 1 tablet (25 mg total) by mouth at bedtime.   hydrochlorothiazide (HYDRODIURIL) 12.5 MG tablet 90 tablet 1    Sig: Take 1 tablet  (12.5 mg total) by mouth daily.   amLODipine (NORVASC) 5 MG tablet 90 tablet 1    Sig: Take 1 tablet (5 mg total) by mouth daily.   atorvastatin (LIPITOR) 20 MG tablet 90 tablet 3    Sig: Take 1 tablet (20 mg total) by mouth daily.    Return in about 2 months (around 03/03/2023) for Video visit for f/u on obesity.  Jonah Blue, MD, FACP

## 2022-12-31 NOTE — Patient Instructions (Signed)
Start Topamax 25 mg at bedtime.  We are using this with the Phentermine to help with weight loss.

## 2023-01-01 LAB — HEMOGLOBIN A1C
Est. average glucose Bld gHb Est-mCnc: 111 mg/dL
Hgb A1c MFr Bld: 5.5 % (ref 4.8–5.6)

## 2023-01-01 LAB — COMPREHENSIVE METABOLIC PANEL
ALT: 15 [IU]/L (ref 0–32)
AST: 18 [IU]/L (ref 0–40)
Albumin: 4.2 g/dL (ref 3.9–4.9)
Alkaline Phosphatase: 84 [IU]/L (ref 44–121)
BUN/Creatinine Ratio: 19 (ref 9–23)
BUN: 12 mg/dL (ref 6–24)
Bilirubin Total: 0.2 mg/dL (ref 0.0–1.2)
CO2: 23 mmol/L (ref 20–29)
Calcium: 9.5 mg/dL (ref 8.7–10.2)
Chloride: 101 mmol/L (ref 96–106)
Creatinine, Ser: 0.64 mg/dL (ref 0.57–1.00)
Globulin, Total: 3.7 g/dL (ref 1.5–4.5)
Glucose: 97 mg/dL (ref 70–99)
Potassium: 4.3 mmol/L (ref 3.5–5.2)
Sodium: 140 mmol/L (ref 134–144)
Total Protein: 7.9 g/dL (ref 6.0–8.5)
eGFR: 109 mL/min/{1.73_m2} (ref >59)

## 2023-01-01 LAB — CBC
Hematocrit: 42.6 % (ref 34.0–46.6)
Hemoglobin: 13.5 g/dL (ref 11.1–15.9)
MCH: 29.9 pg (ref 26.6–33.0)
MCHC: 31.7 g/dL (ref 31.5–35.7)
MCV: 94 fL (ref 79–97)
Platelets: 324 10*3/uL (ref 150–450)
RBC: 4.52 x10E6/uL (ref 3.77–5.28)
RDW: 13 % (ref 11.7–15.4)
WBC: 4.8 10*3/uL (ref 3.4–10.8)

## 2023-01-01 LAB — LIPID PANEL
Chol/HDL Ratio: 2.6 {ratio} (ref 0.0–4.4)
Cholesterol, Total: 168 mg/dL (ref 100–199)
HDL: 65 mg/dL (ref >39)
LDL Chol Calc (NIH): 92 mg/dL (ref 0–99)
Triglycerides: 55 mg/dL (ref 0–149)
VLDL Cholesterol Cal: 11 mg/dL (ref 5–40)

## 2023-03-05 ENCOUNTER — Telehealth (HOSPITAL_BASED_OUTPATIENT_CLINIC_OR_DEPARTMENT_OTHER): Payer: Self-pay | Admitting: Internal Medicine

## 2023-03-05 ENCOUNTER — Encounter: Payer: Self-pay | Admitting: Internal Medicine

## 2023-03-05 NOTE — Progress Notes (Signed)
Patient ID: Diane [name], female   DOB: 28-Sep-1975, 48 y.o.   MRN: [id number] Virtual Visit via Video Note  I connected with Diane [name] on 03/05/2023 at 2:40 PM by a video enabled telemedicine application and verified that I am speaking with the correct person using two identifiers.  Location: Patient: home Provider: Office   I discussed the limitations of evaluation and management by telemedicine and the availability of in person appointments. The patient expressed understanding and agreed to proceed.  History of Present Illness: Patient with history of tob dep, morbid obesity, preDM, HTN, HL, CTS and anxiety   Discussed the use of AI scribe software for clinical note transcription with the patient, who gave verbal consent to proceed.  History of Present Illness   The patient, with a history of obesity and hypertension, presents for a follow-up visit to assess her response to phentermine and Topamax for weight management. She reports a slight improvement in weight loss since adding Topamax to her regimen, but not as significant as when she was receiving Wegovy injections. She also notes a decrease in appetite. She has been tolerating the Topamax well, with no new onset of drowsiness or numbness/tingling in the extremities.  The patient recently lost her job and has been busy trying to make ends meet, which has affected her ability to focus on her weight loss efforts. 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 However, she has not been engaging in any additional physical activity outside of work due to fatigue. She plans to join a line dance class for additional exercise.  On last visit 2 mths ago she was 246 lbs. She has not weigh herself recently.        Outpatient Encounter Medications as of 03/05/2023  Medication Sig   amLODipine (NORVASC) 5 MG tablet Take 1 tablet (5 mg total) by mouth daily.   atorvastatin  (LIPITOR) 20 MG tablet Take 1 tablet (20 mg total) by mouth daily.   hydrochlorothiazide (HYDRODIURIL) 12.5 MG tablet Take 1 tablet (12.5 mg total) by mouth daily.   hydrOXYzine (VISTARIL) 25 MG capsule TAKE 1 CAPSULE BY MOUTH AT BEDTIME.   phentermine 37.5 MG capsule Take 1 capsule (37.5 mg total) by mouth daily.   topiramate (TOPAMAX) 25 MG tablet Take 1 tablet (25 mg total) by mouth at bedtime.   Varenicline Tartrate, Starter, (CHANTIX STARTING MONTH PAK) 0.5 MG X 11 & 1 MG X 42 TBPK Use as directed on box   Vitamin D, Ergocalciferol, (DRISDOL) 1.25 MG (50000 UNIT) CAPS capsule Take 1 capsule (50,000 Units total) by mouth 2 (two) times a week.   No facility-administered encounter medications on file as of 03/05/2023.      Observations/Objective: Middle age AAF in NAD  Assessment and Plan: 1. Morbid obesity (HCC) (Primary) -Reports doing okay and tolerating Phentermine and Topamax.  Continue meds Encourage her to weigh self periodically. Encourage her to get in some form of moderate intensity exercise outside of work   Follow Up Instructions: 3 mths   I discussed the assessment and treatment plan with the patient. The patient was provided an opportunity to ask questions and all were answered. The patient agreed with the plan and demonstrated an understanding of the instructions.   The patient was advised to call back or seek an in-person evaluation if the symptoms worsen or if the condition fails to improve as anticipated.  I spent 10 minutes dedicated  to the care of this patient on the date of this encounter to include previsit review of records, face to face time with pt discussing dx and management.  This note has been created with Education officer, environmental. Any transcriptional errors are unintentional.  Jonah Blue, MD

## 2023-03-07 ENCOUNTER — Other Ambulatory Visit: Payer: Self-pay | Admitting: Internal Medicine

## 2023-03-07 DIAGNOSIS — F419 Anxiety disorder, unspecified: Secondary | ICD-10-CM

## 2023-03-21 ENCOUNTER — Encounter: Payer: Self-pay | Admitting: Internal Medicine

## 2023-03-22 ENCOUNTER — Other Ambulatory Visit: Payer: Self-pay

## 2023-03-22 ENCOUNTER — Other Ambulatory Visit: Payer: Self-pay | Admitting: Internal Medicine

## 2023-03-22 MED ORDER — WEGOVY 0.5 MG/0.5ML ~~LOC~~ SOAJ
0.5000 mg | SUBCUTANEOUS | 0 refills | Status: DC
Start: 2023-03-22 — End: 2023-04-14

## 2023-03-23 ENCOUNTER — Telehealth: Payer: Self-pay

## 2023-03-23 NOTE — Telephone Encounter (Signed)
 Pharmacy Patient Advocate Encounter   Received notification from CoverMyMeds that prior authorization for wegovy is required/requested.   Insurance verification completed.   The patient is insured through [facility] .   Per test claim: PA required; PA submitted to above mentioned insurance via CoverMyMeds Key/confirmation #/EOC [id number] Status is pending

## 2023-03-25 ENCOUNTER — Other Ambulatory Visit: Payer: Self-pay

## 2023-03-25 NOTE — Telephone Encounter (Signed)
 Pharmacy Patient Advocate Encounter  Received notification from [facility] that Prior Authorization for wegovy has been DENIED.  Full denial letter will be uploaded to the media tab. See denial reason below.   PA #/Case ID/Reference #: [id number]  Per ins: [employer] reviewed your WEGOVY 0.5 MG/0.5 ML PEN request for the above-identified  member, and it is denied for the following reason: because we did not see what we need to  approve the drug you asked for, [facility]). We may be able to approve this drug in a certain  situation (when you have a baseline weight and body mass index [BMI] measured within the  past 45 days of this prior authorization request).  **message sent to provider about possibly scheduling and appointment for weight and bmi measurement

## 2023-03-26 ENCOUNTER — Encounter: Payer: Self-pay | Admitting: Internal Medicine

## 2023-03-27 ENCOUNTER — Other Ambulatory Visit: Payer: Self-pay | Admitting: Internal Medicine

## 2023-04-01 ENCOUNTER — Other Ambulatory Visit: Payer: Self-pay

## 2023-04-02 ENCOUNTER — Other Ambulatory Visit: Payer: Self-pay

## 2023-04-14 ENCOUNTER — Ambulatory Visit: Attending: Physician Assistant | Admitting: Physician Assistant

## 2023-04-14 ENCOUNTER — Encounter: Payer: Self-pay | Admitting: Physician Assistant

## 2023-04-14 VITALS — BP 148/83 | HR 75 | Temp 98.1°F | Ht 59.0 in | Wt 252.0 lb

## 2023-04-14 DIAGNOSIS — Z6841 Body Mass Index (BMI) 40.0 and over, adult: Secondary | ICD-10-CM | POA: Diagnosis not present

## 2023-04-14 DIAGNOSIS — R7303 Prediabetes: Secondary | ICD-10-CM | POA: Diagnosis not present

## 2023-04-14 MED ORDER — SEMAGLUTIDE-WEIGHT MANAGEMENT 0.25 MG/0.5ML ~~LOC~~ SOAJ: 0.2500 mg | SUBCUTANEOUS | 0 refills | Status: DC

## 2023-04-14 MED ORDER — SEMAGLUTIDE-WEIGHT MANAGEMENT 0.5 MG/0.5ML ~~LOC~~ SOAJ: 0.5000 mg | SUBCUTANEOUS | 1 refills | Status: DC

## 2023-04-14 NOTE — Progress Notes (Signed)
 Patient ID: Diane [name], female   DOB: November 09, 1975, 48 y.o.   MRN: [id number]   Diane [name], is a 48 y.o. female  OZH:[id number]  GEX:[id number]  DOB - 11-13-1975  Chief Complaint  Patient presents with   Medication Refill    Med refill for wegovy Vomiting X 1 day, congestion - rattle sound, cough        Subjective:   Diane [name] is a 48 y.o. female here today to restart wegovy.  It was helping her with weight loss.  Her BMI today is 50.7.  she was tolerating wegovy without any side effects but has been off of it a while now and will need to restart.     No problems updated.  ALLERGIES: Allergies  Allergen Reactions   Lisinopril Swelling    Lip swelling    PAST MEDICAL HISTORY: Past Medical History:  Diagnosis Date   Anxiety    Back pain    Cellulitis    Constipation    Elevated cholesterol    Hypertension    Joint pain    Overweight     MEDICATIONS AT HOME: Prior to Admission medications   Medication Sig Start Date End Date Taking? Authorizing Provider  amLODipine (NORVASC) 5 MG tablet Take 1 tablet (5 mg total) by mouth daily. 12/31/22  Yes Marcine Matar, MD  atorvastatin (LIPITOR) 20 MG tablet Take 1 tablet (20 mg total) by mouth daily. 12/31/22  Yes Marcine Matar, MD  hydrochlorothiazide (HYDRODIURIL) 12.5 MG tablet Take 1 tablet (12.5 mg total) by mouth daily. 12/31/22  Yes Marcine Matar, MD  hydrOXYzine (VISTARIL) 25 MG capsule TAKE 1 CAPSULE BY MOUTH EVERYDAY AT BEDTIME 03/09/23  Yes Marcine Matar, MD  Semaglutide-Weight Management 0.25 MG/0.5ML SOAJ Inject 0.25 mg into the skin once a week. 04/14/23  Yes Anders Simmonds, PA-C  Semaglutide-Weight Management 0.5 MG/0.5ML SOAJ Inject 0.5 mg into the skin once a week. 04/14/23  Yes Linc Renne, Marzella Schlein, PA-C  Varenicline Tartrate, Starter, (CHANTIX STARTING MONTH PAK) 0.5 MG X 11 & 1 MG X 42 TBPK Use as directed on box 12/29/21  Yes Marcine Matar, MD  Vitamin D,  Ergocalciferol, (DRISDOL) 1.25 MG (50000 UNIT) CAPS capsule Take 1 capsule (50,000 Units total) by mouth 2 (two) times a week. Patient not taking: Reported on 04/14/2023 02/20/21   Langston Reusing, MD    ROS: Neg HEENT Neg resp Neg cardiac Neg GI Neg GU Neg MS Neg psych Neg neuro  Objective:   Vitals:   04/14/23 0942  BP: (!) 148/83  Pulse: 75  Temp: 98.1 F (36.7 C)  TempSrc: Oral  SpO2: 100%  Weight: 252 lb (114.3 kg)  Height: 4\' 11"  (1.499 m)   Exam General appearance : Awake, alert, not in any distress. Speech Clear. Not toxic looking HEENT: Atraumatic and Normocephalic Neck: Supple, no JVD. No cervical lymphadenopathy.  Chest: Good air entry bilaterally, CTAB.  No rales/rhonchi/wheezing CVS: S1 S2 regular, no murmurs.  Extremities: B/L Lower Ext shows no edema, both legs are warm to touch Neurology: Awake alert, and oriented X 3, CN II-XII intact, Non focal Skin: No Rash  Data Review Lab Results  Component Value Date   HGBA1C 5.5 12/31/2022   HGBA1C 5.8 (H) 12/29/2021   HGBA1C 5.8 (H) 02/06/2021    Assessment & Plan   1. Morbid obesity (HCC) Wegovy 0.25 weekly X 4 weeks then titrate to 0.5 weekly for 4 weeks with 1 RF.   work at  a goal of eliminating sugary drinks, candy, desserts, sweets, refined sugars, processed foods, and white carbohydrates.  Drink 64 to 80 ounces water daily.  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  Increase exercise/be more active  2. Prediabetes (Primary) work at a goal of eliminating sugary drinks, candy, desserts, sweets, refined sugars, processed foods, and white carbohydrates.  Drink 64 to 80 ounces water daily.  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   Reviewed most recent labs from 12/2022   3. Adult BMI 50.0-59.9 kg/sq m (HCC) Wegovy 0.25 weekly X 4 weeks then titrate to 0.5 weekly for 4 weeks with 1 RF.   work at a goal of eliminating sugary drinks, candy, desserts, sweets, refined sugars, processed foods, and white carbohydrates.  Drink 64 to 80 ounces water  daily.  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  Increase exercise/be more active    Return in about 3 months (around 07/14/2023) for PCP for chronic conditions-[place].  The patient was given clear instructions to go to ER or return to medical center if symptoms don't improve, worsen or new problems develop. The patient verbalized understanding. The patient was told to call to get lab results if they haven't heard anything in the next week.      Georgian Co, PA-C [facility] [place], [place] [phone]   04/14/2023, 10:48 AM

## 2023-04-14 NOTE — Patient Instructions (Signed)
Drink 64 to 80 ounces water daily.   Work at a goal of eliminating sugary drinks, candy, desserts, sweets, refined sugars, processed foods, and white carbohydrates.

## 2023-04-15 ENCOUNTER — Other Ambulatory Visit: Payer: Self-pay

## 2023-04-15 ENCOUNTER — Telehealth: Payer: Self-pay

## 2023-04-15 NOTE — Telephone Encounter (Signed)
 Pharmacy Patient Advocate Encounter   Received notification from CoverMyMeds that prior authorization for wegovy is required/requested.   Insurance verification completed.   The patient is insured through [facility] .   Per test claim: PA required; PA submitted to above mentioned insurance via CoverMyMeds Key/confirmation #/EOC [id number] Status is pending

## 2023-04-23 ENCOUNTER — Ambulatory Visit (INDEPENDENT_AMBULATORY_CARE_PROVIDER_SITE_OTHER)

## 2023-04-23 ENCOUNTER — Encounter (HOSPITAL_COMMUNITY): Payer: Self-pay

## 2023-04-23 ENCOUNTER — Ambulatory Visit (HOSPITAL_COMMUNITY): Admission: EM | Admit: 2023-04-23 | Discharge: 2023-04-23 | Disposition: A | Discharge status: Home / Self Care

## 2023-04-23 ENCOUNTER — Ambulatory Visit: Payer: Self-pay

## 2023-04-23 DIAGNOSIS — R051 Acute cough: Secondary | ICD-10-CM

## 2023-04-23 DIAGNOSIS — R079 Chest pain, unspecified: Secondary | ICD-10-CM

## 2023-04-23 DIAGNOSIS — J069 Acute upper respiratory infection, unspecified: Secondary | ICD-10-CM

## 2023-04-23 DIAGNOSIS — R059 Cough, unspecified: Secondary | ICD-10-CM | POA: Diagnosis not present

## 2023-04-23 MED ORDER — PROMETHAZINE-DM 6.25-15 MG/5ML PO SYRP: 10.0000 mL | ORAL_SOLUTION | Freq: Four times a day (QID) | ORAL | 0 refills | Status: DC | PRN

## 2023-04-23 MED ORDER — PREDNISONE 10 MG (21) PO TBPK: ORAL_TABLET | Freq: Every day | ORAL | 0 refills | Status: DC

## 2023-04-23 MED ORDER — AZITHROMYCIN 500 MG PO TABS: 500.0000 mg | ORAL_TABLET | Freq: Every day | ORAL | 0 refills | Status: AC

## 2023-04-23 MED ORDER — MUCINEX DM MAXIMUM STRENGTH 60-1200 MG PO TB12: 1.0000 | ORAL_TABLET | Freq: Two times a day (BID) | ORAL | 0 refills | Status: AC

## 2023-04-23 NOTE — ED Provider Notes (Addendum)
 [facility]    CSN: [id number] Arrival date & time: 04/23/23  4098      History   Chief Complaint Chief Complaint  Patient presents with   Chest Pain    HPI Diane [name] is a 48 y.o. female.   Subjective:   Diane [name] is a 48 year old female presenting with chest pain and cough that began approximately one week ago. The chest pain is located in the left upper chest region, is non-radiating, constant, and described as mild and achy. The pain worsens with deep breathing, talking, and coughing. She also reports pain in the left thoracic back, which occurs only with deep breathing, talking, or coughing, and is described as sharp in nature. Her cough was initially productive but is now less so. She notes occasional wheezing at night when lying down, along with mild rhinorrhea. She reports a single episode of vomiting four days ago shortly after eating, and one episode of dizziness several days ago, with no recurrence of either symptom. She denies fevers, chills, body aches, nasal congestion, sneezing, nausea, diarrhea, visual changes, or headache. She has a history of bilateral carpal tunnel syndrome with baseline hand tingling, which she states is unchanged. She has tried Mucinex intermittently but has not taken any other medications consistently for her current symptoms. Past medical history includes hypertension, hyperlipidemia, and obesity, for which she is compliant with prescribed medications. She smokes cigarettes but is actively attempting to quit. She denies any history of myocardial infarction or heart disease.  The following portions of the patient's history were reviewed and updated as appropriate: allergies, current medications, past family history, past medical history, past social history, past surgical history, and problem list.       Past Medical History:  Diagnosis Date   Anxiety    Back pain    Cellulitis    Constipation    Elevated  cholesterol    Hypertension    Joint pain    Overweight     Patient Active Problem List   Diagnosis Date Noted   Prediabetes 02/18/2022   Tobacco dependence 10/31/2019   Knee pain, right 08/09/2019   Hyperlipidemia 03/24/2018   Essential hypertension 11/18/2017   Carpal tunnel syndrome of left wrist 11/18/2017   Anxiety 06/27/2012   Preventative health care 12/08/2011   Tobacco abuse 11/02/2011   Morbid obesity (HCC) 11/02/2011    Past Surgical History:  Procedure Laterality Date   CESAREAN SECTION  02-04-2010;  11-30-2006;  07-12-2001   DILITATION & CURRETTAGE/HYSTROSCOPY WITH HYDROTHERMAL ABLATION N/A 10/01/2016   Procedure: DILATATION & CURETTAGE/HYSTEROSCOPY WITH HYDROTHERMAL ABLATION;  Surgeon: Marcelle Overlie, MD;  Location: [facility];  Service: Gynecology;  Laterality: N/A;   LAPAROSCOPIC TUBAL LIGATION Bilateral 03/02/2014   Procedure: BILATERAL LAPAROSCOPIC TUBAL LIGATION;  Surgeon: Jeani Hawking, MD;  Location: [facility];  Service: Gynecology;  Laterality: Bilateral;   TUBAL LIGATION  2014   WISDOM TOOTH EXTRACTION      OB History     Gravida  6   Para  3   Term  3   Preterm      AB  3   Living  3      SAB  1   IAB  2   Ectopic      Multiple      Live Births               Home Medications    Prior to Admission medications   Medication Sig Start  Date End Date Taking? Authorizing Provider  azithromycin (ZITHROMAX) 500 MG tablet Take 1 tablet (500 mg total) by mouth daily for 5 days. 04/23/23 04/28/23 Yes Sheril [place], Lelon Mast, FNP  Dextromethorphan-guaiFENesin (MUCINEX DM MAXIMUM STRENGTH) 60-1200 MG TB12 Take 1 tablet by mouth 2 (two) times daily. 04/23/23  Yes Lurline Idol, FNP  phentermine 37.5 MG capsule Take 37.5 mg by mouth daily. 04/18/23  Yes [provider]  predniSONE (STERAPRED UNI-PAK 21 TAB) 10 MG (21) TBPK tablet Take by mouth daily. Take 6 tabs by mouth daily  for 2 days, then 5 tabs for 2 days, then 4  tabs for 2 days, then 3 tabs for 2 days, 2 tabs for 2 days, then 1 tab by mouth daily for 2 days 04/23/23  Yes Lurline Idol, FNP  promethazine-dextromethorphan (PROMETHAZINE-DM) 6.25-15 MG/5ML syrup Take 10 mLs by mouth every 6 (six) hours as needed for cough. 04/23/23  Yes Lurline Idol, FNP  amLODipine (NORVASC) 5 MG tablet Take 1 tablet (5 mg total) by mouth daily. 12/31/22   Marcine Matar, MD  atorvastatin (LIPITOR) 20 MG tablet Take 1 tablet (20 mg total) by mouth daily. 12/31/22   Marcine Matar, MD  hydrochlorothiazide (HYDRODIURIL) 12.5 MG tablet Take 1 tablet (12.5 mg total) by mouth daily. 12/31/22   Marcine Matar, MD  hydrOXYzine (VISTARIL) 25 MG capsule TAKE 1 CAPSULE BY MOUTH EVERYDAY AT BEDTIME 03/09/23   Marcine Matar, MD  Semaglutide-Weight Management 0.25 MG/0.5ML SOAJ Inject 0.25 mg into the skin once a week. 04/14/23   Anders Simmonds, PA-C  Semaglutide-Weight Management 0.5 MG/0.5ML SOAJ Inject 0.5 mg into the skin once a week. 04/14/23   Anders Simmonds, PA-C  Vitamin D, Ergocalciferol, (DRISDOL) 1.25 MG (50000 UNIT) CAPS capsule Take 1 capsule (50,000 Units total) by mouth 2 (two) times a week. Patient not taking: Reported on 04/14/2023 02/20/21   Langston Reusing, MD    Family History Family History  Problem Relation Age of Onset   Hyperlipidemia Mother    Cancer Mother        throat cancer   Hypertension Mother    Throat cancer Mother    Anxiety disorder Mother    Bipolar disorder Mother    Alcoholism Mother    Drug abuse Mother    Alcoholism Father    Hyperlipidemia Father    Hypertension Father    Heart disease Father 44   Stroke Father    Obesity Father    Diabetes Maternal Grandmother    Cancer Maternal Aunt        breast cancer   Cancer Maternal Aunt        breast cancer--in remision   Breast cancer Maternal Aunt    Pancreatic cancer Maternal Aunt    Cancer Maternal Uncle        prostate   Colon cancer Maternal Uncle     Cancer Maternal Uncle        throat cancer   Kidney disease Neg Hx     Social History Social History   Tobacco Use   Smoking status: Every Day    Current packs/day: 0.50    Average packs/day: 0.5 packs/day for 15.0 years (7.5 ttl pk-yrs)    Types: Cigarettes   Smokeless tobacco: Current   Tobacco comments:    pt trying to quit down to 3 -4 cig's daily from 1ppd  Substance Use Topics   Alcohol use: Yes    Comment:  occasionally   Drug use: No  Allergies   Lisinopril   Review of Systems Review of Systems  Constitutional:  Negative for activity change, appetite change, chills, diaphoresis, fatigue and fever.  HENT:  Positive for rhinorrhea. Negative for congestion, sneezing and sore throat.   Eyes:  Negative for visual disturbance.  Respiratory:  Positive for cough. Negative for shortness of breath and wheezing.   Cardiovascular:  Positive for chest pain. Negative for palpitations and leg swelling.  Gastrointestinal:  Positive for vomiting. Negative for abdominal pain, diarrhea and nausea.  Musculoskeletal:  Positive for back pain. Negative for myalgias.  Neurological:  Positive for dizziness. Negative for headaches.  All other systems reviewed and are negative.    Physical Exam Triage Vital Signs ED Triage Vitals  Encounter Vitals Group     BP 04/23/23 0842 (!) 137/90     Systolic BP Percentile --      Diastolic BP Percentile --      Pulse Rate 04/23/23 0842 79     Resp 04/23/23 0842 16     Temp 04/23/23 0842 98.1 F (36.7 C)     Temp Source 04/23/23 0842 Oral     SpO2 04/23/23 0842 96 %     Weight 04/23/23 0838 244 lb (110.7 kg)     Height 04/23/23 0838 4\' 11"  (1.499 m)     Head Circumference --      Peak Flow --      Pain Score 04/23/23 0838 8     Pain Loc --      Pain Education --      Exclude from Growth Chart --    No data found.  Updated Vital Signs BP (!) 137/90 (BP Location: Right Arm)   Pulse 79   Temp 98.1 F (36.7 C) (Oral)   Resp 16    Ht 4\' 11"  (1.499 m)   Wt 244 lb (110.7 kg)   LMP  (LMP Unknown)   SpO2 96%   BMI 49.28 kg/m   Visual Acuity Right Eye Distance:   Left Eye Distance:   Bilateral Distance:    Right Eye Near:   Left Eye Near:    Bilateral Near:     Physical Exam Vitals reviewed.  Constitutional:      General: She is awake. She is not in acute distress.    Appearance: Normal appearance. She is well-developed and well-groomed. She is obese. She is not ill-appearing, toxic-appearing or diaphoretic.  HENT:     Head: Normocephalic.     Mouth/Throat:     Mouth: Mucous membranes are moist.     Pharynx: Oropharynx is clear. Uvula midline.  Cardiovascular:     Rate and Rhythm: Normal rate and regular rhythm.     Heart sounds: Normal heart sounds.  Pulmonary:     Effort: Pulmonary effort is normal. No tachypnea or respiratory distress.     Breath sounds: Normal breath sounds. No decreased breath sounds or wheezing.  Chest:     Chest wall: Tenderness present.       Comments: Left chest tenderness with palpation noted  Musculoskeletal:        General: Normal range of motion.     Cervical back: Full passive range of motion without pain, normal range of motion and neck supple.     Thoracic back: Tenderness present. No swelling, edema, deformity, signs of trauma, lacerations, spasms or bony tenderness. Normal range of motion.     Right lower leg: No tenderness. No edema.     Left lower leg:  No tenderness. No edema.     Comments: Left-sided thoracic tenderness noted   Lymphadenopathy:     Cervical: No cervical adenopathy.  Skin:    General: Skin is warm and dry.  Neurological:     General: No focal deficit present.     Mental Status: She is alert and oriented to person, place, and time.     Motor: No weakness.  Psychiatric:        Mood and Affect: Mood normal. Mood is not anxious.        Behavior: Behavior normal. Behavior is cooperative.      UC Treatments / Results  Labs (all labs  ordered are listed, but only abnormal results are displayed) Labs Reviewed - No data to display  EKG   Radiology DG Chest 2 View Result Date: 04/23/2023 CLINICAL DATA:  Cough and chest pain for 1 week. EXAM: CHEST - 2 VIEW COMPARISON:  None Available. FINDINGS: The heart size and mediastinal contours are within normal limits. Both lungs are clear. The visualized skeletal structures are unremarkable. IMPRESSION: No active cardiopulmonary disease. Electronically Signed   By: Lupita Raider M.D.   On: 04/23/2023 11:06    Procedures Procedures (including critical care time)  Medications Ordered in UC Medications - No data to display  Initial Impression / Assessment and Plan / UC Course  I have reviewed the triage vital signs and the nursing notes.  Pertinent labs & imaging results that were available during my care of the patient were reviewed by me and considered in my medical decision making (see chart for details).    48 year old female with a history of hypertension, hyperlipidemia, and obesity, for which she is compliant with prescribed medications, presents with a one-week history of left-sided chest pain and left thoracic back pain associated with cough. Pain is worsened by deep breathing, talking, and coughing. She is alert, oriented, afebrile, and nontoxic. Physical exam as noted above with normal heart and lung sounds. Respirations are even and unlabored. Chest X-ray is negative for pneumonia or other abnormalities. Symptoms are most consistent with pleuritic or musculoskeletal chest pain related to a viral upper respiratory infection. Reassurance provided. Antibiotics, steroids, and cough medication prescribed. Supportive care measures recommended. Emergency precautions reviewed. Advised to follow up with primary care provider if symptoms do not improve after completing the prescribed course.  Today's evaluation has revealed no signs of a dangerous process. Discussed diagnosis with  patient and/or guardian. Patient and/or guardian aware of their diagnosis, possible red flag symptoms to watch out for and need for close follow up. Patient and/or guardian understands verbal and written discharge instructions. Patient and/or guardian comfortable with plan and disposition.  Patient and/or guardian has a clear mental status at this time, good insight into illness (after discussion and teaching) and has clear judgment to make decisions regarding their care  Documentation was completed with the aid of voice recognition software. Transcription may contain typographical errors. Final Clinical Impressions(s) / UC Diagnoses   Final diagnoses:  Acute cough  Upper respiratory tract infection, unspecified type  Nonspecific chest pain     Discharge Instructions      You were seen today for cough and chest pain.   Your chest pain is likely due to the strain from frequent coughing. This type of pain is usually musculoskeletal and caused by irritation or inflammation of the chest wall muscles used during coughing. It is typically mild, worsens with movement, deep breathing, or coughing, and improves as the cough  resolves.   Take the medications that were prescribed to you as directed. If you have a fever, headache, or body aches, you can also take Tylenol or ibuprofen to help you feel more comfortable. Be sure to drink plenty of fluids to stay hydrated--aim for enough to keep your urine a pale yellow color. This will also help to thin mucus and make it easier to clear from your body.   Using a cool mist humidifier at home to keep humidity levels above 50% can be helpful. You can also inhale steam for 10 to 15 minutes, 3 to 4 times a day. This can be done by sitting in the bathroom with a hot shower running, or by using over-the-counter vapor shower tablets to help with nasal congestion. Try to avoid cool or dry air as much as possible. When you sleep, keep your head elevated to help reduce  post-nasal drainage. Be sure to get enough rest every night to support your recovery. After you finish your antibiotics, don't forget to replace your toothbrush.  If your symptoms get worse or if you develop any new or concerning symptoms, go to the emergency room right away. If you're not feeling better in a few days, follow up with your primary care provider.     ED Prescriptions     Medication Sig Dispense Auth. Provider   predniSONE (STERAPRED UNI-PAK 21 TAB) 10 MG (21) TBPK tablet Take by mouth daily. Take 6 tabs by mouth daily  for 2 days, then 5 tabs for 2 days, then 4 tabs for 2 days, then 3 tabs for 2 days, 2 tabs for 2 days, then 1 tab by mouth daily for 2 days 42 tablet Sanita Estrada, Lelon Mast, FNP   azithromycin (ZITHROMAX) 500 MG tablet Take 1 tablet (500 mg total) by mouth daily for 5 days. 5 tablet Lurline Idol, FNP   promethazine-dextromethorphan (PROMETHAZINE-DM) 6.25-15 MG/5ML syrup Take 10 mLs by mouth every 6 (six) hours as needed for cough. 118 mL Leasha Goldberger, [place], FNP   Dextromethorphan-guaiFENesin (MUCINEX DM MAXIMUM STRENGTH) 60-1200 MG TB12 Take 1 tablet by mouth 2 (two) times daily. 20 tablet Lurline Idol, FNP      PDMP not reviewed this encounter.   Lurline Idol, FNP 04/23/23 1155    Lurline Idol, [place] 04/23/23 1155

## 2023-04-23 NOTE — Telephone Encounter (Signed)
 Copied from CRM [id number]. Topic: Clinical - Red Word Triage >> Apr 23, 2023  7:59 AM Franchot Heidelberg wrote: Red Word that prompted transfer to Nurse Triage: Chest pain, difficulty breathing, pain on left side, getting worse.  Chief Complaint: sob, cough with green sputum, runny nose, left side chest pain Symptoms: see above Frequency: for one week Pertinent Negatives: Patient denies fever, chills, arm pain Disposition: [] ED /[x] Urgent Care (no appt availability in office) / [] Appointment(In office/virtual)/ []  [place] [facility]/ [] Home Care/ [] Refused Recommended Disposition /[] [place] [facility]/ []  Follow-up with PCP Additional Notes: no apt available; instructed to go to uc; CARE ADVICE GIVEN, DENIES QUESTIONS; INSTRUCTED TO GO TO ER IF BECOMES WORSE.   Reason for Disposition  [1] MILD difficulty breathing (e.g., minimal/no SOB at rest, SOB with walking, pulse <100) AND [2] NEW-onset or WORSE than normal  Answer Assessment - Initial Assessment Questions 1. RESPIRATORY STATUS: "Describe your breathing?" (e.g., wheezing, shortness of breath, unable to speak, severe coughing)      Sob, difficulty breathing, pain on left side of check 2. ONSET: "When did this breathing problem begin?"      About a week 3. PATTERN "Does the difficult breathing come and go, or has it been constant since it started?"      constant 4. SEVERITY: "How bad is your breathing?" (e.g., mild, moderate, severe)    - MILD: No SOB at rest, mild SOB with walking, speaks normally in sentences, can lie down, no retractions, pulse < 100.    - MODERATE: SOB at rest, SOB with minimal exertion and prefers to sit, cannot lie down flat, speaks in phrases, mild retractions, audible wheezing, pulse 100-120.    - SEVERE: Very SOB at rest, speaks in single words, struggling to breathe, sitting hunched forward, retractions, pulse > 120      moderate 5. RECURRENT SYMPTOM: "Have you had difficulty breathing before?" If Yes, ask:  "When was the last time?" and "What happened that time?"      denies 6. CARDIAC HISTORY: "Do you have any history of heart disease?" (e.g., heart attack, angina, bypass surgery, angioplasty)      no 7. LUNG HISTORY: "Do you have any history of lung disease?"  (e.g., pulmonary embolus, asthma, emphysema)     no 8. CAUSE: "What do you think is causing the breathing problem?"      cold 9. OTHER SYMPTOMS: "Do you have any other symptoms? (e.g., dizziness, runny nose, cough, chest pain, fever)     Cough,green sputum, runny nose 10. O2 SATURATION MONITOR:  "Do you use an oxygen saturation monitor (pulse oximeter) at home?" If Yes, ask: "What is your reading (oxygen level) today?" "What is your usual oxygen saturation reading?" (e.g., 95%)       na 11. PREGNANCY: "Is there any chance you are pregnant?" "When was your last menstrual period?"       na 12. TRAVEL: "Have you traveled out of the country in the last month?" (e.g., travel history, exposures)       no  Protocols used: Breathing Difficulty-A-AH

## 2023-04-23 NOTE — Discharge Instructions (Addendum)
 You were seen today for cough and chest pain.   Your chest pain is likely due to the strain from frequent coughing. This type of pain is usually musculoskeletal and caused by irritation or inflammation of the chest wall muscles used during coughing. It is typically mild, worsens with movement, deep breathing, or coughing, and improves as the cough resolves.   Take the medications that were prescribed to you as directed. If you have a fever, headache, or body aches, you can also take Tylenol or ibuprofen to help you feel more comfortable. Be sure to drink plenty of fluids to stay hydrated--aim for enough to keep your urine a pale yellow color. This will also help to thin mucus and make it easier to clear from your body.   Using a cool mist humidifier at home to keep humidity levels above 50% can be helpful. You can also inhale steam for 10 to 15 minutes, 3 to 4 times a day. This can be done by sitting in the bathroom with a hot shower running, or by using over-the-counter vapor shower tablets to help with nasal congestion. Try to avoid cool or dry air as much as possible. When you sleep, keep your head elevated to help reduce post-nasal drainage. Be sure to get enough rest every night to support your recovery. After you finish your antibiotics, don't forget to replace your toothbrush.  If your symptoms get worse or if you develop any new or concerning symptoms, go to the emergency room right away. If you're not feeling better in a few days, follow up with your primary care provider.

## 2023-04-23 NOTE — Telephone Encounter (Signed)
 Noted. Patient currently being seen at urgent care per epic.

## 2023-04-23 NOTE — ED Triage Notes (Signed)
 Patient here today with c/o left side chest pain, productive cough, and SOB X 1 week. She has been taking Mucinex with little relief. Denies fever. No known sick contacts.

## 2023-05-07 ENCOUNTER — Ambulatory Visit (INDEPENDENT_AMBULATORY_CARE_PROVIDER_SITE_OTHER): Payer: Self-pay | Admitting: Obstetrics and Gynecology

## 2023-05-07 ENCOUNTER — Encounter: Payer: Self-pay | Admitting: Obstetrics and Gynecology

## 2023-05-07 ENCOUNTER — Other Ambulatory Visit (HOSPITAL_COMMUNITY)
Admission: RE | Admit: 2023-05-07 | Discharge: 2023-05-07 | Disposition: A | Discharge status: Home / Self Care | Source: Ambulatory Visit | Attending: Obstetrics and Gynecology | Admitting: Obstetrics and Gynecology

## 2023-05-07 ENCOUNTER — Telehealth (HOSPITAL_BASED_OUTPATIENT_CLINIC_OR_DEPARTMENT_OTHER): Payer: Self-pay

## 2023-05-07 VITALS — BP 124/77 | HR 72 | Ht 59.0 in | Wt 243.0 lb

## 2023-05-07 DIAGNOSIS — Z01419 Encounter for gynecological examination (general) (routine) without abnormal findings: Secondary | ICD-10-CM

## 2023-05-07 NOTE — Progress Notes (Signed)
 Subjective:     Diane [name] is a 48 y.o. female P3 with amenorrhea secondary to endometrial ablation and BMI 49 who is here for a comprehensive physical exam. The patient reports no problems. She is sexually active without complaints. She denies any vaginal bleeding since ablation. She denies pelvic pain or abnormal discharge. She denies urinary incontinence. Patient reports occasional vasomotor symptoms. Patient is without any other complaints  Past Medical History:  Diagnosis Date   Anxiety    Back pain    Cellulitis    Constipation    Elevated cholesterol    Hypertension    Joint pain    Overweight    Past Surgical History:  Procedure Laterality Date   CESAREAN SECTION  02-04-2010;  11-30-2006;  07-12-2001   DILITATION & CURRETTAGE/HYSTROSCOPY WITH HYDROTHERMAL ABLATION N/A 10/01/2016   Procedure: DILATATION & CURETTAGE/HYSTEROSCOPY WITH HYDROTHERMAL ABLATION;  Surgeon: Thurman Flores, MD;  Location: [facility];  Service: Gynecology;  Laterality: N/A;   LAPAROSCOPIC TUBAL LIGATION Bilateral 03/02/2014   Procedure: BILATERAL LAPAROSCOPIC TUBAL LIGATION;  Surgeon: Martine Sleek, MD;  Location: [facility];  Service: Gynecology;  Laterality: Bilateral;   TUBAL LIGATION  2014   WISDOM TOOTH EXTRACTION     Family History  Problem Relation Age of Onset   Hyperlipidemia Mother    Cancer Mother        throat cancer   Hypertension Mother    Throat cancer Mother    Anxiety disorder Mother    Bipolar disorder Mother    Alcoholism Mother    Drug abuse Mother    Alcoholism Father    Hyperlipidemia Father    Hypertension Father    Heart disease Father 41   Stroke Father    Obesity Father    Diabetes Maternal Grandmother    Cancer Maternal Aunt        breast cancer   Cancer Maternal Aunt        breast cancer--in remision   Breast cancer Maternal Aunt    Pancreatic cancer Maternal Aunt    Cancer Maternal Uncle        prostate   Colon cancer Maternal Uncle     Cancer Maternal Uncle        throat cancer   Kidney disease Neg Hx      Social History   Socioeconomic History   Marital status: Single    Spouse name: Not on file   Number of children: 4   Years of education: Not on file   Highest education level: Some college, no degree  Occupational History   Occupation: [employer]  Tobacco Use   Smoking status: Every Day    Current packs/day: 0.50    Average packs/day: 0.5 packs/day for 15.0 years (7.5 ttl pk-yrs)    Types: Cigarettes   Smokeless tobacco: Current   Tobacco comments:    pt trying to quit down to 3 -4 cig's daily from 1ppd  Substance and Sexual Activity   Alcohol use: Yes    Comment:  occasionally   Drug use: No   Sexual activity: Yes    Birth control/protection: Surgical  Other Topics Concern   Not on file  Social History Narrative   [employer]- works from home.     3 children- (23 year old adopted child) 10, 4 and 1.    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)   Single   Lives with sister and children   Social Drivers of Health   Financial  Resource Strain: Medium Risk (04/14/2023)   Overall Financial Resource Strain (CARDIA)    Difficulty of Paying Living Expenses: Somewhat hard  Food Insecurity: Food Insecurity Present (04/14/2023)   Hunger Vital Sign    Worried About Running Out of Food in the Last Year: Sometimes true    Ran Out of Food in the Last Year: Sometimes true  Transportation Needs: No Transportation Needs (04/14/2023)   PRAPARE - Administrator, Civil Service (Medical): No    Lack of Transportation (Non-Medical): No  Physical Activity: Insufficiently Active (04/14/2023)   Exercise Vital Sign    Days of Exercise per Week: 2 days    Minutes of Exercise per Session: 20 min  Stress: No Stress Concern Present (04/14/2023)   [employer] of Occupational Health - Occupational Stress Questionnaire    Feeling of Stress : Only a little  Social Connections: Moderately Integrated (04/14/2023)    Social Connection and Isolation Panel [NHANES]    Frequency of Communication with Friends and Family: More than three times a week    Frequency of Social Gatherings with Friends and Family: Twice a week    Attends Religious Services: 1 to 4 times per year    Active Member of [employer] or Organizations: Yes    Attends Banker Meetings: More than 4 times per year    Marital Status: Never married  Intimate Partner Violence: Not At Risk (12/31/2022)   Humiliation, Afraid, Rape, and Kick questionnaire    Fear of Current or Ex-Partner: No    Emotionally Abused: No    Physically Abused: No    Sexually Abused: No   Health Maintenance  Topic Date Due   Pneumococcal Vaccine 77-51 Years old (1 of 2 - PCV) Never done   Cervical Cancer Screening (HPV/Pap Cotest)  12/20/2016   COVID-19 Vaccine (4 - 2024-25 season) 09/13/2022   INFLUENZA VACCINE  08/13/2023   MAMMOGRAM  07/22/2024   Colonoscopy  07/06/2027   DTaP/Tdap/Td (3 - Td or Tdap) 06/21/2032   Hepatitis C Screening  Completed   HIV Screening  Completed   HPV VACCINES  Aged Out   Meningococcal B Vaccine  Aged Out       Review of Systems Pertinent items noted in HPI and remainder of comprehensive ROS otherwise negative.   Objective:  Blood pressure 124/77, pulse 72, height 4\' 11"  (1.499 m), weight 243 lb (110.2 kg).   GENERAL: Well-developed, well-nourished female in no acute distress.  HEENT: Normocephalic, atraumatic. Sclerae anicteric.  NECK: Supple. Normal thyroid.  LUNGS: Clear to auscultation bilaterally.  HEART: Regular rate and rhythm. BREASTS: Symmetric in size. No palpable masses or lymphadenopathy, skin changes, or nipple drainage. ABDOMEN: Soft, nontender, nondistended. No organomegaly. PELVIC: Normal external female genitalia. Vagina is pink and rugated.  Normal discharge. Normal appearing cervix. Bimanual exam limited secondary to body habitus. No adnexal mass or tenderness. Chaperone present during the  pelvic exam EXTREMITIES: No cyanosis, clubbing, or edema, 2+ distal pulses.     Assessment:    Healthy female exam.     Plan:    Pap smear collected STI screening per patient request Screening mammogram ordered Patient will be contacted with abnormal results Patient currently working on weight loss with GLP-1. Declined referral to nutritionist for now Patient is current on colonoscopy See After Visit Summary for Counseling Recommendations

## 2023-05-08 LAB — RPR: RPR Ser Ql: NONREACTIVE

## 2023-05-08 LAB — HEPATITIS B SURFACE ANTIGEN: Hepatitis B Surface Ag: NEGATIVE

## 2023-05-08 LAB — HIV ANTIBODY (ROUTINE TESTING W REFLEX): HIV Screen 4th Generation wRfx: NONREACTIVE

## 2023-05-08 LAB — HEPATITIS C ANTIBODY: Hep C Virus Ab: NONREACTIVE

## 2023-05-11 LAB — CYTOLOGY - PAP
Adequacy: ABSENT
Chlamydia: NEGATIVE
Comment: NEGATIVE
Comment: NEGATIVE
Comment: NEGATIVE
Comment: NEGATIVE
Comment: NORMAL
Diagnosis: NEGATIVE
HPV 16: NEGATIVE
HPV 18 / 45: NEGATIVE
High risk HPV: POSITIVE — AB
Neisseria Gonorrhea: NEGATIVE

## 2023-05-12 ENCOUNTER — Encounter: Payer: Self-pay | Admitting: Obstetrics and Gynecology

## 2023-05-27 ENCOUNTER — Telehealth (HOSPITAL_BASED_OUTPATIENT_CLINIC_OR_DEPARTMENT_OTHER): Payer: Self-pay

## 2023-06-27 ENCOUNTER — Other Ambulatory Visit: Payer: Self-pay | Admitting: Internal Medicine

## 2023-06-27 DIAGNOSIS — I10 Essential (primary) hypertension: Secondary | ICD-10-CM

## 2023-07-05 ENCOUNTER — Other Ambulatory Visit: Payer: Self-pay | Admitting: Internal Medicine

## 2023-07-05 ENCOUNTER — Other Ambulatory Visit: Payer: Self-pay | Admitting: Physician Assistant

## 2023-07-07 ENCOUNTER — Encounter: Payer: Self-pay | Admitting: Internal Medicine

## 2023-07-14 ENCOUNTER — Other Ambulatory Visit: Payer: Self-pay | Admitting: Internal Medicine

## 2023-07-14 ENCOUNTER — Other Ambulatory Visit: Payer: Self-pay | Admitting: Pharmacist

## 2023-07-14 MED ORDER — WEGOVY 1 MG/0.5ML ~~LOC~~ SOAJ: 1.0000 mg | SUBCUTANEOUS | 0 refills | Status: DC

## 2023-07-15 ENCOUNTER — Encounter: Payer: Self-pay | Admitting: Internal Medicine

## 2023-07-15 ENCOUNTER — Ambulatory Visit: Attending: Internal Medicine | Admitting: Internal Medicine

## 2023-07-15 DIAGNOSIS — Z6841 Body Mass Index (BMI) 40.0 and over, adult: Secondary | ICD-10-CM

## 2023-07-15 DIAGNOSIS — F17200 Nicotine dependence, unspecified, uncomplicated: Secondary | ICD-10-CM

## 2023-07-15 DIAGNOSIS — I10 Essential (primary) hypertension: Secondary | ICD-10-CM | POA: Diagnosis not present

## 2023-07-15 DIAGNOSIS — R7303 Prediabetes: Secondary | ICD-10-CM | POA: Diagnosis not present

## 2023-07-15 DIAGNOSIS — Z23 Encounter for immunization: Secondary | ICD-10-CM

## 2023-07-15 DIAGNOSIS — F17210 Nicotine dependence, cigarettes, uncomplicated: Secondary | ICD-10-CM | POA: Diagnosis not present

## 2023-07-15 DIAGNOSIS — E785 Hyperlipidemia, unspecified: Secondary | ICD-10-CM | POA: Diagnosis not present

## 2023-07-15 DIAGNOSIS — Z7985 Long-term (current) use of injectable non-insulin antidiabetic drugs: Secondary | ICD-10-CM | POA: Diagnosis not present

## 2023-07-15 DIAGNOSIS — G5603 Carpal tunnel syndrome, bilateral upper limbs: Secondary | ICD-10-CM

## 2023-07-15 LAB — GLUCOSE, POCT (MANUAL RESULT ENTRY): POC Glucose: 97 mg/dL (ref 70–99)

## 2023-07-15 LAB — POCT GLYCOSYLATED HEMOGLOBIN (HGB A1C): HbA1c, POC (prediabetic range): 5.4 % — AB (ref 5.7–6.4)

## 2023-07-15 MED ORDER — ATORVASTATIN CALCIUM 20 MG PO TABS: 20.0000 mg | ORAL_TABLET | Freq: Every day | ORAL | 3 refills | Status: AC

## 2023-07-15 MED ORDER — WEGOVY 1 MG/0.5ML ~~LOC~~ SOAJ
1.0000 mg | SUBCUTANEOUS | 0 refills | Status: DC
Start: 2023-07-15 — End: 2023-10-18

## 2023-07-15 MED ORDER — AMLODIPINE BESYLATE 5 MG PO TABS
5.0000 mg | ORAL_TABLET | Freq: Every day | ORAL | 1 refills | Status: AC
Start: 2023-07-15 — End: ?

## 2023-07-15 NOTE — Patient Instructions (Signed)
 VISIT SUMMARY:  Today, we reviewed your progress with weight management, hypertension, cholesterol, and other health concerns. We discussed your current medications, lifestyle habits, and provided recommendations to improve your overall health.  YOUR PLAN:  -OBESITY: Obesity means having an excessive amount of body fat. Your weight has remained stable at 244 pounds while on Wegovy , which has helped reduce your appetite. Continue taking Wegovy  1 mg and increase to 2 mg after one month if tolerated. Try to eat healthier meals at regular times, choose healthier snacks, and stay hydrated. Additionally, incorporate regular physical activity outside of work.  -HYPERTENSION: Hypertension is high blood pressure. Your blood pressure is well-controlled with your current medications. Continue taking amlodipine  5 mg daily and hydrochlorothiazide  12.5 mg daily. Keep limiting your salt intake.  -HYPERLIPIDEMIA: Hyperlipidemia means having high levels of fats (cholesterol) in your blood. Continue taking rosuvastatin 20 mg daily to manage your cholesterol levels.  -CARPAL TUNNEL SYNDROME: Carpal tunnel syndrome is a condition that causes numbness, tingling, or weakness in your hand. You have moderate to severe symptoms in both hands. Continue using the right hand splint and obtain a wrist splint for your left hand from a medical supply store. We can discuss a potential referral for surgery when you are ready.  -TOBACCO USE: Smoking tobacco is harmful to your health. You acknowledge the need to quit and are encouraged by your children. We strongly encourage you to stop smoking.  -GENERAL HEALTH MAINTENANCE: Your routine labs are normal, and you are no longer in the prediabetes range. Due to frequent hospital exposure from your job, you received the Prevnar 20 vaccine today and started the hepatitis B vaccine series. Schedule a nurse visit for the second hepatitis B vaccine in one month.  INSTRUCTIONS:  Please  schedule a nurse visit for your second hepatitis B vaccine in one month.

## 2023-07-15 NOTE — Progress Notes (Addendum)
 Patient ID: Diane [name], female    DOB: 09-27-1975  MRN: [id number]  CC: Hypertension (HTN & pre-diabetes f/u. Med refill. /On-going pain of both hands /Yes to pneumonia & hep b vax)   Subjective: Diane [name] is a 48 y.o. female who presents for chronic ds management. Her concerns today include:  Patient with history of tob dep, morbid obesity, preDM, HTN, HL, CTS and anxiety   Discussed the use of AI scribe software for clinical note transcription with the patient, who gave verbal consent to proceed.  History of Present Illness Diane [name] is a 48 year old female with obesity and hypertension who presents for follow-up on weight management and medication review.  Obesity/PreDM:  Results for orders placed or performed in visit on 07/15/23  POCT glucose (manual entry)   Collection Time: 07/15/23  9:35 AM  Result Value Ref Range   POC Glucose 97 70 - 99 mg/dl  POCT glycosylated hemoglobin (Hb A1C)   Collection Time: 07/15/23 10:09 AM  Result Value Ref Range   Hemoglobin A1C     HbA1c POC (<> result, manual entry)     HbA1c, POC (prediabetic range) 5.4 (A) 5.7 - 6.4 %   HbA1c, POC (controlled diabetic range)      She restarted Wegovy  in April for weight management. Her weight has remained stable at 244 pounds. Wegovy  has decreased her appetite, allowing her to go half a day without hunger. She is about to start a 1 mg dose.  She is wondering about whether she can be on both the Ozempic  and the phentermine .  We previously had her on phentermine  and Topamax  but once her insurance approve the Ozempic , I discontinued filling for the phentermine .  Her eating habits are irregular due to a busy schedule, often eating once or twice a day. She feels she can do better with choosing healthier snacks; snacks while she is driving.  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  She takes amlodipine  5 mg and hydrochlorothiazide  12.5 mg for hypertension, with no chest pain or shortness of breath. She is on atorvastatin  20 mg for cholesterol management, which she confirmed she is still taking.  She continues to smoke cigarettes.  Continues to be bothered with carpal tunnel syndrome symptoms in both hands that she feels is getting a little worse.  She had EMG in the past that showed moderate to severe bilateral carpal tunnel.  She has seen orthopedics Dr.Xu last year and plan was to proceed with surgery but she subsequently decided not to go through with it as she needed to work.  She has a wrist splint for the right hand but does not have one for the left.    Patient Active Problem List   Diagnosis Date Noted   Prediabetes 02/18/2022   Tobacco dependence 10/31/2019   Knee pain, right 08/09/2019   Hyperlipidemia 03/24/2018   Essential hypertension 11/18/2017   Carpal tunnel syndrome of left wrist 11/18/2017   Anxiety 06/27/2012   Preventative health care 12/08/2011   Tobacco abuse 11/02/2011   Morbid obesity (HCC) 11/02/2011     Current Outpatient Medications on File Prior to Visit  Medication Sig Dispense Refill   amLODipine  (NORVASC ) 5  MG tablet Take 1 tablet (5 mg total) by mouth daily. 90 tablet 1   atorvastatin  (LIPITOR) 20 MG tablet Take 1 tablet (20 mg total) by mouth daily. 90 tablet 3   Dextromethorphan-guaiFENesin  (MUCINEX  DM MAXIMUM STRENGTH) 60-1200 MG TB12 Take 1 tablet by mouth 2 (two) times daily. 20 tablet 0   hydrochlorothiazide  (HYDRODIURIL ) 12.5 MG tablet TAKE 1 TABLET BY MOUTH EVERY DAY 90 tablet 2   hydrOXYzine  (VISTARIL ) 25 MG capsule TAKE 1 CAPSULE BY MOUTH EVERYDAY AT BEDTIME 90 capsule 1   phentermine  37.5 MG capsule Take 37.5 mg by mouth daily.     Semaglutide -Weight Management (WEGOVY ) 1  MG/0.5ML SOAJ Inject 1 mg into the skin once a week. 6 mL 0   predniSONE  (STERAPRED UNI-PAK 21 TAB) 10 MG (21) TBPK tablet Take by mouth daily. Take 6 tabs by mouth daily  for 2 days, then 5 tabs for 2 days, then 4 tabs for 2 days, then 3 tabs for 2 days, 2 tabs for 2 days, then 1 tab by mouth daily for 2 days (Patient not taking: Reported on 07/15/2023) 42 tablet 0   promethazine -dextromethorphan (PROMETHAZINE -DM) 6.25-15 MG/5ML syrup Take 10 mLs by mouth every 6 (six) hours as needed for cough. (Patient not taking: Reported on 07/15/2023) 118 mL 0   Vitamin D , Ergocalciferol , (DRISDOL ) 1.25 MG (50000 UNIT) CAPS capsule Take 1 capsule (50,000 Units total) by mouth 2 (two) times a week. (Patient not taking: Reported on 07/15/2023) 8 capsule 0   No current facility-administered medications on file prior to visit.    Allergies  Allergen Reactions   Lisinopril  Swelling    Lip swelling    Social History   Socioeconomic History   Marital status: Single    Spouse name: Not on file   Number of children: 4   Years of education: Not on file   Highest education level: Some college, no degree  Occupational History   Occupation: [employer]  Tobacco Use   Smoking status: Every Day    Current packs/day: 0.50    Average packs/day: 0.5 packs/day for 15.0 years (7.5 ttl pk-yrs)    Types: Cigarettes   Smokeless tobacco: Current   Tobacco comments:    pt trying to quit down to 3 -4 cig's daily from 1ppd  Substance and Sexual Activity   Alcohol use: Yes    Comment:  occasionally   Drug use: No   Sexual activity: Yes    Birth control/protection: Surgical  Other Topics Concern   Not on file  Social History Narrative   [employer]- works from home.     3 children- (31 year old adopted child) 10, 4 and 1.    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)   Single   Lives with sister and children   Social Drivers of Health   Financial Resource Strain: Medium Risk (04/14/2023)   Overall Financial  Resource Strain (CARDIA)    Difficulty of Paying Living Expenses: Somewhat hard  Food Insecurity: Food Insecurity Present (04/14/2023)   Hunger Vital Sign    Worried About Running Out of Food in the Last Year: Sometimes true    Ran Out of Food in the Last Year: Sometimes true  Transportation Needs: No Transportation Needs (04/14/2023)   PRAPARE - Administrator, Civil Service (Medical): No    Lack of Transportation (Non-Medical): No  Physical Activity: Insufficiently Active (04/14/2023)   Exercise Vital Sign    Days of Exercise per Week: 2 days  Minutes of Exercise per Session: 20 min  Stress: No Stress Concern Present (04/14/2023)   [employer] of Occupational Health - Occupational Stress Questionnaire    Feeling of Stress : Only a little  Social Connections: Moderately Integrated (04/14/2023)   Social Connection and Isolation Panel    Frequency of Communication with Friends and Family: More than three times a week    Frequency of Social Gatherings with Friends and Family: Twice a week    Attends Religious Services: 1 to 4 times per year    Active Member of [employer] or Organizations: Yes    Attends Banker Meetings: More than 4 times per year    Marital Status: Never married  Intimate Partner Violence: Not At Risk (12/31/2022)   Humiliation, Afraid, Rape, and Kick questionnaire    Fear of Current or Ex-Partner: No    Emotionally Abused: No    Physically Abused: No    Sexually Abused: No    Family History  Problem Relation Age of Onset   Hyperlipidemia Mother    Cancer Mother        throat cancer   Hypertension Mother    Throat cancer Mother    Anxiety disorder Mother    Bipolar disorder Mother    Alcoholism Mother    Drug abuse Mother    Alcoholism Father    Hyperlipidemia Father    Hypertension Father    Heart disease Father 21   Stroke Father    Obesity Father    Diabetes Maternal Grandmother    Cancer Maternal Aunt        breast cancer    Cancer Maternal Aunt        breast cancer--in remision   Breast cancer Maternal Aunt    Pancreatic cancer Maternal Aunt    Cancer Maternal Uncle        prostate   Colon cancer Maternal Uncle    Cancer Maternal Uncle        throat cancer   Kidney disease Neg Hx     Past Surgical History:  Procedure Laterality Date   CESAREAN SECTION  02-04-2010;  11-30-2006;  07-12-2001   DILITATION & CURRETTAGE/HYSTROSCOPY WITH HYDROTHERMAL ABLATION N/A 10/01/2016   Procedure: DILATATION & CURETTAGE/HYSTEROSCOPY WITH HYDROTHERMAL ABLATION;  Surgeon: Mat Browning, MD;  Location: [facility];  Service: Gynecology;  Laterality: N/A;   LAPAROSCOPIC TUBAL LIGATION Bilateral 03/02/2014   Procedure: BILATERAL LAPAROSCOPIC TUBAL LIGATION;  Surgeon: Browning LITTIE Mat, MD;  Location: [facility] [place];  Service: Gynecology;  Laterality: Bilateral;   TUBAL LIGATION  2014   WISDOM TOOTH EXTRACTION      ROS: Review of Systems Negative except as stated above  PHYSICAL EXAM: BP 110/75 (BP Location: Left Arm, Patient Position: Sitting, Cuff Size: Large)   Pulse 79   Temp 98.2 F (36.8 C) (Oral)   Ht 4' 11 (1.499 m)   Wt 244 lb (110.7 kg)   SpO2 99%   BMI 49.28 kg/m   Wt Readings from Last 3 Encounters:  07/15/23 244 lb (110.7 kg)  05/07/23 243 lb (110.2 kg)  04/23/23 244 lb (110.7 kg)    Physical Exam  General appearance - alert, well appearing, morbidly obese African-American female and in no distress Mental status - normal mood, behavior, speech, dress, motor activity, and thought processes Neck - supple, no significant adenopathy Chest - clear to auscultation, no wheezes, rales or rhonchi, symmetric air entry Heart - normal rate, regular rhythm, normal S1, S2, no murmurs, rubs,  clicks or gallops Extremities - peripheral pulses normal, no pedal edema, no clubbing or cyanosis  Results for orders placed or performed in visit on 07/15/23  POCT glucose (manual entry)   Collection Time:  07/15/23  9:35 AM  Result Value Ref Range   POC Glucose 97 70 - 99 mg/dl  POCT glycosylated hemoglobin (Hb A1C)   Collection Time: 07/15/23 10:09 AM  Result Value Ref Range   Hemoglobin A1C     HbA1c POC (<> result, manual entry)     HbA1c, POC (prediabetic range) 5.4 (A) 5.7 - 6.4 %   HbA1c, POC (controlled diabetic range)         Latest Ref Rng & Units 12/31/2022    4:45 PM 06/22/2022    4:02 PM 03/19/2022    3:20 PM  CMP  Glucose 70 - 99 mg/dL 97   82   BUN 6 - 24 mg/dL 12   12   Creatinine 9.42 - 1.00 mg/dL 9.35   9.32   Sodium 865 - 144 mmol/L 140   140   Potassium 3.5 - 5.2 mmol/L 4.3   3.4   Chloride 96 - 106 mmol/L 101   100   CO2 20 - 29 mmol/L 23   23   Calcium  8.7 - 10.2 mg/dL 9.5   9.2   Total Protein 6.0 - 8.5 g/dL 7.9  7.6    Total Bilirubin 0.0 - 1.2 mg/dL 0.2  0.2    Alkaline Phos 44 - 121 IU/L 84  68    AST 0 - 40 IU/L 18  16    ALT 0 - 32 IU/L 15  10     Lipid Panel     Component Value Date/Time   CHOL 168 12/31/2022 1645   TRIG 55 12/31/2022 1645   HDL 65 12/31/2022 1645   CHOLHDL 2.6 12/31/2022 1645   CHOLHDL 4.7 11/02/2011 1051   VLDL 16 11/02/2011 1051   LDLCALC 92 12/31/2022 1645    CBC    Component Value Date/Time   WBC 4.8 12/31/2022 1645   WBC 8.0 02/16/2018 1829   RBC 4.52 12/31/2022 1645   RBC 4.73 02/16/2018 1829   HGB 13.5 12/31/2022 1645   HCT 42.6 12/31/2022 1645   PLT 324 12/31/2022 1645   MCV 94 12/31/2022 1645   MCH 29.9 12/31/2022 1645   MCH 29.8 02/16/2018 1829   MCHC 31.7 12/31/2022 1645   MCHC 32.3 02/16/2018 1829   RDW 13.0 12/31/2022 1645   LYMPHSABS 1.3 02/06/2021 0930   MONOABS 0.3 02/16/2018 1829   EOSABS 0.1 02/06/2021 0930   BASOSABS 0.0 02/06/2021 0930    ASSESSMENT AND PLAN: 1. Morbid obesity (HCC) (Primary) Discussed and encouraged healthy eating habits.  Meal planning encourage and healthier snacks like fruits or nuts.  She will start on the 1 mg dose of the Wegovy .  After being on it for 1 month, if  she is tolerating it, she can send me a MyChart message to let me know so that we can then increase to the 2 mg dose. Encouraged her to move as much as she can - Semaglutide -Weight Management (WEGOVY ) 1 MG/0.5ML SOAJ; Inject 1 mg into the skin once a week.  Dispense: 6 mL; Refill: 0  2. Prediabetes No longer in prediabetes range.  See #1 above - POCT glycosylated hemoglobin (Hb A1C) - POCT glucose (manual entry)  3. Essential hypertension At goal.  Continue amlodipine  5 mg daily and HCTZ 12.5 mg daily - amLODipine  (  NORVASC ) 5 MG tablet; Take 1 tablet (5 mg total) by mouth daily.  Dispense: 90 tablet; Refill: 1  4. Hyperlipidemia, unspecified hyperlipidemia type - atorvastatin  (LIPITOR) 20 MG tablet; Take 1 tablet (20 mg total) by mouth daily.  Dispense: 90 tablet; Refill: 3  5. Tobacco dependence Strongly encouraged her to quit smoking.  6. Carpal tunnel syndrome, bilateral At some point she would like to consider having surgery but cannot afford to be off work at this time for recovery so we will defer on that.  Encouraged her to get a wrist splint for the left hand as well.  7. Need for vaccination against Streptococcus pneumoniae - Pneumococcal conjugate vaccine 20-valent  8. Need for hepatitis B vaccination Patient agreeable to receiving the hepatitis B vaccine series.  First shot given today.  Patient was given the opportunity to ask questions.  Patient verbalized understanding of the plan and was able to repeat key elements of the plan.   This documentation was completed using Paediatric nurse.  Any transcriptional errors are unintentional.  Orders Placed This Encounter  Procedures   POCT glycosylated hemoglobin (Hb A1C)   POCT glucose (manual entry)     Requested Prescriptions   Pending Prescriptions Disp Refills   amLODipine  (NORVASC ) 5 MG tablet 90 tablet 1    Sig: Take 1 tablet (5 mg total) by mouth daily.   Semaglutide -Weight Management  (WEGOVY ) 1 MG/0.5ML SOAJ 6 mL 0    Sig: Inject 1 mg into the skin once a week.    No follow-ups on file.  Barnie Louder, MD, FACP

## 2023-07-23 ENCOUNTER — Other Ambulatory Visit: Payer: Self-pay | Admitting: Internal Medicine

## 2023-07-23 DIAGNOSIS — F419 Anxiety disorder, unspecified: Secondary | ICD-10-CM

## 2023-08-16 ENCOUNTER — Ambulatory Visit

## 2023-08-19 ENCOUNTER — Encounter: Payer: Self-pay | Admitting: Internal Medicine

## 2023-08-23 ENCOUNTER — Ambulatory Visit

## 2023-09-15 ENCOUNTER — Other Ambulatory Visit: Payer: Self-pay | Admitting: Internal Medicine

## 2023-09-15 DIAGNOSIS — Z1231 Encounter for screening mammogram for malignant neoplasm of breast: Secondary | ICD-10-CM

## 2023-09-23 ENCOUNTER — Encounter

## 2023-09-23 DIAGNOSIS — Z1231 Encounter for screening mammogram for malignant neoplasm of breast: Secondary | ICD-10-CM

## 2023-10-13 ENCOUNTER — Telehealth: Payer: Self-pay | Admitting: Internal Medicine

## 2023-10-13 NOTE — Telephone Encounter (Signed)
 Called pt, pt will be present at appt.

## 2023-10-18 ENCOUNTER — Encounter: Payer: Self-pay | Admitting: Internal Medicine

## 2023-10-18 ENCOUNTER — Ambulatory Visit: Attending: Internal Medicine | Admitting: Internal Medicine

## 2023-10-18 ENCOUNTER — Telehealth: Payer: Self-pay | Admitting: Pharmacist

## 2023-10-18 ENCOUNTER — Other Ambulatory Visit: Payer: Self-pay

## 2023-10-18 ENCOUNTER — Other Ambulatory Visit: Payer: Self-pay | Admitting: Internal Medicine

## 2023-10-18 VITALS — BP 128/78 | HR 74 | Temp 98.1°F | Ht 59.0 in | Wt 231.0 lb

## 2023-10-18 DIAGNOSIS — E785 Hyperlipidemia, unspecified: Secondary | ICD-10-CM

## 2023-10-18 DIAGNOSIS — I10 Essential (primary) hypertension: Secondary | ICD-10-CM

## 2023-10-18 DIAGNOSIS — Z23 Encounter for immunization: Secondary | ICD-10-CM | POA: Diagnosis not present

## 2023-10-18 DIAGNOSIS — Z79899 Other long term (current) drug therapy: Secondary | ICD-10-CM

## 2023-10-18 DIAGNOSIS — Z6841 Body Mass Index (BMI) 40.0 and over, adult: Secondary | ICD-10-CM

## 2023-10-18 DIAGNOSIS — G5603 Carpal tunnel syndrome, bilateral upper limbs: Secondary | ICD-10-CM | POA: Diagnosis not present

## 2023-10-18 DIAGNOSIS — Z7985 Long-term (current) use of injectable non-insulin antidiabetic drugs: Secondary | ICD-10-CM

## 2023-10-18 DIAGNOSIS — F17200 Nicotine dependence, unspecified, uncomplicated: Secondary | ICD-10-CM | POA: Diagnosis not present

## 2023-10-18 MED ORDER — WEGOVY 1.7 MG/0.75ML ~~LOC~~ SOAJ: 1.7000 mg | SUBCUTANEOUS | 1 refills | Status: AC

## 2023-10-18 MED ORDER — NICOTINE POLACRILEX 2 MG MT GUM: 2.0000 mg | CHEWING_GUM | OROMUCOSAL | 2 refills | Status: AC | PRN

## 2023-10-18 NOTE — Telephone Encounter (Signed)
 I'm sorry to bother you with these GLP-1's for weight loss but I cannot remember how this case would go. She has a Medicaid plan but has Community education officer primary. Are we we able to attempt a PA for the Wegovy ?

## 2023-10-18 NOTE — Patient Instructions (Addendum)
-   We have sent a prescription for nicotine gum. You chew nicotine gum to break up hard coating and hold it on the inside of your cheek. If your insurance does not cover it call the 1800QUITNOW number.  - We will increase wegovy  to 1.7 mg weekly injection after you finish all your 1mg  injections.

## 2023-10-18 NOTE — Progress Notes (Signed)
 Patient ID: Diane [name], female    DOB: April 26, 1975  MRN: [id number]  CC: Hypertension (HTN f/u. Layvonne referral for pain on R wrist /Flu & Hep B vax administered on 10/18/2023 - C.A.)   Subjective: Diane [name] is a 49 y.o. female who presents for chronic ds management Her concerns today include: Patient with history of tob dep, morbid obesity, preDM, HTN, HL, CTS and anxiety   B/l CTS: She continues to have pain in both wrists due to CTS, R>L. Has recently began to drop things after holding them for extended period but maintains sensation in her hands. Requests another referral to ortho-- is curious about corticosteroid injections for symptomatic tx. Ortho previously recommended surgery, but she continues to decline that today, reporting her work schedule is still too hectic.   HTN: BP 128/78 today. Still taking amlodipine  5 mg and hydrochlorothiazide  12.5 mg daily -- took them this morning. Limiting salt.  Obesity: 231 today. Down 13 lbs since 07/15/2023. BMI 46.6. Last A1c was 5.4 in 07/2023. She continues to take the Wegovy  1mg  weekly injection which is still suppressing her appetite. Denies any abdominal pain, nausea, vomiting or diarrhea.  She is attempting to eat veggies and fruit daily and limit her portion sizes. Avoids sodas but drinks 2 small fruit juice punches daily. She is not exercising, but is active at work as a Radiographer, therapeutic.  Tobacco dependence: Still smoking 0.5 cigarettes a day. Knows she needs to quit. Did not like the nicotine patch and reports it may have worsened her hot flashes. Worried about gaining weight after smoking cessation. Is interested in Nicotine gum. Set a quit date today for 03/13/2023.   HL: Continues atorvastatin  20 mg daily  HM Hepatitis B Vaccine- needs 2nd shot today Wants Influenza Vaccine   Patient Active Problem List   Diagnosis Date Noted   Prediabetes 02/18/2022   Tobacco dependence 10/31/2019   Knee  pain, right 08/09/2019   Hyperlipidemia 03/24/2018   Essential hypertension 11/18/2017   Carpal tunnel syndrome of left wrist 11/18/2017   Anxiety 06/27/2012   Preventative health care 12/08/2011   Tobacco abuse 11/02/2011   Morbid obesity (HCC) 11/02/2011     Current Outpatient Medications on File Prior to Visit  Medication Sig Dispense Refill   amLODipine  (NORVASC ) 5 MG tablet Take 1 tablet (5 mg total) by mouth daily. 90 tablet 1   atorvastatin  (LIPITOR) 20 MG tablet Take 1 tablet (20 mg total) by mouth daily. 90 tablet 3   Dextromethorphan-guaiFENesin  (MUCINEX  DM MAXIMUM STRENGTH) 60-1200 MG TB12 Take 1 tablet by mouth 2 (two) times daily. 20 tablet 0   hydrochlorothiazide  (HYDRODIURIL ) 12.5 MG tablet TAKE 1 TABLET BY MOUTH EVERY DAY 90 tablet 2   hydrOXYzine  (VISTARIL ) 25 MG capsule TAKE 1 CAPSULE BY MOUTH EVERYDAY AT BEDTIME 90 capsule 1   phentermine  37.5 MG capsule Take 37.5 mg by mouth daily.     Vitamin D , Ergocalciferol , (DRISDOL ) 1.25 MG (50000 UNIT) CAPS capsule Take 1 capsule (50,000 Units total) by mouth 2 (two) times a week. (Patient not taking: Reported on 10/18/2023) 8 capsule 0   No current facility-administered medications on file prior to visit.    Allergies  Allergen Reactions   Lisinopril  Swelling    Lip swelling    Social History   Socioeconomic History   Marital status: Single    Spouse name: Not on file   Number of children: 4   Years of education: Not on file  Highest education level: Some college, no degree  Occupational History   Occupation: [employer]  Tobacco Use   Smoking status: Every Day    Current packs/day: 0.50    Average packs/day: 0.5 packs/day for 15.0 years (7.5 ttl pk-yrs)    Types: Cigarettes   Smokeless tobacco: Current   Tobacco comments:    pt trying to quit down to 3 -4 cig's daily from 1ppd  Substance and Sexual Activity   Alcohol use: Yes    Comment:  occasionally   Drug use: No   Sexual activity: Yes    Birth  control/protection: Surgical  Other Topics Concern   Not on file  Social History Narrative   [employer]- works from home.     3 children- (80 year old adopted child) 10, 4 and 1.    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)   Single   Lives with sister and children   Social Drivers of Health   Financial Resource Strain: Medium Risk (04/14/2023)   Overall Financial Resource Strain (CARDIA)    Difficulty of Paying Living Expenses: Somewhat hard  Food Insecurity: Food Insecurity Present (04/14/2023)   Hunger Vital Sign    Worried About Running Out of Food in the Last Year: Sometimes true    Ran Out of Food in the Last Year: Sometimes true  Transportation Needs: No Transportation Needs (04/14/2023)   PRAPARE - Administrator, Civil Service (Medical): No    Lack of Transportation (Non-Medical): No  Physical Activity: Insufficiently Active (04/14/2023)   Exercise Vital Sign    Days of Exercise per Week: 2 days    Minutes of Exercise per Session: 20 min  Stress: No Stress Concern Present (04/14/2023)   [employer] of Occupational Health - Occupational Stress Questionnaire    Feeling of Stress : Only a little  Social Connections: Moderately Integrated (04/14/2023)   Social Connection and Isolation Panel    Frequency of Communication with Friends and Family: More than three times a week    Frequency of Social Gatherings with Friends and Family: Twice a week    Attends Religious Services: 1 to 4 times per year    Active Member of [employer] or Organizations: Yes    Attends Engineer, structural: More than 4 times per year    Marital Status: Never married  Intimate Partner Violence: Not At Risk (12/31/2022)   Humiliation, Afraid, Rape, and Kick questionnaire    Fear of Current or Ex-Partner: No    Emotionally Abused: No    Physically Abused: No    Sexually Abused: No    Family History  Problem Relation Age of Onset   Hyperlipidemia Mother    Cancer Mother         throat cancer   Hypertension Mother    Throat cancer Mother    Anxiety disorder Mother    Bipolar disorder Mother    Alcoholism Mother    Drug abuse Mother    Alcoholism Father    Hyperlipidemia Father    Hypertension Father    Heart disease Father 18   Stroke Father    Obesity Father    Diabetes Maternal Grandmother    Cancer Maternal Aunt        breast cancer   Cancer Maternal Aunt        breast cancer--in remision   Breast cancer Maternal Aunt    Pancreatic cancer Maternal Aunt    Cancer Maternal Uncle  prostate   Colon cancer Maternal Uncle    Cancer Maternal Uncle        throat cancer   Kidney disease Neg Hx     Past Surgical History:  Procedure Laterality Date   CESAREAN SECTION  02-04-2010;  11-30-2006;  07-12-2001   DILITATION & CURRETTAGE/HYSTROSCOPY WITH HYDROTHERMAL ABLATION N/A 10/01/2016   Procedure: DILATATION & CURETTAGE/HYSTEROSCOPY WITH HYDROTHERMAL ABLATION;  Surgeon: Mat Browning, MD;  Location: [facility];  Service: Gynecology;  Laterality: N/A;   LAPAROSCOPIC TUBAL LIGATION Bilateral 03/02/2014   Procedure: BILATERAL LAPAROSCOPIC TUBAL LIGATION;  Surgeon: Browning LITTIE Mat, MD;  Location: [facility];  Service: Gynecology;  Laterality: Bilateral;   TUBAL LIGATION  2014   WISDOM TOOTH EXTRACTION      ROS: Review of Systems Negative except as stated above  PHYSICAL EXAM: BP 128/78 (BP Location: Left Arm, Patient Position: Sitting, Cuff Size: Normal)   Pulse 74   Temp 98.1 F (36.7 C) (Oral)   Ht 4' 11 (1.499 m)   Wt 231 lb (104.8 kg)   SpO2 100%   BMI 46.66 kg/m   Physical Exam  General appearance - alert, well appearing, obese AAF in no distress Mental status - normal mood, behavior, speech, dress, motor activity, and thought processes Chest - clear to auscultation, no wheezes, rales or rhonchi, symmetric air entry Heart - normal rate, regular rhythm, normal S1, S2, no murmurs, rubs, clicks or gallops Extremities - no  pedal edema noted      Latest Ref Rng & Units 12/31/2022    4:45 PM 06/22/2022    4:02 PM 03/19/2022    3:20 PM  CMP  Glucose 70 - 99 mg/dL 97   82   BUN 6 - 24 mg/dL 12   12   Creatinine 9.42 - 1.00 mg/dL 9.35   9.32   Sodium 865 - 144 mmol/L 140   140   Potassium 3.5 - 5.2 mmol/L 4.3   3.4   Chloride 96 - 106 mmol/L 101   100   CO2 20 - 29 mmol/L 23   23   Calcium  8.7 - 10.2 mg/dL 9.5   9.2   Total Protein 6.0 - 8.5 g/dL 7.9  7.6    Total Bilirubin 0.0 - 1.2 mg/dL 0.2  0.2    Alkaline Phos 44 - 121 IU/L 84  68    AST 0 - 40 IU/L 18  16    ALT 0 - 32 IU/L 15  10     Lipid Panel     Component Value Date/Time   CHOL 168 12/31/2022 1645   TRIG 55 12/31/2022 1645   HDL 65 12/31/2022 1645   CHOLHDL 2.6 12/31/2022 1645   CHOLHDL 4.7 11/02/2011 1051   VLDL 16 11/02/2011 1051   LDLCALC 92 12/31/2022 1645    CBC    Component Value Date/Time   WBC 4.8 12/31/2022 1645   WBC 8.0 02/16/2018 1829   RBC 4.52 12/31/2022 1645   RBC 4.73 02/16/2018 1829   HGB 13.5 12/31/2022 1645   HCT 42.6 12/31/2022 1645   PLT 324 12/31/2022 1645   MCV 94 12/31/2022 1645   MCH 29.9 12/31/2022 1645   MCH 29.8 02/16/2018 1829   MCHC 31.7 12/31/2022 1645   MCHC 32.3 02/16/2018 1829   RDW 13.0 12/31/2022 1645   LYMPHSABS 1.3 02/06/2021 0930   MONOABS 0.3 02/16/2018 1829   EOSABS 0.1 02/06/2021 0930   BASOSABS 0.0 02/06/2021 0930    ASSESSMENT AND  PLAN:  Assessment and Plan  1. Essential hypertension (Primary) At goal. Continue amlodipine  5 mg daily and HCTZ 12.5 mg daily.  2. Morbid obesity (HCC) She has had substantial weight loss on the Wegovy  1 mg weekly injection without issues. We will increase to 1.7 mg weekly today. and increase to 2.4 mg weekly in one month if she continues to tolerate it without issues. Discussed and encouraged healthy eating habits. Advised her to cut back to from 2 to 1 juice a day to decrease calorie consumption.  - semaglutide -weight management (WEGOVY ) 1.7  MG/0.75ML SOAJ SQ injection; Inject 1.7 mg into the skin once a week.  Dispense: 3 mL; Refill: 1  3. Tobacco dependence Advised smoking cessation. She wants to try nicotine gum today and set a quit date for 03/13/2023. Sent nicotine gum prescription and provided advice on how to chew it properly. Gave her the 1-800-QUIT-NOW number and advised her to reach out to them for counseling or if her insurance does not cover the nicotine gum. Informed her that the gum will have a similar appetite suppressing effect as cigarettes because it delivers the same active ingredient. - nicotine polacrilex (NICORETTE) 2 MG gum; Take 1 each (2 mg total) by mouth as needed for smoking cessation. Max is 30 pieces/24 hr  Dispense: 100 tablet; Refill: 2  4. Carpal tunnel syndrome, bilateral She wants steroid injections for CTS but is still uninterested in surgery due to her work schedule. Will send another referral to Dr. Jerri. - AMB referral to orthopedics  5. Need for immunization against influenza - Flu vaccine trivalent PF, 6mos and older(Flulaval,Afluria,Fluarix,Fluzone)  6. Need for hepatitis B vaccination Due for dose 2/2 today. - Second shot given today  7. Hyperlipidemia.  Continue atorvastatin  20mg  daily.  Patient was given the opportunity to ask questions.  Patient verbalized understanding of the plan and was able to repeat key elements of the plan.    Orders Placed This Encounter  Procedures   Flu vaccine trivalent PF, 6mos and older(Flulaval,Afluria,Fluarix,Fluzone)   Heplisav-B  (HepB-CPG) Vaccine   AMB referral to orthopedics     Requested Prescriptions   Signed Prescriptions Disp Refills   semaglutide -weight management (WEGOVY ) 1.7 MG/0.75ML SOAJ SQ injection 3 mL 1    Sig: Inject 1.7 mg into the skin once a week.   nicotine polacrilex (NICORETTE) 2 MG gum 100 tablet 2    Sig: Take 1 each (2 mg total) by mouth as needed for smoking cessation. Max is 30 pieces/24 hr    Return in about 3  months (around 01/18/2024).  This is a Psychologist, occupational Note.  The care of the patient was discussed with Dr. Vicci and the assessment and plan formulated with her assistance.  Please see her attestation of this encounter.  Duwaine Amber, MS3 [facility]   Evaluation and management procedures were performed by me with Medical Student in attendance, note written by Medical Student under my supervision and collaboration. I have reviewed the note and I agree with the management and plan.   Barnie Vicci, MD, FAAFP. [facility] [place], [place] [phone]   10/18/2023, 12:32 PM

## 2023-10-19 ENCOUNTER — Other Ambulatory Visit: Payer: Self-pay | Admitting: Internal Medicine

## 2023-10-21 ENCOUNTER — Encounter: Payer: Self-pay | Admitting: Internal Medicine

## 2023-10-22 ENCOUNTER — Other Ambulatory Visit: Payer: Self-pay | Admitting: Internal Medicine

## 2023-10-22 MED ORDER — PHENTERMINE HCL 37.5 MG PO CAPS
37.5000 mg | ORAL_CAPSULE | Freq: Every day | ORAL | 1 refills | Status: AC
Start: 2023-10-22 — End: ?

## 2023-11-09 ENCOUNTER — Ambulatory Visit: Admitting: Orthopaedic Surgery

## 2023-11-09 DIAGNOSIS — G5603 Carpal tunnel syndrome, bilateral upper limbs: Secondary | ICD-10-CM | POA: Diagnosis not present

## 2023-11-09 DIAGNOSIS — G5601 Carpal tunnel syndrome, right upper limb: Secondary | ICD-10-CM | POA: Diagnosis not present

## 2023-11-09 DIAGNOSIS — G5602 Carpal tunnel syndrome, left upper limb: Secondary | ICD-10-CM | POA: Diagnosis not present

## 2023-11-09 MED ORDER — METHYLPREDNISOLONE ACETATE 40 MG/ML IJ SUSP
40.0000 mg | INTRAMUSCULAR | Status: AC | PRN
  Administered 2023-11-09: 40 mg

## 2023-11-09 MED ORDER — BUPIVACAINE HCL 0.5 % IJ SOLN
1.0000 mL | INTRAMUSCULAR | Status: AC | PRN
  Administered 2023-11-09: 1 mL

## 2023-11-09 MED ORDER — LIDOCAINE HCL 1 % IJ SOLN
1.0000 mL | INTRAMUSCULAR | Status: AC | PRN
  Administered 2023-11-09: 1 mL

## 2023-11-09 NOTE — Progress Notes (Signed)
 Office Visit Note   Patient: Diane [name]           Date of Birth: May 25, 1975           MRN: [id number] Visit Date: 11/09/2023              Requested by: Vicci Barnie NOVAK, MD [address] [place] [address] [place],  [place] [address] PCP: Vicci Barnie NOVAK, MD   Assessment & Plan: Visit Diagnoses:  1. Right carpal tunnel syndrome   2. Left carpal tunnel syndrome     Plan: History of Present Illness Diane [name] is a 48 year old female with moderate to severe carpal tunnel syndrome who presents for follow-up and management of her condition.  She experiences significant problems with both hands due to carpal tunnel syndrome, affecting her daily activities. She is not currently working, which influences her treatment decisions.  The braces she uses do not provide symptom relief. She is considering cortisone injections as a temporary measure while evaluating surgical options. She expresses concern about the pain and temporary numbness from the injections but decides to proceed with bilateral injections due to significant bilateral hand problems.  She inquires about her ability to drive after receiving the injections.  Results DIAGNOSTIC Nerve conduction studies: Moderate to severe neuropathy  Assessment and Plan Bilateral carpal tunnel syndrome Moderate to severe confirmed by nerve conduction studies. Braces ineffective. Discussed recovery concerns due to her role as a single mother. - Administer bilateral cortisone injections. Expect temporary numbness for 4-5 hours post-injection. - Recommend surgical intervention due to severity. Outpatient procedure with a three-week recovery.  Follow-Up Instructions: No follow-ups on file.   Orders:  No orders of the defined types were placed in this encounter.  No orders of the defined types were placed in this encounter.     Procedures: Hand/UE Inj: bilateral carpal tunnel for carpal tunnel syndrome on 11/09/2023 11:01  AM Indications: pain Details: 25 G needle Medications (Right): 1 mL lidocaine  1 %; 1 mL bupivacaine  0.5 %; 40 mg methylPREDNISolone  acetate 40 MG/ML Medications (Left): 1 mL lidocaine  1 %; 1 mL bupivacaine  0.5 %; 40 mg methylPREDNISolone  acetate 40 MG/ML Outcome: tolerated well, no immediate complications Patient was prepped and draped in the usual sterile fashion.       Clinical Data: No additional findings.   Subjective: Chief Complaint  Patient presents with   Right Hand - Pain   Left Hand - Pain    HPI  Review of Systems  Constitutional: Negative.   HENT: Negative.    Eyes: Negative.   Respiratory: Negative.    Cardiovascular: Negative.   Endocrine: Negative.   Musculoskeletal: Negative.   Neurological: Negative.   Hematological: Negative.   Psychiatric/Behavioral: Negative.    All other systems reviewed and are negative.    Objective: Vital Signs: There were no vitals taken for this visit.  Physical Exam Vitals and nursing note reviewed.  Constitutional:      Appearance: She is well-developed.  HENT:     Head: Atraumatic.     Nose: Nose normal.  Eyes:     Extraocular Movements: Extraocular movements intact.  Cardiovascular:     Pulses: Normal pulses.  Pulmonary:     Effort: Pulmonary effort is normal.  Abdominal:     Palpations: Abdomen is soft.  Musculoskeletal:     Cervical back: Neck supple.  Skin:    General: Skin is warm.     Capillary Refill: Capillary refill takes less than 2  seconds.  Neurological:     Mental Status: She is alert. Mental status is at baseline.  Psychiatric:        Behavior: Behavior normal.        Thought Content: Thought content normal.        Judgment: Judgment normal.     Ortho Exam  Specialty Comments:  No specialty comments available.  Imaging: No results found.   PMFS History: Patient Active Problem List   Diagnosis Date Noted   Prediabetes 02/18/2022   Tobacco dependence 10/31/2019   Knee pain,  right 08/09/2019   Hyperlipidemia 03/24/2018   Essential hypertension 11/18/2017   Carpal tunnel syndrome of left wrist 11/18/2017   Anxiety 06/27/2012   Preventative health care 12/08/2011   Tobacco abuse 11/02/2011   Morbid obesity (HCC) 11/02/2011   Past Medical History:  Diagnosis Date   Anxiety    Back pain    Cellulitis    Constipation    Elevated cholesterol    Hypertension    Joint pain    Overweight     Family History  Problem Relation Age of Onset   Hyperlipidemia Mother    Cancer Mother        throat cancer   Hypertension Mother    Throat cancer Mother    Anxiety disorder Mother    Bipolar disorder Mother    Alcoholism Mother    Drug abuse Mother    Alcoholism Father    Hyperlipidemia Father    Hypertension Father    Heart disease Father 61   Stroke Father    Obesity Father    Diabetes Maternal Grandmother    Cancer Maternal Aunt        breast cancer   Cancer Maternal Aunt        breast cancer--in remision   Breast cancer Maternal Aunt    Pancreatic cancer Maternal Aunt    Cancer Maternal Uncle        prostate   Colon cancer Maternal Uncle    Cancer Maternal Uncle        throat cancer   Kidney disease Neg Hx     Past Surgical History:  Procedure Laterality Date   CESAREAN SECTION  02-04-2010;  11-30-2006;  07-12-2001   DILITATION & CURRETTAGE/HYSTROSCOPY WITH HYDROTHERMAL ABLATION N/A 10/01/2016   Procedure: DILATATION & CURETTAGE/HYSTEROSCOPY WITH HYDROTHERMAL ABLATION;  Surgeon: Mat Browning, MD;  Location: [facility];  Service: Gynecology;  Laterality: N/A;   LAPAROSCOPIC TUBAL LIGATION Bilateral 03/02/2014   Procedure: BILATERAL LAPAROSCOPIC TUBAL LIGATION;  Surgeon: Browning LITTIE Mat, MD;  Location: [facility];  Service: Gynecology;  Laterality: Bilateral;   TUBAL LIGATION  2014   WISDOM TOOTH EXTRACTION     Social History   Occupational History   Occupation: [employer]  Tobacco Use   Smoking status: Every Day    Current  packs/day: 0.50    Average packs/day: 0.5 packs/day for 15.0 years (7.5 ttl pk-yrs)    Types: Cigarettes   Smokeless tobacco: Current   Tobacco comments:    pt trying to quit down to 3 -4 cig's daily from 1ppd  Substance and Sexual Activity   Alcohol use: Yes    Comment:  occasionally   Drug use: No   Sexual activity: Yes    Birth control/protection: Surgical

## 2023-11-15 ENCOUNTER — Encounter: Payer: Self-pay | Admitting: Radiology

## 2023-11-29 ENCOUNTER — Encounter: Payer: Self-pay | Admitting: Internal Medicine

## 2023-12-07 ENCOUNTER — Ambulatory Visit
Admission: RE | Admit: 2023-12-07 | Discharge: 2023-12-07 | Disposition: A | Discharge status: Home / Self Care | Source: Ambulatory Visit | Attending: Internal Medicine | Admitting: Internal Medicine

## 2023-12-07 DIAGNOSIS — Z1231 Encounter for screening mammogram for malignant neoplasm of breast: Secondary | ICD-10-CM

## 2023-12-13 ENCOUNTER — Ambulatory Visit: Payer: Self-pay | Admitting: Internal Medicine

## 2023-12-20 ENCOUNTER — Ambulatory Visit: Attending: Internal Medicine | Admitting: Internal Medicine

## 2023-12-20 ENCOUNTER — Encounter: Payer: Self-pay | Admitting: Internal Medicine

## 2023-12-20 DIAGNOSIS — J029 Acute pharyngitis, unspecified: Secondary | ICD-10-CM | POA: Diagnosis not present

## 2023-12-20 MED ORDER — TOPIRAMATE 25 MG PO TABS: 25.0000 mg | ORAL_TABLET | Freq: Every evening | ORAL | 2 refills | Status: AC | PRN

## 2023-12-20 NOTE — Patient Instructions (Signed)
  VISIT SUMMARY: Today, we discussed your concerns about weight management, especially after stopping Wegovy  due to insurance issues. We also reviewed your recent experience with a sore throat and a sensation of a lump in your throat, which has since resolved.  YOUR PLAN: -MORBID OBESITY: Morbid obesity means having a body mass index (BMI) of 40 or higher, which can lead to various health issues. Since stopping Wegovy , you have gained six pounds and found phentermine  less effective. To help with weight loss, I have prescribed topiramate  25 mg to be taken daily at bedtime, in addition to continuing phentermine  37.5 mg. Please monitor for any side effects of topiramate , such as tingling in your hands or feet.  INSTRUCTIONS: Please continue taking phentermine  37.5 mg and start taking topiramate  25 mg daily at bedtime. Monitor for any side effects, especially tingling in your hands or feet. We will follow up at the end of January or early February to assess your progress and make any necessary adjustments.                      Contains text generated by [employer]bridge.                                 Contains text generated by [employer]bridge.

## 2023-12-20 NOTE — Progress Notes (Signed)
 Patient ID: Diane [name], female    DOB: Jan 19, 1975  MRN: [id number]  CC: Mass (Reports sore throat that has now resolved - felt a lump in her throat (possibly swollen lymph nodes)/Already received flu vax)   Subjective: Diane [name] is a 48 y.o. female who presents for chronic ds management. Her concerns today include:  Patient with history of tob dep, morbid obesity, preDM, HTN, HL, CTS and anxiety   Discussed the use of AI scribe software for clinical note transcription with the patient, who gave verbal consent to proceed.  History of Present Illness Diane [name] is a 48 year old female who presents with concerns about weight management.  She has been managing her weight with medication and was previously on Wegovy , which was effective. However, her insurance stopped covering it as of October 1st, leading to frustration as she was doing well on the medication. Since stopping Wegovy , she has gained approximately six pounds. We put her back on Phentermine . She is currently taking phentermine  37.5 mg but feels it is not as effective as Wegovy . She has been on phentermine  since October. 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 She is mindful of the challenges of maintaining her weight during the holiday season.   She experienced a sore throat and a sensation of a lump in her throat around November 17th. She describes waking up with a 'funny feeling' in her throat, which felt like a lump, soreness of the throat and accompanied by mild difficulty swallowing. These symptoms resolved within a day or so, and she no longer experiences any discomfort or difficulty swallowing.      Patient Active Problem List   Diagnosis Date Noted   Prediabetes 02/18/2022   Tobacco dependence 10/31/2019   Knee pain, right 08/09/2019   Hyperlipidemia 03/24/2018   Essential hypertension 11/18/2017   Carpal  tunnel syndrome of left wrist 11/18/2017   Anxiety 06/27/2012   Preventative health care 12/08/2011   Tobacco abuse 11/02/2011   Morbid obesity (HCC) 11/02/2011     Current Outpatient Medications on File Prior to Visit  Medication Sig Dispense Refill   amLODipine  (NORVASC ) 5 MG tablet Take 1 tablet (5 mg total) by mouth daily. 90 tablet 1   atorvastatin  (LIPITOR) 20 MG tablet Take 1 tablet (20 mg total) by mouth daily. 90 tablet 3   Dextromethorphan-guaiFENesin  (MUCINEX  DM MAXIMUM STRENGTH) 60-1200 MG TB12 Take 1 tablet by mouth 2 (two) times daily. 20 tablet 0   hydrochlorothiazide  (HYDRODIURIL ) 12.5 MG tablet TAKE 1 TABLET BY MOUTH EVERY DAY 90 tablet 2   hydrOXYzine  (VISTARIL ) 25 MG capsule TAKE 1 CAPSULE BY MOUTH EVERYDAY AT BEDTIME 90 capsule 1   nicotine  polacrilex (NICORETTE ) 2 MG gum Take 1 each (2 mg total) by mouth as needed for smoking cessation. Max is 30 pieces/24 hr 100 tablet 2   phentermine  37.5 MG capsule Take 1 capsule (37.5 mg total) by mouth daily. 30 capsule 1   semaglutide -weight management (WEGOVY ) 1.7 MG/0.75ML SOAJ SQ injection Inject 1.7 mg into the skin once a week. 3 mL 1   Vitamin D , Ergocalciferol , (DRISDOL ) 1.25 MG (50000 UNIT) CAPS capsule Take 1 capsule (50,000 Units total) by mouth 2 (two) times a week. (Patient not taking: Reported on 12/20/2023) 8 capsule 0   No current facility-administered medications on file prior to visit.    Allergies  Allergen Reactions   Lisinopril  Swelling  Lip swelling    Social History   Socioeconomic History   Marital status: Single    Spouse name: Not on file   Number of children: 4   Years of education: Not on file   Highest education level: Some college, no degree  Occupational History   Occupation: [employer]  Tobacco Use   Smoking status: Every Day    Current packs/day: 0.50    Average packs/day: 0.5 packs/day for 15.0 years (7.5 ttl pk-yrs)    Types: Cigarettes   Smokeless tobacco: Current   Tobacco comments:     pt trying to quit down to 3 -4 cig's daily from 1ppd  Substance and Sexual Activity   Alcohol use: Yes    Comment:  occasionally   Drug use: No   Sexual activity: Yes    Birth control/protection: Surgical  Other Topics Concern   Not on file  Social History Narrative   [employer]- works from home.     3 children- (48 year old adopted child) 10, 4 and 1.    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)   Single   Lives with sister and children   Social Drivers of Health   Financial Resource Strain: Medium Risk (04/14/2023)   Overall Financial Resource Strain (CARDIA)    Difficulty of Paying Living Expenses: Somewhat hard  Food Insecurity: Food Insecurity Present (04/14/2023)   Hunger Vital Sign    Worried About Running Out of Food in the Last Year: Sometimes true    Ran Out of Food in the Last Year: Sometimes true  Transportation Needs: No Transportation Needs (04/14/2023)   PRAPARE - Administrator, Civil Service (Medical): No    Lack of Transportation (Non-Medical): No  Physical Activity: Insufficiently Active (04/14/2023)   Exercise Vital Sign    Days of Exercise per Week: 2 days    Minutes of Exercise per Session: 20 min  Stress: No Stress Concern Present (04/14/2023)   [employer] of Occupational Health - Occupational Stress Questionnaire    Feeling of Stress : Only a little  Social Connections: Moderately Integrated (04/14/2023)   Social Connection and Isolation Panel    Frequency of Communication with Friends and Family: More than three times a week    Frequency of Social Gatherings with Friends and Family: Twice a week    Attends Religious Services: 1 to 4 times per year    Active Member of [employer] or Organizations: Yes    Attends Engineer, Structural: More than 4 times per year    Marital Status: Never married  Intimate Partner Violence: Not At Risk (12/31/2022)   Humiliation, Afraid, Rape, and Kick questionnaire    Fear of Current or  Ex-Partner: No    Emotionally Abused: No    Physically Abused: No    Sexually Abused: No    Family History  Problem Relation Age of Onset   Hyperlipidemia Mother    Cancer Mother        throat cancer   Hypertension Mother    Throat cancer Mother    Anxiety disorder Mother    Bipolar disorder Mother    Alcoholism Mother    Drug abuse Mother    Alcoholism Father    Hyperlipidemia Father    Hypertension Father    Heart disease Father 76   Stroke Father    Obesity Father    Diabetes Maternal Grandmother    Cancer Maternal Aunt        breast cancer  Cancer Maternal Aunt        breast cancer--in remision   Breast cancer Maternal Aunt    Pancreatic cancer Maternal Aunt    Cancer Maternal Uncle        prostate   Colon cancer Maternal Uncle    Cancer Maternal Uncle        throat cancer   Kidney disease Neg Hx     Past Surgical History:  Procedure Laterality Date   CESAREAN SECTION  02-04-2010;  11-30-2006;  07-12-2001   DILITATION & CURRETTAGE/HYSTROSCOPY WITH HYDROTHERMAL ABLATION N/A 10/01/2016   Procedure: DILATATION & CURETTAGE/HYSTEROSCOPY WITH HYDROTHERMAL ABLATION;  Surgeon: Mat Browning, MD;  Location: [facility];  Service: Gynecology;  Laterality: N/A;   LAPAROSCOPIC TUBAL LIGATION Bilateral 03/02/2014   Procedure: BILATERAL LAPAROSCOPIC TUBAL LIGATION;  Surgeon: Browning LITTIE Mat, MD;  Location: [facility];  Service: Gynecology;  Laterality: Bilateral;   TUBAL LIGATION  2014   WISDOM TOOTH EXTRACTION      ROS: Review of Systems Negative except as stated above  PHYSICAL EXAM: BP 106/71 (BP Location: Left Arm, Patient Position: Sitting, Cuff Size: Large)   Pulse 79   Temp 98.3 F (36.8 C) (Oral)   Ht 4' 11 (1.499 m)   Wt 237 lb (107.5 kg)   SpO2 99%   BMI 47.87 kg/m   Wt Readings from Last 3 Encounters:  12/20/23 237 lb (107.5 kg)  10/18/23 231 lb (104.8 kg)  07/15/23 244 lb (110.7 kg)    Physical Exam  General appearance - alert,  well appearing, morbidly obese AAF and in no distress Mental status - normal mood, behavior, speech, dress, motor activity, and thought processes Mouth - mucous membranes moist, pharynx normal without lesions Neck - supple, no significant adenopathy, no thyromegaly or thyroid nodules      Latest Ref Rng & Units 12/31/2022    4:45 PM 06/22/2022    4:02 PM 03/19/2022    3:20 PM  CMP  Glucose 70 - 99 mg/dL 97   82   BUN 6 - 24 mg/dL 12   12   Creatinine 9.42 - 1.00 mg/dL 9.35   9.32   Sodium 865 - 144 mmol/L 140   140   Potassium 3.5 - 5.2 mmol/L 4.3   3.4   Chloride 96 - 106 mmol/L 101   100   CO2 20 - 29 mmol/L 23   23   Calcium  8.7 - 10.2 mg/dL 9.5   9.2   Total Protein 6.0 - 8.5 g/dL 7.9  7.6    Total Bilirubin 0.0 - 1.2 mg/dL 0.2  0.2    Alkaline Phos 44 - 121 IU/L 84  68    AST 0 - 40 IU/L 18  16    ALT 0 - 32 IU/L 15  10     Lipid Panel     Component Value Date/Time   CHOL 168 12/31/2022 1645   TRIG 55 12/31/2022 1645   HDL 65 12/31/2022 1645   CHOLHDL 2.6 12/31/2022 1645   CHOLHDL 4.7 11/02/2011 1051   VLDL 16 11/02/2011 1051   LDLCALC 92 12/31/2022 1645    CBC    Component Value Date/Time   WBC 4.8 12/31/2022 1645   WBC 8.0 02/16/2018 1829   RBC 4.52 12/31/2022 1645   RBC 4.73 02/16/2018 1829   HGB 13.5 12/31/2022 1645   HCT 42.6 12/31/2022 1645   PLT 324 12/31/2022 1645   MCV 94 12/31/2022 1645   MCH 29.9 12/31/2022  1645   MCH 29.8 02/16/2018 1829   MCHC 31.7 12/31/2022 1645   MCHC 32.3 02/16/2018 1829   RDW 13.0 12/31/2022 1645   LYMPHSABS 1.3 02/06/2021 0930   MONOABS 0.3 02/16/2018 1829   EOSABS 0.1 02/06/2021 0930   BASOSABS 0.0 02/06/2021 0930    ASSESSMENT AND PLAN: 1. Morbid obesity (HCC) (Primary) Management complicated by insurance issues for Wegovy . Weight gain of six pounds since discontinuation and does not meet criteria to get approve for Wegovy  or Zepound. Phentermine  less effective. Discussed adding topiramate  to enhance weight loss. No  glaucoma history. Explained potential side effects of topiramate . - Prescribed topiramate  25 mg daily at bedtime, 30-day supply, two refills. - Continue phentermine  37.5 mg. - Monitor for topiramate  side effects, including tingling in hands or feet; stop med and let me know if this occurs. No hx of glaucoma.  2. Sore throat resolved    Patient was given the opportunity to ask questions.  Patient verbalized understanding of the plan and was able to repeat key elements of the plan.   This documentation was completed using Paediatric nurse.  Any transcriptional errors are unintentional.  No orders of the defined types were placed in this encounter.    Requested Prescriptions   Signed Prescriptions Disp Refills   topiramate  (TOPAMAX ) 25 MG tablet 30 tablet 2    Sig: Take 1 tablet (25 mg total) by mouth at bedtime as needed.    Return for please cancel appt 01/18/24 and move to end of Jan, or early Fed.SABRA Barnie Louder, MD, FACP

## 2023-12-28 ENCOUNTER — Other Ambulatory Visit: Payer: Self-pay | Admitting: Internal Medicine

## 2024-01-18 ENCOUNTER — Ambulatory Visit: Admitting: Internal Medicine

## 2024-02-10 ENCOUNTER — Ambulatory Visit (INDEPENDENT_AMBULATORY_CARE_PROVIDER_SITE_OTHER): Admitting: Physician Assistant

## 2024-02-10 DIAGNOSIS — G5603 Carpal tunnel syndrome, bilateral upper limbs: Secondary | ICD-10-CM

## 2024-02-10 DIAGNOSIS — G5602 Carpal tunnel syndrome, left upper limb: Secondary | ICD-10-CM

## 2024-02-10 DIAGNOSIS — G5601 Carpal tunnel syndrome, right upper limb: Secondary | ICD-10-CM

## 2024-02-10 MED ORDER — BUPIVACAINE HCL 0.25 % IJ SOLN
0.3300 mL | INTRAMUSCULAR | Status: AC | PRN
  Administered 2024-02-10: .33 mL

## 2024-02-10 MED ORDER — METHYLPREDNISOLONE ACETATE 40 MG/ML IJ SUSP
40.0000 mg | INTRAMUSCULAR | Status: AC | PRN
  Administered 2024-02-10: 40 mg

## 2024-02-10 MED ORDER — LIDOCAINE HCL 1 % IJ SOLN
1.0000 mL | INTRAMUSCULAR | Status: AC | PRN
  Administered 2024-02-10: 1 mL

## 2024-02-10 NOTE — Progress Notes (Signed)
 "  Office Visit Note   Patient: Diane [name]           Date of Birth: 12/11/1975           MRN: [id number] Visit Date: 02/10/2024              Requested by: Vicci Barnie NOVAK, MD [address] [place],  [place] [address] PCP: Vicci Barnie NOVAK, MD   Assessment & Plan: Visit Diagnoses:  1. Right carpal tunnel syndrome   2. Left carpal tunnel syndrome     Plan: Impression is bilateral carpal tunnel syndrome moderate to severe compression from 2024.  We have again discussed various treatment options to include repeat cortisone injection versus surgical intervention which I have recommended.  She is not quite ready to proceed with surgery but I did explain this in detail.  She would like to repeat injections today which we will proceed.  She will follow-up as needed.  Call with concerns or questions.  Follow-Up Instructions: Return if symptoms worsen or fail to improve.   Orders:  Orders Placed This Encounter  Procedures   Hand/UE Inj: bilateral carpal tunnel   No orders of the defined types were placed in this encounter.     Procedures: Hand/UE Inj: bilateral carpal tunnel for carpal tunnel syndrome on 02/10/2024 9:55 AM Indications: pain Details: 25 G needle, volar approach Medications (Right): 1 mL lidocaine  1 %; 40 mg methylPREDNISolone  acetate 40 MG/ML; 0.33 mL bupivacaine  0.25 % Medications (Left): 40 mg methylPREDNISolone  acetate 40 MG/ML; 1 mL lidocaine  1 %; 0.33 mL bupivacaine  0.25 %      Clinical Data: No additional findings.   Subjective: Chief Complaint  Patient presents with   Left Wrist - Pain   Right Wrist - Pain    HPI patient is a very pleasant 49 year old female who comes in with recurrent bilateral hand paresthesias.  She has a history of moderate to severe carpal tunnel syndrome to both hands seen on nerve conduction study back in 2024.  She has undergone cortisone injections in the past with the last ones being 11/09/2023.  She had  great relief until recently.  Not interested in proceeding with surgery yet but would like repeat injections today.  Review of Systems as detailed in HPI.  All others reviewed and are negative.   Objective: Vital Signs: There were no vitals taken for this visit.  Physical Exam well-developed well-nourished female in no acute distress.  Alert and oriented x 3.  Ortho Exam unchanged bilateral hand exam  Specialty Comments:  No specialty comments available.  Imaging: No new imaging   PMFS History: Patient Active Problem List   Diagnosis Date Noted   Prediabetes 02/18/2022   Tobacco dependence 10/31/2019   Knee pain, right 08/09/2019   Hyperlipidemia 03/24/2018   Essential hypertension 11/18/2017   Carpal tunnel syndrome of left wrist 11/18/2017   Anxiety 06/27/2012   Preventative health care 12/08/2011   Tobacco abuse 11/02/2011   Morbid obesity (HCC) 11/02/2011   Past Medical History:  Diagnosis Date   Anxiety    Back pain    Cellulitis    Constipation    Elevated cholesterol    Hypertension    Joint pain    Overweight     Family History  Problem Relation Age of Onset   Hyperlipidemia Mother    Cancer Mother        throat cancer   Hypertension Mother    Throat cancer Mother  Anxiety disorder Mother    Bipolar disorder Mother    Alcoholism Mother    Drug abuse Mother    Alcoholism Father    Hyperlipidemia Father    Hypertension Father    Heart disease Father 79   Stroke Father    Obesity Father    Diabetes Maternal Grandmother    Cancer Maternal Aunt        breast cancer   Cancer Maternal Aunt        breast cancer--in remision   Breast cancer Maternal Aunt    Pancreatic cancer Maternal Aunt    Cancer Maternal Uncle        prostate   Colon cancer Maternal Uncle    Cancer Maternal Uncle        throat cancer   Kidney disease Neg Hx     Past Surgical History:  Procedure Laterality Date   CESAREAN SECTION  02-04-2010;  11-30-2006;  07-12-2001    DILITATION & CURRETTAGE/HYSTROSCOPY WITH HYDROTHERMAL ABLATION N/A 10/01/2016   Procedure: DILATATION & CURETTAGE/HYSTEROSCOPY WITH HYDROTHERMAL ABLATION;  Surgeon: Mat Browning, MD;  Location: [facility];  Service: Gynecology;  Laterality: N/A;   LAPAROSCOPIC TUBAL LIGATION Bilateral 03/02/2014   Procedure: BILATERAL LAPAROSCOPIC TUBAL LIGATION;  Surgeon: Browning LITTIE Mat, MD;  Location: [facility];  Service: Gynecology;  Laterality: Bilateral;   TUBAL LIGATION  2014   WISDOM TOOTH EXTRACTION     Social History   Occupational History   Occupation: [employer]  Tobacco Use   Smoking status: Every Day    Current packs/day: 0.50    Average packs/day: 0.5 packs/day for 15.0 years (7.5 ttl pk-yrs)    Types: Cigarettes   Smokeless tobacco: Current   Tobacco comments:    pt trying to quit down to 3 -4 cig's daily from 1ppd  Substance and Sexual Activity   Alcohol use: Yes    Comment:  occasionally   Drug use: No   Sexual activity: Yes    Birth control/protection: Surgical        "

## 2024-02-14 ENCOUNTER — Ambulatory Visit: Admitting: Internal Medicine

## 2024-02-14 ENCOUNTER — Telehealth: Payer: Self-pay | Admitting: Internal Medicine
